# Patient Record
Sex: Female | Born: 1955
Health system: Southern US, Community
[De-identification: ages and names within clinical notes are randomized; demographics above are authoritative.]

## PROBLEM LIST (undated history)

## (undated) DIAGNOSIS — R112 Nausea with vomiting, unspecified: Secondary | ICD-10-CM

## (undated) DIAGNOSIS — Z803 Family history of malignant neoplasm of breast: Secondary | ICD-10-CM

## (undated) DIAGNOSIS — L409 Psoriasis, unspecified: Secondary | ICD-10-CM

## (undated) DIAGNOSIS — Z973 Presence of spectacles and contact lenses: Secondary | ICD-10-CM

## (undated) DIAGNOSIS — M1990 Unspecified osteoarthritis, unspecified site: Secondary | ICD-10-CM

## (undated) DIAGNOSIS — L03113 Cellulitis of right upper limb: Secondary | ICD-10-CM

## (undated) DIAGNOSIS — Z87410 Personal history of cervical dysplasia: Secondary | ICD-10-CM

## (undated) DIAGNOSIS — Z9889 Other specified postprocedural states: Secondary | ICD-10-CM

## (undated) DIAGNOSIS — Z808 Family history of malignant neoplasm of other organs or systems: Secondary | ICD-10-CM

## (undated) DIAGNOSIS — N83209 Unspecified ovarian cyst, unspecified side: Secondary | ICD-10-CM

## (undated) DIAGNOSIS — R011 Cardiac murmur, unspecified: Secondary | ICD-10-CM

## (undated) DIAGNOSIS — Z872 Personal history of diseases of the skin and subcutaneous tissue: Secondary | ICD-10-CM

## (undated) DIAGNOSIS — Z923 Personal history of irradiation: Secondary | ICD-10-CM

## (undated) DIAGNOSIS — N840 Polyp of corpus uteri: Secondary | ICD-10-CM

## (undated) DIAGNOSIS — Z9221 Personal history of antineoplastic chemotherapy: Secondary | ICD-10-CM

## (undated) DIAGNOSIS — Z86018 Personal history of other benign neoplasm: Secondary | ICD-10-CM

## (undated) DIAGNOSIS — C50811 Malignant neoplasm of overlapping sites of right female breast: Secondary | ICD-10-CM

## (undated) DIAGNOSIS — T4145XA Adverse effect of unspecified anesthetic, initial encounter: Secondary | ICD-10-CM

## (undated) DIAGNOSIS — Z170 Estrogen receptor positive status [ER+]: Secondary | ICD-10-CM

## (undated) DIAGNOSIS — I890 Lymphedema, not elsewhere classified: Secondary | ICD-10-CM

## (undated) DIAGNOSIS — D352 Benign neoplasm of pituitary gland: Secondary | ICD-10-CM

## (undated) DIAGNOSIS — I10 Essential (primary) hypertension: Secondary | ICD-10-CM

## (undated) HISTORY — PX: LAPAROSCOPIC CHOLECYSTECTOMY: SUR755

## (undated) HISTORY — PX: COLPOSCOPY: SHX161

## (undated) HISTORY — PX: CHOLECYSTECTOMY: SHX55

## (undated) HISTORY — PX: TONSILLECTOMY: SUR1361

## (undated) HISTORY — DX: Unspecified ovarian cyst, unspecified side: N83.209

## (undated) HISTORY — DX: Family history of malignant neoplasm of breast: Z80.3

## (undated) HISTORY — PX: CERVICAL CONIZATION W/BX: SHX1330

## (undated) HISTORY — PX: MODIFIED RADICAL MASTECTOMY W/ AXILLARY LYMPH NODE DISSECTION: SHX2042

## (undated) HISTORY — DX: Benign neoplasm of pituitary gland: D35.2

## (undated) HISTORY — PX: OTHER SURGICAL HISTORY: SHX169

## (undated) HISTORY — PX: CERVICAL CONE BIOPSY: SUR198

## (undated) HISTORY — DX: Family history of malignant neoplasm of other organs or systems: Z80.8

## (undated) HISTORY — DX: Essential (primary) hypertension: I10

## (undated) HISTORY — PX: BREAST SURGERY: SHX581

## (undated) HISTORY — PX: ANAL FISSURE REPAIR: SHX2312

---

## 1997-11-15 ENCOUNTER — Inpatient Hospital Stay (HOSPITAL_COMMUNITY): Admission: AD | Admit: 1997-11-15 | Discharge: 1997-11-15 | Payer: Self-pay | Admitting: Gynecology

## 1997-12-21 ENCOUNTER — Encounter (HOSPITAL_COMMUNITY): Admission: RE | Admit: 1997-12-21 | Discharge: 1998-01-01 | Payer: Self-pay | Admitting: Gynecology

## 1998-01-01 ENCOUNTER — Inpatient Hospital Stay (HOSPITAL_COMMUNITY): Admission: AD | Admit: 1998-01-01 | Discharge: 1998-01-03 | Payer: Self-pay | Admitting: Gynecology

## 1998-01-07 ENCOUNTER — Encounter (HOSPITAL_COMMUNITY): Admission: RE | Admit: 1998-01-07 | Discharge: 1998-04-07 | Payer: Self-pay | Admitting: Gynecology

## 1999-05-04 ENCOUNTER — Other Ambulatory Visit: Admission: RE | Admit: 1999-05-04 | Discharge: 1999-05-04 | Payer: Self-pay | Admitting: Obstetrics and Gynecology

## 2000-05-17 ENCOUNTER — Other Ambulatory Visit: Admission: RE | Admit: 2000-05-17 | Discharge: 2000-05-17 | Payer: Self-pay | Admitting: Obstetrics and Gynecology

## 2001-05-20 ENCOUNTER — Other Ambulatory Visit: Admission: RE | Admit: 2001-05-20 | Discharge: 2001-05-20 | Payer: Self-pay | Admitting: Obstetrics and Gynecology

## 2001-06-05 ENCOUNTER — Encounter: Admission: RE | Admit: 2001-06-05 | Discharge: 2001-09-03 | Payer: Self-pay | Admitting: Obstetrics and Gynecology

## 2002-05-28 ENCOUNTER — Other Ambulatory Visit: Admission: RE | Admit: 2002-05-28 | Discharge: 2002-05-28 | Payer: Self-pay | Admitting: Obstetrics and Gynecology

## 2002-10-10 ENCOUNTER — Ambulatory Visit (HOSPITAL_COMMUNITY): Admission: RE | Admit: 2002-10-10 | Discharge: 2002-10-10 | Payer: Self-pay | Admitting: Orthopedic Surgery

## 2003-06-02 ENCOUNTER — Other Ambulatory Visit: Admission: RE | Admit: 2003-06-02 | Discharge: 2003-06-02 | Payer: Self-pay | Admitting: Obstetrics and Gynecology

## 2004-02-03 ENCOUNTER — Encounter: Admission: RE | Admit: 2004-02-03 | Discharge: 2004-02-03 | Payer: Self-pay | Admitting: Emergency Medicine

## 2004-02-18 ENCOUNTER — Ambulatory Visit (HOSPITAL_COMMUNITY): Admission: RE | Admit: 2004-02-18 | Discharge: 2004-02-18 | Payer: Self-pay | Admitting: Emergency Medicine

## 2004-02-18 ENCOUNTER — Encounter (INDEPENDENT_AMBULATORY_CARE_PROVIDER_SITE_OTHER): Payer: Self-pay | Admitting: *Deleted

## 2004-03-17 ENCOUNTER — Encounter: Admission: RE | Admit: 2004-03-17 | Discharge: 2004-03-17 | Payer: Self-pay | Admitting: Emergency Medicine

## 2004-06-10 ENCOUNTER — Other Ambulatory Visit: Admission: RE | Admit: 2004-06-10 | Discharge: 2004-06-10 | Payer: Self-pay | Admitting: Obstetrics and Gynecology

## 2005-06-29 ENCOUNTER — Other Ambulatory Visit: Admission: RE | Admit: 2005-06-29 | Discharge: 2005-06-29 | Payer: Self-pay | Admitting: Obstetrics and Gynecology

## 2006-07-06 ENCOUNTER — Other Ambulatory Visit: Admission: RE | Admit: 2006-07-06 | Discharge: 2006-07-06 | Payer: Self-pay | Admitting: Obstetrics and Gynecology

## 2007-04-04 ENCOUNTER — Encounter: Admission: RE | Admit: 2007-04-04 | Discharge: 2007-07-02 | Payer: Self-pay | Admitting: Emergency Medicine

## 2007-08-14 ENCOUNTER — Other Ambulatory Visit: Admission: RE | Admit: 2007-08-14 | Discharge: 2007-08-14 | Payer: Self-pay | Admitting: Obstetrics and Gynecology

## 2007-09-27 ENCOUNTER — Ambulatory Visit (HOSPITAL_COMMUNITY): Admission: RE | Admit: 2007-09-27 | Discharge: 2007-09-27 | Payer: Self-pay | Admitting: Emergency Medicine

## 2007-11-07 ENCOUNTER — Ambulatory Visit: Payer: Self-pay | Admitting: Oncology

## 2007-11-13 LAB — CBC WITH DIFFERENTIAL/PLATELET
Eosinophils Absolute: 0.1 10*3/uL (ref 0.0–0.5)
MONO#: 0.7 10*3/uL (ref 0.1–0.9)
NEUT#: 7.7 10*3/uL — ABNORMAL HIGH (ref 1.5–6.5)
RBC: 4.69 10*6/uL (ref 3.70–5.32)
RDW: 12.7 % (ref 11.3–14.5)
WBC: 11.7 10*3/uL — ABNORMAL HIGH (ref 3.9–10.0)

## 2007-11-14 LAB — COMPREHENSIVE METABOLIC PANEL
Albumin: 4.5 g/dL (ref 3.5–5.2)
CO2: 27 mEq/L (ref 19–32)
Glucose, Bld: 98 mg/dL (ref 70–99)
Potassium: 3.7 mEq/L (ref 3.5–5.3)
Sodium: 140 mEq/L (ref 135–145)
Total Protein: 7.5 g/dL (ref 6.0–8.3)

## 2007-11-14 LAB — CANCER ANTIGEN 27.29: CA 27.29: 22 U/mL (ref 0–39)

## 2007-11-14 LAB — LACTATE DEHYDROGENASE: LDH: 125 U/L (ref 94–250)

## 2007-11-19 ENCOUNTER — Ambulatory Visit (HOSPITAL_COMMUNITY): Admission: RE | Admit: 2007-11-19 | Discharge: 2007-11-19 | Payer: Self-pay | Admitting: Oncology

## 2007-11-21 LAB — VITAMIN D 1,25 DIHYDROXY: Vit D, 1,25-Dihydroxy: 49 pg/mL (ref 15–75)

## 2007-11-22 ENCOUNTER — Ambulatory Visit (HOSPITAL_COMMUNITY): Admission: RE | Admit: 2007-11-22 | Discharge: 2007-11-22 | Payer: Self-pay | Admitting: Oncology

## 2007-11-27 ENCOUNTER — Encounter (INDEPENDENT_AMBULATORY_CARE_PROVIDER_SITE_OTHER): Payer: Self-pay | Admitting: Surgery

## 2007-11-27 ENCOUNTER — Ambulatory Visit (HOSPITAL_COMMUNITY): Admission: RE | Admit: 2007-11-27 | Discharge: 2007-11-29 | Payer: Self-pay | Admitting: Surgery

## 2007-12-19 ENCOUNTER — Ambulatory Visit: Payer: Self-pay | Admitting: Oncology

## 2007-12-24 ENCOUNTER — Ambulatory Visit: Admission: RE | Admit: 2007-12-24 | Discharge: 2007-12-24 | Payer: Self-pay | Admitting: Oncology

## 2007-12-24 ENCOUNTER — Encounter: Payer: Self-pay | Admitting: Oncology

## 2008-01-06 LAB — COMPREHENSIVE METABOLIC PANEL
BUN: 15 mg/dL (ref 6–23)
CO2: 26 mEq/L (ref 19–32)
Calcium: 9.4 mg/dL (ref 8.4–10.5)
Chloride: 102 mEq/L (ref 96–112)
Creatinine, Ser: 0.65 mg/dL (ref 0.40–1.20)
Glucose, Bld: 137 mg/dL — ABNORMAL HIGH (ref 70–99)

## 2008-01-06 LAB — CBC WITH DIFFERENTIAL/PLATELET
BASO%: 1 % (ref 0.0–2.0)
Basophils Absolute: 0.1 10*3/uL (ref 0.0–0.1)
HCT: 38.6 % (ref 34.8–46.6)
HGB: 13.2 g/dL (ref 11.6–15.9)
MONO#: 0.6 10*3/uL (ref 0.1–0.9)
NEUT%: 54.2 % (ref 39.6–76.8)
WBC: 9 10*3/uL (ref 3.9–10.0)
lymph#: 2.9 10*3/uL (ref 0.9–3.3)

## 2008-01-14 LAB — COMPREHENSIVE METABOLIC PANEL
AST: 10 U/L (ref 0–37)
Albumin: 4.2 g/dL (ref 3.5–5.2)
BUN: 11 mg/dL (ref 6–23)
CO2: 29 mEq/L (ref 19–32)
Calcium: 9.4 mg/dL (ref 8.4–10.5)
Chloride: 98 mEq/L (ref 96–112)
Creatinine, Ser: 0.81 mg/dL (ref 0.40–1.20)
Glucose, Bld: 95 mg/dL (ref 70–99)

## 2008-01-14 LAB — CBC WITH DIFFERENTIAL/PLATELET
Basophils Absolute: 0 10*3/uL (ref 0.0–0.1)
EOS%: 3.3 % (ref 0.0–7.0)
Eosinophils Absolute: 0.2 10*3/uL (ref 0.0–0.5)
HCT: 39 % (ref 34.8–46.6)
HGB: 13.2 g/dL (ref 11.6–15.9)
MCH: 29.5 pg (ref 26.0–34.0)
MCV: 87.1 fL (ref 81.0–101.0)
NEUT#: 4.2 10*3/uL (ref 1.5–6.5)
NEUT%: 58.9 % (ref 39.6–76.8)
lymph#: 2.4 10*3/uL (ref 0.9–3.3)

## 2008-01-20 LAB — CBC WITH DIFFERENTIAL/PLATELET
Basophils Absolute: 0.1 10*3/uL (ref 0.0–0.1)
Eosinophils Absolute: 0 10*3/uL (ref 0.0–0.5)
HCT: 37.3 % (ref 34.8–46.6)
HGB: 13 g/dL (ref 11.6–15.9)
LYMPH%: 20.3 % (ref 14.0–48.0)
MONO#: 0.6 10*3/uL (ref 0.1–0.9)
NEUT#: 7.9 10*3/uL — ABNORMAL HIGH (ref 1.5–6.5)
Platelets: 203 10*3/uL (ref 145–400)
RBC: 4.38 10*6/uL (ref 3.70–5.32)
WBC: 10.7 10*3/uL — ABNORMAL HIGH (ref 3.9–10.0)

## 2008-01-20 LAB — COMPREHENSIVE METABOLIC PANEL
Albumin: 4 g/dL (ref 3.5–5.2)
CO2: 26 mEq/L (ref 19–32)
Glucose, Bld: 110 mg/dL — ABNORMAL HIGH (ref 70–99)
Potassium: 4 mEq/L (ref 3.5–5.3)
Sodium: 138 mEq/L (ref 135–145)
Total Bilirubin: 0.1 mg/dL — ABNORMAL LOW (ref 0.3–1.2)
Total Protein: 6.3 g/dL (ref 6.0–8.3)

## 2008-01-27 LAB — CBC WITH DIFFERENTIAL/PLATELET
Basophils Absolute: 0.1 10*3/uL (ref 0.0–0.1)
EOS%: 1.4 % (ref 0.0–7.0)
Eosinophils Absolute: 0.1 10*3/uL (ref 0.0–0.5)
HGB: 12.7 g/dL (ref 11.6–15.9)
NEUT#: 2.8 10*3/uL (ref 1.5–6.5)
RDW: 13.8 % (ref 11.3–14.5)
lymph#: 1.5 10*3/uL (ref 0.9–3.3)

## 2008-01-27 LAB — COMPREHENSIVE METABOLIC PANEL
AST: 9 U/L (ref 0–37)
Albumin: 4.1 g/dL (ref 3.5–5.2)
BUN: 17 mg/dL (ref 6–23)
Calcium: 9.2 mg/dL (ref 8.4–10.5)
Chloride: 96 mEq/L (ref 96–112)
Glucose, Bld: 100 mg/dL — ABNORMAL HIGH (ref 70–99)
Potassium: 4.3 mEq/L (ref 3.5–5.3)

## 2008-01-29 ENCOUNTER — Ambulatory Visit: Payer: Self-pay | Admitting: Oncology

## 2008-02-03 LAB — CBC WITH DIFFERENTIAL/PLATELET
BASO%: 0.9 % (ref 0.0–2.0)
Basophils Absolute: 0.1 10*3/uL (ref 0.0–0.1)
EOS%: 0.2 % (ref 0.0–7.0)
HGB: 12.2 g/dL (ref 11.6–15.9)
MCH: 29.8 pg (ref 26.0–34.0)
RBC: 4.1 10*6/uL (ref 3.70–5.32)
RDW: 15 % — ABNORMAL HIGH (ref 11.3–14.5)
lymph#: 2 10*3/uL (ref 0.9–3.3)

## 2008-02-10 LAB — CBC WITH DIFFERENTIAL/PLATELET
Basophils Absolute: 0 10*3/uL (ref 0.0–0.1)
Eosinophils Absolute: 0 10*3/uL (ref 0.0–0.5)
HGB: 12.2 g/dL (ref 11.6–15.9)
MCV: 85 fL (ref 81.0–101.0)
MONO#: 0.3 10*3/uL (ref 0.1–0.9)
NEUT#: 3.4 10*3/uL (ref 1.5–6.5)
RDW: 15.9 % — ABNORMAL HIGH (ref 11.3–14.5)
WBC: 4.8 10*3/uL (ref 3.9–10.0)
lymph#: 1 10*3/uL (ref 0.9–3.3)

## 2008-02-10 LAB — COMPREHENSIVE METABOLIC PANEL
Albumin: 4 g/dL (ref 3.5–5.2)
BUN: 17 mg/dL (ref 6–23)
CO2: 29 mEq/L (ref 19–32)
Calcium: 8.9 mg/dL (ref 8.4–10.5)
Chloride: 97 mEq/L (ref 96–112)
Glucose, Bld: 103 mg/dL — ABNORMAL HIGH (ref 70–99)
Potassium: 4 mEq/L (ref 3.5–5.3)
Sodium: 134 mEq/L — ABNORMAL LOW (ref 135–145)
Total Protein: 6.3 g/dL (ref 6.0–8.3)

## 2008-02-17 LAB — CBC WITH DIFFERENTIAL/PLATELET
Basophils Absolute: 0.1 10*3/uL (ref 0.0–0.1)
HCT: 36.8 % (ref 34.8–46.6)
HGB: 12.5 g/dL (ref 11.6–15.9)
MCH: 29.3 pg (ref 26.0–34.0)
MONO#: 0.7 10*3/uL (ref 0.1–0.9)
NEUT%: 82.6 % — ABNORMAL HIGH (ref 39.6–76.8)
WBC: 11.7 10*3/uL — ABNORMAL HIGH (ref 3.9–10.0)
lymph#: 1.2 10*3/uL (ref 0.9–3.3)

## 2008-02-24 LAB — CBC WITH DIFFERENTIAL/PLATELET
BASO%: 0.9 % (ref 0.0–2.0)
Eosinophils Absolute: 0 10*3/uL (ref 0.0–0.5)
LYMPH%: 25 % (ref 14.0–48.0)
MCHC: 35 g/dL (ref 32.0–36.0)
MCV: 85.4 fL (ref 81.0–101.0)
MONO%: 7.6 % (ref 0.0–13.0)
Platelets: 295 10*3/uL (ref 145–400)
RBC: 3.81 10*6/uL (ref 3.70–5.32)

## 2008-02-24 LAB — COMPREHENSIVE METABOLIC PANEL
Albumin: 3.9 g/dL (ref 3.5–5.2)
BUN: 17 mg/dL (ref 6–23)
CO2: 26 mEq/L (ref 19–32)
Calcium: 8.6 mg/dL (ref 8.4–10.5)
Chloride: 98 mEq/L (ref 96–112)
Glucose, Bld: 98 mg/dL (ref 70–99)
Potassium: 4.1 mEq/L (ref 3.5–5.3)
Sodium: 136 mEq/L (ref 135–145)
Total Protein: 6 g/dL (ref 6.0–8.3)

## 2008-03-02 LAB — COMPREHENSIVE METABOLIC PANEL
ALT: 18 U/L (ref 0–35)
AST: 10 U/L (ref 0–37)
Albumin: 3.7 g/dL (ref 3.5–5.2)
Alkaline Phosphatase: 144 U/L — ABNORMAL HIGH (ref 39–117)
BUN: 22 mg/dL (ref 6–23)
Chloride: 102 mEq/L (ref 96–112)
Potassium: 3.6 mEq/L (ref 3.5–5.3)
Sodium: 138 mEq/L (ref 135–145)
Total Protein: 6 g/dL (ref 6.0–8.3)

## 2008-03-02 LAB — CBC WITH DIFFERENTIAL/PLATELET
BASO%: 0.2 % (ref 0.0–2.0)
Basophils Absolute: 0 10*3/uL (ref 0.0–0.1)
EOS%: 0.1 % (ref 0.0–7.0)
HGB: 11.2 g/dL — ABNORMAL LOW (ref 11.6–15.9)
MCH: 29.6 pg (ref 26.0–34.0)
MCHC: 34.3 g/dL (ref 32.0–36.0)
MCV: 86.1 fL (ref 81.0–101.0)
MONO%: 6.4 % (ref 0.0–13.0)
RDW: 19.6 % — ABNORMAL HIGH (ref 11.3–14.5)

## 2008-03-06 ENCOUNTER — Ambulatory Visit: Payer: Self-pay | Admitting: Oncology

## 2008-03-06 LAB — CBC WITH DIFFERENTIAL/PLATELET
EOS%: 0.2 % (ref 0.0–7.0)
Eosinophils Absolute: 0 10*3/uL (ref 0.0–0.5)
MCH: 29.5 pg (ref 26.0–34.0)
MCV: 86.3 fL (ref 81.0–101.0)
MONO%: 4.7 % (ref 0.0–13.0)
NEUT#: 4.1 10*3/uL (ref 1.5–6.5)
RBC: 4.3 10*6/uL (ref 3.70–5.32)
RDW: 19.6 % — ABNORMAL HIGH (ref 11.3–14.5)

## 2008-03-16 LAB — CBC WITH DIFFERENTIAL/PLATELET
Eosinophils Absolute: 0.2 10*3/uL (ref 0.0–0.5)
MONO#: 0.5 10*3/uL (ref 0.1–0.9)
NEUT#: 3.9 10*3/uL (ref 1.5–6.5)
RBC: 3.82 10*6/uL (ref 3.70–5.32)
RDW: 21.8 % — ABNORMAL HIGH (ref 11.3–14.5)
WBC: 5.9 10*3/uL (ref 3.9–10.0)

## 2008-03-23 LAB — CBC WITH DIFFERENTIAL/PLATELET
BASO%: 1.1 % (ref 0.0–2.0)
EOS%: 3.1 % (ref 0.0–7.0)
HGB: 11.1 g/dL — ABNORMAL LOW (ref 11.6–15.9)
MCH: 30.3 pg (ref 26.0–34.0)
MCHC: 34.7 g/dL (ref 32.0–36.0)
RDW: 19 % — ABNORMAL HIGH (ref 11.3–14.5)
lymph#: 1.5 10*3/uL (ref 0.9–3.3)

## 2008-03-31 LAB — CBC WITH DIFFERENTIAL/PLATELET
Basophils Absolute: 0.1 10*3/uL (ref 0.0–0.1)
Eosinophils Absolute: 0.2 10*3/uL (ref 0.0–0.5)
HCT: 35.2 % (ref 34.8–46.6)
HGB: 12.4 g/dL (ref 11.6–15.9)
LYMPH%: 23.8 % (ref 14.0–48.0)
MCV: 87.4 fL (ref 81.0–101.0)
MONO%: 7.7 % (ref 0.0–13.0)
NEUT#: 4.3 10*3/uL (ref 1.5–6.5)
Platelets: 322 10*3/uL (ref 145–400)

## 2008-04-06 LAB — CBC WITH DIFFERENTIAL/PLATELET
Basophils Absolute: 0.1 10*3/uL (ref 0.0–0.1)
EOS%: 2.4 % (ref 0.0–7.0)
Eosinophils Absolute: 0.1 10*3/uL (ref 0.0–0.5)
HCT: 36.8 % (ref 34.8–46.6)
MCHC: 35.3 g/dL (ref 32.0–36.0)
MCV: 87 fL (ref 81.0–101.0)
Platelets: 327 10*3/uL (ref 145–400)
RBC: 4.23 10*6/uL (ref 3.70–5.32)

## 2008-04-13 LAB — CBC WITH DIFFERENTIAL/PLATELET
Basophils Absolute: 0.1 10*3/uL (ref 0.0–0.1)
Eosinophils Absolute: 0.2 10*3/uL (ref 0.0–0.5)
HCT: 33.7 % — ABNORMAL LOW (ref 34.8–46.6)
HGB: 12.1 g/dL (ref 11.6–15.9)
MCV: 89.7 fL (ref 81.0–101.0)
NEUT#: 4.2 10*3/uL (ref 1.5–6.5)
RDW: 17.3 % — ABNORMAL HIGH (ref 11.3–14.5)
lymph#: 1.6 10*3/uL (ref 0.9–3.3)

## 2008-04-20 LAB — CBC WITH DIFFERENTIAL/PLATELET
Basophils Absolute: 0.1 10*3/uL (ref 0.0–0.1)
Eosinophils Absolute: 0.1 10*3/uL (ref 0.0–0.5)
HGB: 12.3 g/dL (ref 11.6–15.9)
LYMPH%: 28.7 % (ref 14.0–48.0)
MCV: 90.4 fL (ref 81.0–101.0)
MONO%: 5.8 % (ref 0.0–13.0)
NEUT#: 3.6 10*3/uL (ref 1.5–6.5)
NEUT%: 61.4 % (ref 39.6–76.8)
Platelets: 326 10*3/uL (ref 145–400)

## 2008-04-23 ENCOUNTER — Ambulatory Visit: Payer: Self-pay | Admitting: Oncology

## 2008-04-27 LAB — CBC WITH DIFFERENTIAL/PLATELET
Basophils Absolute: 0.1 10*3/uL (ref 0.0–0.1)
EOS%: 2 % (ref 0.0–7.0)
Eosinophils Absolute: 0.1 10*3/uL (ref 0.0–0.5)
HGB: 12.1 g/dL (ref 11.6–15.9)
MONO#: 0.4 10*3/uL (ref 0.1–0.9)
NEUT#: 3.5 10*3/uL (ref 1.5–6.5)
RDW: 15 % — ABNORMAL HIGH (ref 11.3–14.5)
WBC: 5.6 10*3/uL (ref 3.9–10.0)
lymph#: 1.5 10*3/uL (ref 0.9–3.3)

## 2008-05-04 LAB — CBC WITH DIFFERENTIAL/PLATELET
Basophils Absolute: 0.1 10*3/uL (ref 0.0–0.1)
Eosinophils Absolute: 0.1 10*3/uL (ref 0.0–0.5)
HGB: 12.3 g/dL (ref 11.6–15.9)
MCV: 92.7 fL (ref 81.0–101.0)
MONO%: 8.1 % (ref 0.0–13.0)
NEUT#: 4 10*3/uL (ref 1.5–6.5)
Platelets: 372 10*3/uL (ref 145–400)
RDW: 15 % — ABNORMAL HIGH (ref 11.3–14.5)

## 2008-05-08 ENCOUNTER — Ambulatory Visit: Admission: RE | Admit: 2008-05-08 | Discharge: 2008-07-23 | Payer: Self-pay | Admitting: Radiation Oncology

## 2008-05-11 LAB — CBC WITH DIFFERENTIAL/PLATELET
Basophils Absolute: 0.1 10*3/uL (ref 0.0–0.1)
Eosinophils Absolute: 0.1 10*3/uL (ref 0.0–0.5)
LYMPH%: 25 % (ref 14.0–48.0)
MCV: 92.7 fL (ref 81.0–101.0)
MONO%: 7.3 % (ref 0.0–13.0)
NEUT#: 3.6 10*3/uL (ref 1.5–6.5)
NEUT%: 64.3 % (ref 39.6–76.8)
Platelets: 359 10*3/uL (ref 145–400)
RBC: 3.79 10*6/uL (ref 3.70–5.32)

## 2008-05-15 LAB — CBC WITH DIFFERENTIAL/PLATELET
Basophils Absolute: 0 10*3/uL (ref 0.0–0.1)
Eosinophils Absolute: 0.1 10*3/uL (ref 0.0–0.5)
HGB: 12.8 g/dL (ref 11.6–15.9)
MCV: 92.6 fL (ref 81.0–101.0)
MONO#: 0.2 10*3/uL (ref 0.1–0.9)
NEUT#: 2.4 10*3/uL (ref 1.5–6.5)
RDW: 13.9 % (ref 11.3–14.5)
lymph#: 1.2 10*3/uL (ref 0.9–3.3)

## 2008-06-19 ENCOUNTER — Ambulatory Visit: Payer: Self-pay | Admitting: Oncology

## 2008-06-23 LAB — CBC WITH DIFFERENTIAL/PLATELET
Basophils Absolute: 0 10*3/uL (ref 0.0–0.1)
Eosinophils Absolute: 0.2 10*3/uL (ref 0.0–0.5)
HCT: 38.4 % (ref 34.8–46.6)
HGB: 13.5 g/dL (ref 11.6–15.9)
MCV: 93.8 fL (ref 81.0–101.0)
NEUT#: 3.7 10*3/uL (ref 1.5–6.5)
RDW: 14.7 % — ABNORMAL HIGH (ref 11.3–14.5)
lymph#: 0.9 10*3/uL (ref 0.9–3.3)

## 2008-06-23 LAB — COMPREHENSIVE METABOLIC PANEL
Albumin: 4.1 g/dL (ref 3.5–5.2)
BUN: 17 mg/dL (ref 6–23)
Calcium: 9.2 mg/dL (ref 8.4–10.5)
Chloride: 104 mEq/L (ref 96–112)
Glucose, Bld: 96 mg/dL (ref 70–99)
Potassium: 4.1 mEq/L (ref 3.5–5.3)

## 2008-06-25 ENCOUNTER — Ambulatory Visit (HOSPITAL_COMMUNITY): Admission: RE | Admit: 2008-06-25 | Discharge: 2008-06-25 | Payer: Self-pay | Admitting: Oncology

## 2008-07-08 ENCOUNTER — Encounter: Admission: RE | Admit: 2008-07-08 | Discharge: 2008-08-20 | Payer: Self-pay | Admitting: Oncology

## 2008-07-09 ENCOUNTER — Ambulatory Visit: Payer: Self-pay | Admitting: Obstetrics and Gynecology

## 2008-07-29 ENCOUNTER — Ambulatory Visit: Admission: RE | Admit: 2008-07-29 | Discharge: 2008-07-29 | Payer: Self-pay | Admitting: Radiation Oncology

## 2008-08-14 ENCOUNTER — Other Ambulatory Visit: Admission: RE | Admit: 2008-08-14 | Discharge: 2008-08-14 | Payer: Self-pay | Admitting: Obstetrics and Gynecology

## 2008-08-14 ENCOUNTER — Encounter: Payer: Self-pay | Admitting: Obstetrics and Gynecology

## 2008-08-14 ENCOUNTER — Ambulatory Visit: Payer: Self-pay | Admitting: Obstetrics and Gynecology

## 2008-09-01 ENCOUNTER — Ambulatory Visit: Payer: Self-pay | Admitting: Oncology

## 2008-09-07 ENCOUNTER — Ambulatory Visit (HOSPITAL_COMMUNITY): Admission: RE | Admit: 2008-09-07 | Discharge: 2008-09-07 | Payer: Self-pay | Admitting: Surgery

## 2008-09-07 ENCOUNTER — Encounter (INDEPENDENT_AMBULATORY_CARE_PROVIDER_SITE_OTHER): Payer: Self-pay | Admitting: Surgery

## 2008-09-21 ENCOUNTER — Ambulatory Visit: Payer: Self-pay | Admitting: Obstetrics and Gynecology

## 2008-10-05 ENCOUNTER — Ambulatory Visit: Payer: Self-pay | Admitting: Obstetrics and Gynecology

## 2008-11-30 ENCOUNTER — Ambulatory Visit (HOSPITAL_COMMUNITY): Admission: RE | Admit: 2008-11-30 | Discharge: 2008-11-30 | Payer: Self-pay | Admitting: Oncology

## 2008-12-02 ENCOUNTER — Ambulatory Visit: Payer: Self-pay | Admitting: Obstetrics and Gynecology

## 2008-12-03 ENCOUNTER — Ambulatory Visit: Payer: Self-pay | Admitting: Oncology

## 2008-12-15 LAB — CBC WITH DIFFERENTIAL/PLATELET
BASO%: 0.3 % (ref 0.0–2.0)
Basophils Absolute: 0 10*3/uL (ref 0.0–0.1)
EOS%: 2.9 % (ref 0.0–7.0)
HCT: 39.7 % (ref 34.8–46.6)
HGB: 13.7 g/dL (ref 11.6–15.9)
LYMPH%: 23.5 % (ref 14.0–49.7)
MCH: 31.9 pg (ref 25.1–34.0)
MCHC: 34.6 g/dL (ref 31.5–36.0)
MCV: 92.4 fL (ref 79.5–101.0)
MONO%: 8.9 % (ref 0.0–14.0)
NEUT%: 64.4 % (ref 38.4–76.8)
lymph#: 1.2 10*3/uL (ref 0.9–3.3)

## 2008-12-15 LAB — COMPREHENSIVE METABOLIC PANEL
AST: 18 U/L (ref 0–37)
Albumin: 3.4 g/dL — ABNORMAL LOW (ref 3.5–5.2)
Alkaline Phosphatase: 76 U/L (ref 39–117)
BUN: 11 mg/dL (ref 6–23)
Creatinine, Ser: 0.65 mg/dL (ref 0.40–1.20)
Glucose, Bld: 90 mg/dL (ref 70–99)
Potassium: 3.5 mEq/L (ref 3.5–5.3)
Total Bilirubin: 0.7 mg/dL (ref 0.3–1.2)

## 2009-04-15 ENCOUNTER — Ambulatory Visit: Payer: Self-pay | Admitting: Oncology

## 2009-04-15 ENCOUNTER — Ambulatory Visit: Payer: Self-pay | Admitting: Obstetrics and Gynecology

## 2009-04-20 LAB — RESEARCH LABS

## 2009-04-22 LAB — COMPREHENSIVE METABOLIC PANEL
ALT: 18 U/L (ref 0–35)
Albumin: 3.5 g/dL (ref 3.5–5.2)
CO2: 30 mEq/L (ref 19–32)
Calcium: 8.9 mg/dL (ref 8.4–10.5)
Chloride: 100 mEq/L (ref 96–112)
Glucose, Bld: 101 mg/dL — ABNORMAL HIGH (ref 70–99)
Sodium: 137 mEq/L (ref 135–145)
Total Protein: 6.3 g/dL (ref 6.0–8.3)

## 2009-04-22 LAB — CBC WITH DIFFERENTIAL/PLATELET
BASO%: 0.4 % (ref 0.0–2.0)
Eosinophils Absolute: 0.1 10*3/uL (ref 0.0–0.5)
HCT: 37.8 % (ref 34.8–46.6)
MCHC: 35.8 g/dL (ref 31.5–36.0)
MONO#: 0.5 10*3/uL (ref 0.1–0.9)
NEUT#: 4 10*3/uL (ref 1.5–6.5)
RBC: 4.11 10*6/uL (ref 3.70–5.45)
WBC: 6.5 10*3/uL (ref 3.9–10.3)
lymph#: 1.8 10*3/uL (ref 0.9–3.3)

## 2009-04-23 LAB — VITAMIN D 25 HYDROXY (VIT D DEFICIENCY, FRACTURES): Vit D, 25-Hydroxy: 36 ng/mL (ref 30–89)

## 2009-05-05 ENCOUNTER — Encounter: Admission: RE | Admit: 2009-05-05 | Discharge: 2009-05-31 | Payer: Self-pay | Admitting: Oncology

## 2009-08-16 ENCOUNTER — Encounter: Admission: RE | Admit: 2009-08-16 | Discharge: 2009-10-19 | Payer: Self-pay | Admitting: Oncology

## 2009-08-23 ENCOUNTER — Other Ambulatory Visit: Admission: RE | Admit: 2009-08-23 | Discharge: 2009-08-23 | Payer: Self-pay | Admitting: Obstetrics and Gynecology

## 2009-08-23 ENCOUNTER — Ambulatory Visit: Payer: Self-pay | Admitting: Obstetrics and Gynecology

## 2009-11-17 ENCOUNTER — Ambulatory Visit: Payer: Self-pay | Admitting: Oncology

## 2009-11-17 ENCOUNTER — Ambulatory Visit (HOSPITAL_COMMUNITY): Admission: RE | Admit: 2009-11-17 | Discharge: 2009-11-17 | Payer: Self-pay | Admitting: Oncology

## 2009-11-17 LAB — CBC WITH DIFFERENTIAL/PLATELET
EOS%: 2.6 % (ref 0.0–7.0)
HCT: 40.1 % (ref 34.8–46.6)
MCH: 32.4 pg (ref 25.1–34.0)
MCV: 93.6 fL (ref 79.5–101.0)
MONO#: 0.5 10*3/uL (ref 0.1–0.9)
MONO%: 8 % (ref 0.0–14.0)
NEUT#: 3.6 10*3/uL (ref 1.5–6.5)
RBC: 4.29 10*6/uL (ref 3.70–5.45)
WBC: 6.2 10*3/uL (ref 3.9–10.3)
lymph#: 1.9 10*3/uL (ref 0.9–3.3)

## 2009-11-17 LAB — COMPREHENSIVE METABOLIC PANEL
ALT: 22 U/L (ref 0–35)
Albumin: 3.6 g/dL (ref 3.5–5.2)
Alkaline Phosphatase: 53 U/L (ref 39–117)
BUN: 14 mg/dL (ref 6–23)
CO2: 29 mEq/L (ref 19–32)
Potassium: 3.7 mEq/L (ref 3.5–5.3)
Sodium: 137 mEq/L (ref 135–145)

## 2009-11-17 LAB — VITAMIN D 25 HYDROXY (VIT D DEFICIENCY, FRACTURES): Vit D, 25-Hydroxy: 38 ng/mL (ref 30–89)

## 2009-12-28 ENCOUNTER — Ambulatory Visit: Payer: Self-pay | Admitting: Oncology

## 2010-05-16 ENCOUNTER — Ambulatory Visit: Payer: Self-pay | Admitting: Obstetrics and Gynecology

## 2010-05-27 ENCOUNTER — Ambulatory Visit: Payer: Self-pay | Admitting: Oncology

## 2010-05-31 LAB — COMPREHENSIVE METABOLIC PANEL
AST: 29 U/L (ref 0–37)
BUN: 15 mg/dL (ref 6–23)
CO2: 30 mEq/L (ref 19–32)
Chloride: 92 mEq/L — ABNORMAL LOW (ref 96–112)
Glucose, Bld: 91 mg/dL (ref 70–99)
Potassium: 3.2 mEq/L — ABNORMAL LOW (ref 3.5–5.3)
Sodium: 133 mEq/L — ABNORMAL LOW (ref 135–145)
Total Bilirubin: 0.6 mg/dL (ref 0.3–1.2)
Total Protein: 6.4 g/dL (ref 6.0–8.3)

## 2010-05-31 LAB — CBC WITH DIFFERENTIAL/PLATELET
BASO%: 0.5 % (ref 0.0–2.0)
HCT: 38.9 % (ref 34.8–46.6)
MCV: 92.7 fL (ref 79.5–101.0)
MONO%: 8.5 % (ref 0.0–14.0)
NEUT#: 4.1 10*3/uL (ref 1.5–6.5)
Platelets: 237 10*3/uL (ref 145–400)
RBC: 4.2 10*6/uL (ref 3.70–5.45)
WBC: 6.8 10*3/uL (ref 3.9–10.3)

## 2010-05-31 LAB — CANCER ANTIGEN 27.29: CA 27.29: 23 U/mL (ref 0–39)

## 2010-08-14 ENCOUNTER — Encounter: Payer: Self-pay | Admitting: Oncology

## 2010-08-14 ENCOUNTER — Encounter: Payer: Self-pay | Admitting: Emergency Medicine

## 2010-09-14 ENCOUNTER — Other Ambulatory Visit (HOSPITAL_COMMUNITY)
Admission: RE | Admit: 2010-09-14 | Discharge: 2010-09-14 | Disposition: A | Discharge status: Home / Self Care | Payer: Commercial Managed Care - PPO | Source: Ambulatory Visit | Attending: Obstetrics and Gynecology | Admitting: Obstetrics and Gynecology

## 2010-09-14 ENCOUNTER — Other Ambulatory Visit: Payer: Self-pay | Admitting: Obstetrics and Gynecology

## 2010-09-14 ENCOUNTER — Encounter (INDEPENDENT_AMBULATORY_CARE_PROVIDER_SITE_OTHER): Payer: Commercial Managed Care - PPO | Admitting: Obstetrics and Gynecology

## 2010-09-14 DIAGNOSIS — Z01419 Encounter for gynecological examination (general) (routine) without abnormal findings: Secondary | ICD-10-CM

## 2010-09-14 DIAGNOSIS — N912 Amenorrhea, unspecified: Secondary | ICD-10-CM

## 2010-09-14 DIAGNOSIS — Z124 Encounter for screening for malignant neoplasm of cervix: Secondary | ICD-10-CM | POA: Insufficient documentation

## 2010-11-02 ENCOUNTER — Ambulatory Visit (HOSPITAL_COMMUNITY)
Admission: RE | Admit: 2010-11-02 | Discharge: 2010-11-02 | Disposition: A | Discharge status: Home / Self Care | Payer: 59 | Source: Ambulatory Visit | Attending: Oncology | Admitting: Oncology

## 2010-11-02 ENCOUNTER — Other Ambulatory Visit: Payer: Self-pay | Admitting: Oncology

## 2010-11-02 ENCOUNTER — Encounter: Payer: Commercial Managed Care - PPO | Admitting: Oncology

## 2010-11-02 DIAGNOSIS — Z9010 Acquired absence of unspecified breast and nipple: Secondary | ICD-10-CM | POA: Insufficient documentation

## 2010-11-02 DIAGNOSIS — Z853 Personal history of malignant neoplasm of breast: Secondary | ICD-10-CM | POA: Insufficient documentation

## 2010-11-02 DIAGNOSIS — C50919 Malignant neoplasm of unspecified site of unspecified female breast: Secondary | ICD-10-CM

## 2010-11-02 MED ORDER — GADOBENATE DIMEGLUMINE 529 MG/ML IV SOLN
20.0000 mL | Freq: Once | INTRAVENOUS | Status: AC | PRN
  Administered 2010-11-02: 20 mL via INTRAVENOUS

## 2010-11-08 LAB — URINALYSIS, ROUTINE W REFLEX MICROSCOPIC
Bilirubin Urine: NEGATIVE
Glucose, UA: NEGATIVE mg/dL
Ketones, ur: NEGATIVE mg/dL
Nitrite: NEGATIVE
Specific Gravity, Urine: 1.009 (ref 1.005–1.030)
pH: 5.5 (ref 5.0–8.0)

## 2010-12-06 ENCOUNTER — Other Ambulatory Visit: Payer: Self-pay | Admitting: Oncology

## 2010-12-06 ENCOUNTER — Encounter (HOSPITAL_BASED_OUTPATIENT_CLINIC_OR_DEPARTMENT_OTHER): Payer: Commercial Managed Care - PPO | Admitting: Oncology

## 2010-12-06 DIAGNOSIS — C50919 Malignant neoplasm of unspecified site of unspecified female breast: Secondary | ICD-10-CM

## 2010-12-06 DIAGNOSIS — Z170 Estrogen receptor positive status [ER+]: Secondary | ICD-10-CM

## 2010-12-06 LAB — COMPREHENSIVE METABOLIC PANEL
Alkaline Phosphatase: 78 U/L (ref 39–117)
Glucose, Bld: 89 mg/dL (ref 70–99)
Sodium: 138 mEq/L (ref 135–145)
Total Bilirubin: 0.1 mg/dL — ABNORMAL LOW (ref 0.3–1.2)
Total Protein: 6.2 g/dL (ref 6.0–8.3)

## 2010-12-06 LAB — CBC WITH DIFFERENTIAL/PLATELET
Eosinophils Absolute: 0.2 10*3/uL (ref 0.0–0.5)
LYMPH%: 30.7 % (ref 14.0–49.7)
MCH: 32.5 pg (ref 25.1–34.0)
MCHC: 34.8 g/dL (ref 31.5–36.0)
MCV: 93.2 fL (ref 79.5–101.0)
MONO%: 9.1 % (ref 0.0–14.0)
Platelets: 232 10*3/uL (ref 145–400)
RBC: 4.08 10*6/uL (ref 3.70–5.45)

## 2010-12-06 NOTE — Op Note (Signed)
NAMESEHAR, SEDANO               ACCOUNT NO.:  [id number]   MEDICAL RECORD NO.:  [id number]          PATIENT TYPE:  OIB   LOCATION:  5150                         FACILITY:  [facility]   PHYSICIAN:  Wilmon Arms. Corliss Skains, M.D. DATE OF BIRTH:  April 15, 1956   DATE OF PROCEDURE:  DATE OF DISCHARGE:                               OPERATIVE REPORT   PREOPERATIVE DIAGNOSIS:  Postoperative hematoma.   POSTOPERATIVE DIAGNOSIS:  Postoperative hematoma.   PROCEDURE:  Evacuation of hematoma from right mastectomy site.   SURGEON:  Wilmon Arms. Corliss Skains, M.D., FACS   ANESTHESIA:  General endotracheal.   INDICATIONS:  The patient is a 55 year old female who underwent a right  mastectomy and right axillary lymph node dissection by Dr. Daphine Deutscher on Nov 27, 2007.  During the evening, her Jackson-Pratt drain began draining,  significantly more output.  Hemoglobin at midnight showed a heme level  of 12.0 down from her preoperative level of 15.  We then came to see the  patient later and the drainage had continued at a fairly high rate.  This appeared to be a free blood and did not appear to be serous fluid.  The examination of wound showed what seemed to be a underlying hematoma.  Therefore, the decision was made to proceed back to the operating room  immediately.   DESCRIPTION OF PROCEDURE:  The patient was brought to the operating  room, placed in supine position on the operating table.  After an  adequate level of general anesthesia was obtained, her right chest was  prepped with Betadine and draped in sterile fashion.  A time-out was  taken to assure proper patient, proper procedure.  Her Jackson-Pratt  drain was removed.  The staples were removed.  The wound was opened by  removing the deep dermal interrupted 3-0 Vicryl sutures.  A large amount  of clot was manually scooped out of the mastectomy bed.  There seemed to  be diffuse oozing.  The pulse lavage was then used to irrigate out the  entire wound.  We  tried to remove as much clot as possible.  We then  spent the next hour meticulously inspecting every part of the surgical  site for any active bleeding.  There seemed to be a diffuse ooze.  We  cauterized many areas.  There were several oozing areas in the axilla,  which seemed to be the primary source of the ooze.  We thoroughly  cauterized this area.  No further bleeding was noted.  A new 19 Blake  drain was placed through the previous stab incision.  This was secured  with a 3-0 nylon suture.  The wound was reclosed with a deep dermal  layer of interrupted 3-0 Vicryl.  Staples were used to close the skin.  The bulb was placed to suction.  Dry dressing was applied.  The patient  was extubated and brought to recovery in stable condition.  All sponge,  instrument, and needle counts were correct.      Wilmon Arms. Tsuei, M.D.  Electronically Signed     MKT/MEDQ  D:  11/28/2007  T:  11/28/2007  Job:  [id number]

## 2010-12-06 NOTE — Op Note (Signed)
[name], [name]               ACCOUNT NO.:  [id number]   MEDICAL RECORD NO.:  [id number]          PATIENT TYPE:  OIB   LOCATION:  2550                         FACILITY:  [facility]   PHYSICIAN:  Thornton Park. Daphine Deutscher, MD  DATE OF BIRTH:  Apr 29, 1956   DATE OF PROCEDURE:  11/27/2007  DATE OF DISCHARGE:                               OPERATIVE REPORT   PREOPERATIVE DIAGNOSIS:  T3 N0 MX cancer in the breast - two areas in  different quadrants with lobular carcinoma and invasive ductal  carcinoma, previously core biopsied by Dr. Yolanda Bonine.  Negative scan of  the left breast and otherwise negative PET scan.  No palpable axillary  nodes and nothing lights up in the axilla.   POSTOPERATIVE DIAGNOSIS:  Status post the right mastectomy, 2 of 3  positive sentinel lymph nodes in the right axilla with axillary sampling  pending.  Status post left subclavian Port-A-Cath.   PROCEDURE:  Right axillary mapping, right modified mastectomy including  the sentinel lymph node biopsy and subsequent axillary dissection for  positive sentinel nodes, left subclavian Port-A-Cath.   SURGEON:  Luretha Murphy, M.D.   ASSISTANT:  None.   ANESTHESIA:  General endotracheal.   DESCRIPTION OF PROCEDURE:  [name] Gibson is a 55 year old white female  taken to [facility] operating room #16 on Wednesday Nov 27, 2007 and given  general anesthesia.  She received a gram of Ancef preop and first we  worked on the right breast.  They prepped with Betadine but I reprepped  with Techni-Care of the right breast and for just that portion of the  procedure only.  I had been previously marked the side and explained  mastectomy as well as axillary dissection and risk of lymphedema and  nerve pain and numbness.  I surveyed the breast and then described and  marked axillary flaps and I saw the little area which was either a mole  or her inferior biopsy site which I excised with a tiny ellipse and  closed with 4-0 Vicryl.  This was  subsequently used at the site for my  drain exit.   In the meantime, I went ahead and incised this ellipse and raised flaps,  dissecting with a Kelly bluntly beneath the skin to help discern the  breast from the fat and then I created superior and inferior flaps.  I  had previously mapped the axilla but did not really get a great hot  reading.  I elected to go ahead and do the sentinel lymph node biopsy as  a part of what was initially planned to be a total mastectomy.  As I  moved up and created flaps into the right axilla and the beginning of  the axillary tail of Spence, I marked an area that seemed hot and went  ahead and removed the breast off the chest wall.  As I swept up along  the pectoralis muscles we began to map and found a total of three lymph  nodes which I went ahead and sequentially excised and sent for just  sentinel lymph nodes.  These were sent to Dr. Laureen Ochs.  I had also injected  the subareolar region with methylene blue at the beginning of the case  but I did not notice a lot of blue dye in the axilla.  As a part of my  mapping, I did map the internal chain and had a little bit activity  midway down the sternum medially.   Three sentinel nodes were harvested and sent.  In the meantime I went  ahead and removed the axillary tail of Spence and entire specimen and  talked to Dr. Yolanda Bonine and elected since there were two separate foci of  cancer to send it over for specimen mammogram which would be helpful to  the pathologist to find the tumors.   I washed the area with water because I was afraid that these sentinel  nodes may be positive and after washing, I changed my gown and gloves.  When the report came back, I went up and identified the vein and the  thoracodorsal nerve and long thoracic nerve of Bell and preserved those.  Axillary contents were dissected free.  She was not paralyzed and I was  able to stimulate any kind of structure that looked like it could be  a  neurovascular bundle and make sure that nothing was involved.   Axillary contents were removed and sent and I then put a 19 Blake drain  in from the previously mentioned excision site in the lower flap.  The  wound was then closed with 3-0 and 4-0 Vicryl and also staples.   I then changed gown and gloves, prepped the left side and put the  patient's arms down by her side, took her off the ventilator and after  prepping with Techni-Care and draping sterilely, I hit the left  subclavian vein on first pass, threaded a wire which initially went up  in her neck and then I redirected that down under fluoro.  Power Port 8-  [place] device was used and this was not a Secretary/administrator.  I went  ahead and tunneled it and created a small pocket for the device and with  the wire in place, I used a C-arm to reposition the wire and then passed  the peel-away sheath.  The catheter was then threaded in about 20 cm and  then I watched it come back and it was in probably the right atrium  where I left it.  It was cut, connected, the locking device placed, and  I had previously placed three 2-0 Prolenes in the small pocket and these  were placed through the port device and then it was tucked away and tied  down.  I flushed it with concentrated heparin and then closed the pocket  with 4-0 and 5-0 Vicryl with Benzoin and Steri-Strips.  The patient  tolerated procedure well and was taken to the recovery room in  satisfactory condition.      Thornton Park Daphine Deutscher, MD  Electronically Signed     MBM/MEDQ  D:  11/27/2007  T:  11/27/2007  Job:  [id number]   cc:   Dr.Margaret Domingo Sep, M.D.  Fax: [phone]   Valentino Hue. Magrinat, M.D.  Fax: [phone]

## 2010-12-06 NOTE — Op Note (Signed)
NAMECHARLIEE, [name] Gibson               ACCOUNT NO.:  [id number]   MEDICAL RECORD NO.:  [id number]          PATIENT TYPE:  AMB   LOCATION:  DAY                          FACILITY:  [facility]   PHYSICIAN:  Thornton Park. Daphine Deutscher, MD  DATE OF BIRTH:  Apr 28, 1956   DATE OF PROCEDURE:  09/07/2008  DATE OF DISCHARGE:                               OPERATIVE REPORT   PREOPERATIVE DIAGNOSIS:  Biliary colic and gallstones, status post  chemotherapy for breast cancer with Port-A-Cath.   PROCEDURE:  Laparoscopic cholecystectomy with intraoperative  cholangiogram, removal of Port-A-Cath.   SURGEON:  Luretha Murphy, M.D.   ASSISTANT:  Jaclynn Guarneri, M.D.   ANESTHESIA:  General endotracheal.   FINDINGS:  Marked adhesions of the infundibulum of the gallbladder to  the duodenum that appeared chronic signifying much chronic reaction.   DESCRIPTION OF PROCEDURE:  Keiosha Cancro was taken to room 6 on Monday,  September 07, 2008 to general anesthesia.  The abdomen was prepped with  Techni-Care equivalent and draped sterilely.  I entered the abdomen,  cutting down in the umbilicus and then we used an oblique OptiView  approach to go kind of at a very oblique angle to the gallbladder.  The  Hasson was exchanged out and then the 11 was used in the upper abdomen  along with two 5's laterally.  The gallbladder was grasped, elevated and  the aforementioned adhesions were noted between the lower portion of the  gallbladder infundibulum to the duodenum.  These were appeared to be  very chronic and I cut these down with sharp dissection.  I then  dissected free Calot's triangle.  Used electrocautery to open the  peritoneal window so I could get a critical view which I obtained.  I  put a clip up on the gallbladder and incised the cystic duct and did a  cholangiogram using a Reddick catheter which showed good filling of the  common bile duct with free flow in the duodenum.  Cystic duct was then  triple clipped and divided.   Cystic artery was double clipped and  divided and the gallbladder was removed from the gallbladder bed with  minimal bleeding until I got to the very top at which point, I had to  use a little more cautery.  This oozing was controlled and everything  was dry and I went ahead and just put a piece of Surgicel in there.  No  bile leaks were noted.  No other bleeding was noted.  The gallbladder  was placed in a bag and brought out through the upper port which was  removed.  The umbilical port was then repaired under laparoscopic direct  vision with a couple sutures of 0 Vicryl, but again this was a very  oblique tunnel.  The port sites were injected and closed with 4-0  Vicryl, Benzoin Steri-Strips.   Next I changed my gloves and went up top and reprepped the port sites  location.  I cut down on that and removed it where it was held in place  with 3 Prolene knots.  These were removed and no back bleeding  was  noted.  I closed  with 4-0 Vicryl, Benzoin and Steri-Strips as well.  The patient  tolerated the procedure well.  She had some UTI symptoms prior to the  surgery.  I went ahead and put her on Cipro 5 mg b.i.d. postoperatively  as well as gave her some Vicodin to take if needed for pain.  The  patient at her request wanted to go home today and will be discharged.      Thornton Park Daphine Deutscher, MD  Electronically Signed     MBM/MEDQ  D:  09/07/2008  T:  09/07/2008  Job:  [id number]   cc:   Valentino Hue. Magrinat, M.D.  Fax: [phone]

## 2010-12-07 LAB — CANCER ANTIGEN 27.29: CA 27.29: 23 U/mL (ref 0–39)

## 2010-12-13 ENCOUNTER — Encounter (HOSPITAL_BASED_OUTPATIENT_CLINIC_OR_DEPARTMENT_OTHER): Payer: 59 | Admitting: Oncology

## 2010-12-13 DIAGNOSIS — C50919 Malignant neoplasm of unspecified site of unspecified female breast: Secondary | ICD-10-CM

## 2010-12-13 DIAGNOSIS — I890 Lymphedema, not elsewhere classified: Secondary | ICD-10-CM

## 2010-12-13 DIAGNOSIS — Z170 Estrogen receptor positive status [ER+]: Secondary | ICD-10-CM

## 2011-01-27 IMAGING — CR DG CHEST 2V
3 series · 3 of 3 positions shown · non-contrast
Comparison: 11/27/2007

CLINICAL DATA: 52-year-old and restaging breast cancer.

CHEST - 2 VIEW

[w chest pa]
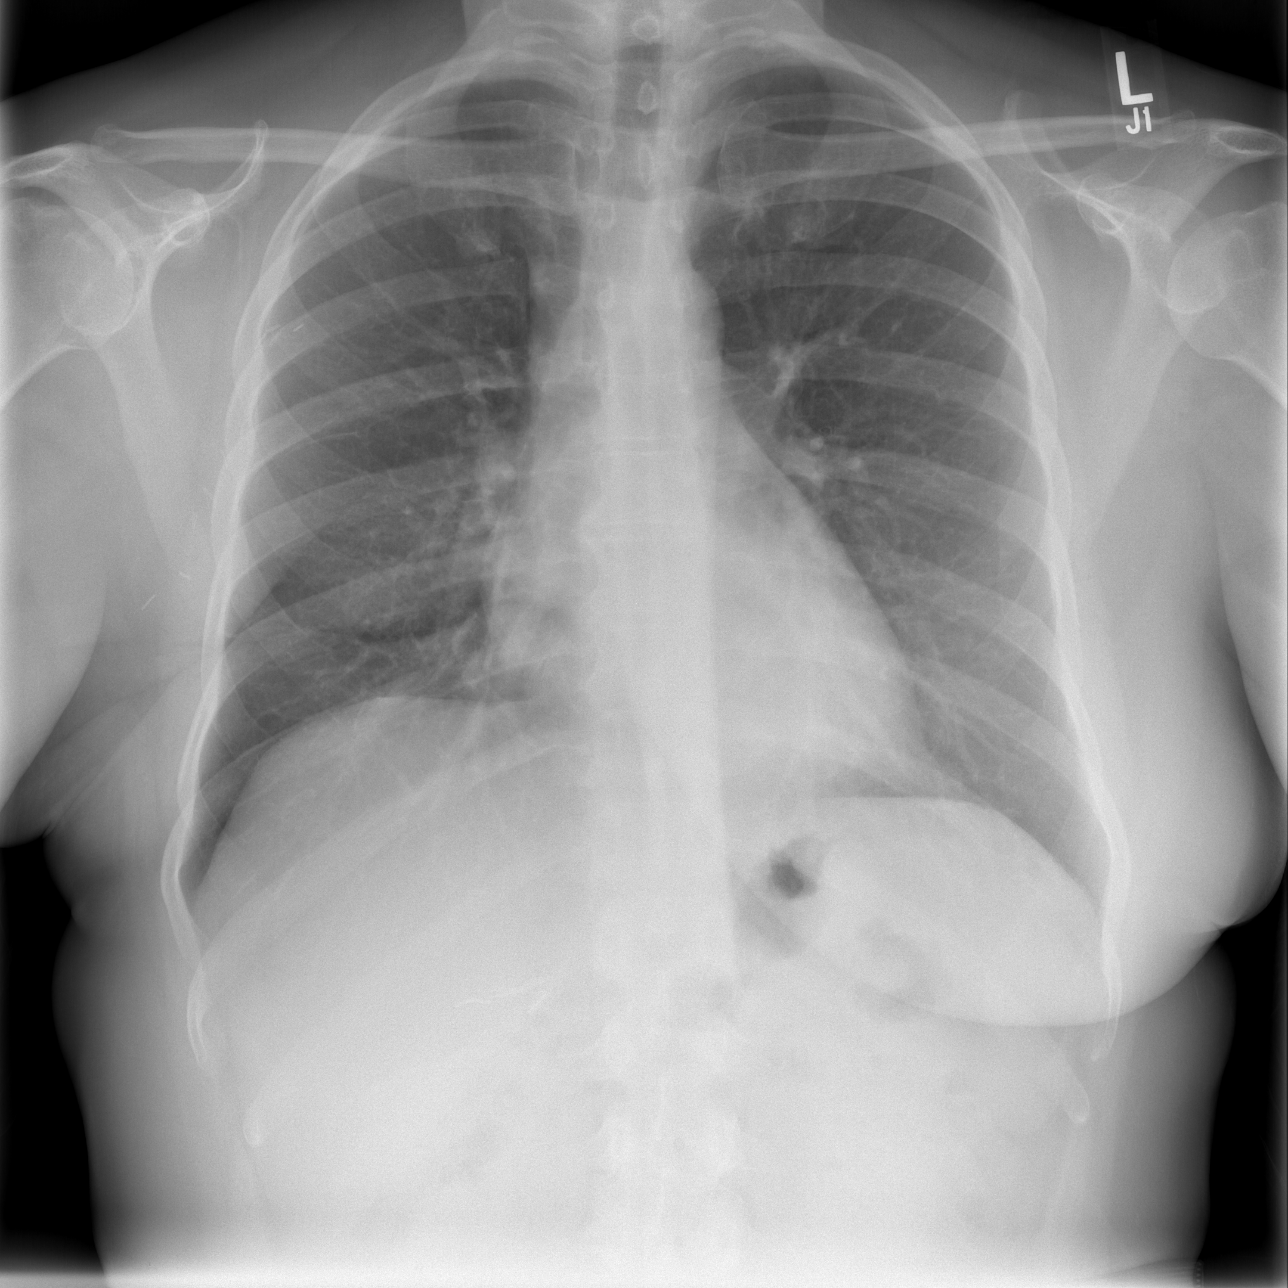

[w chest lat (1 of 2)]
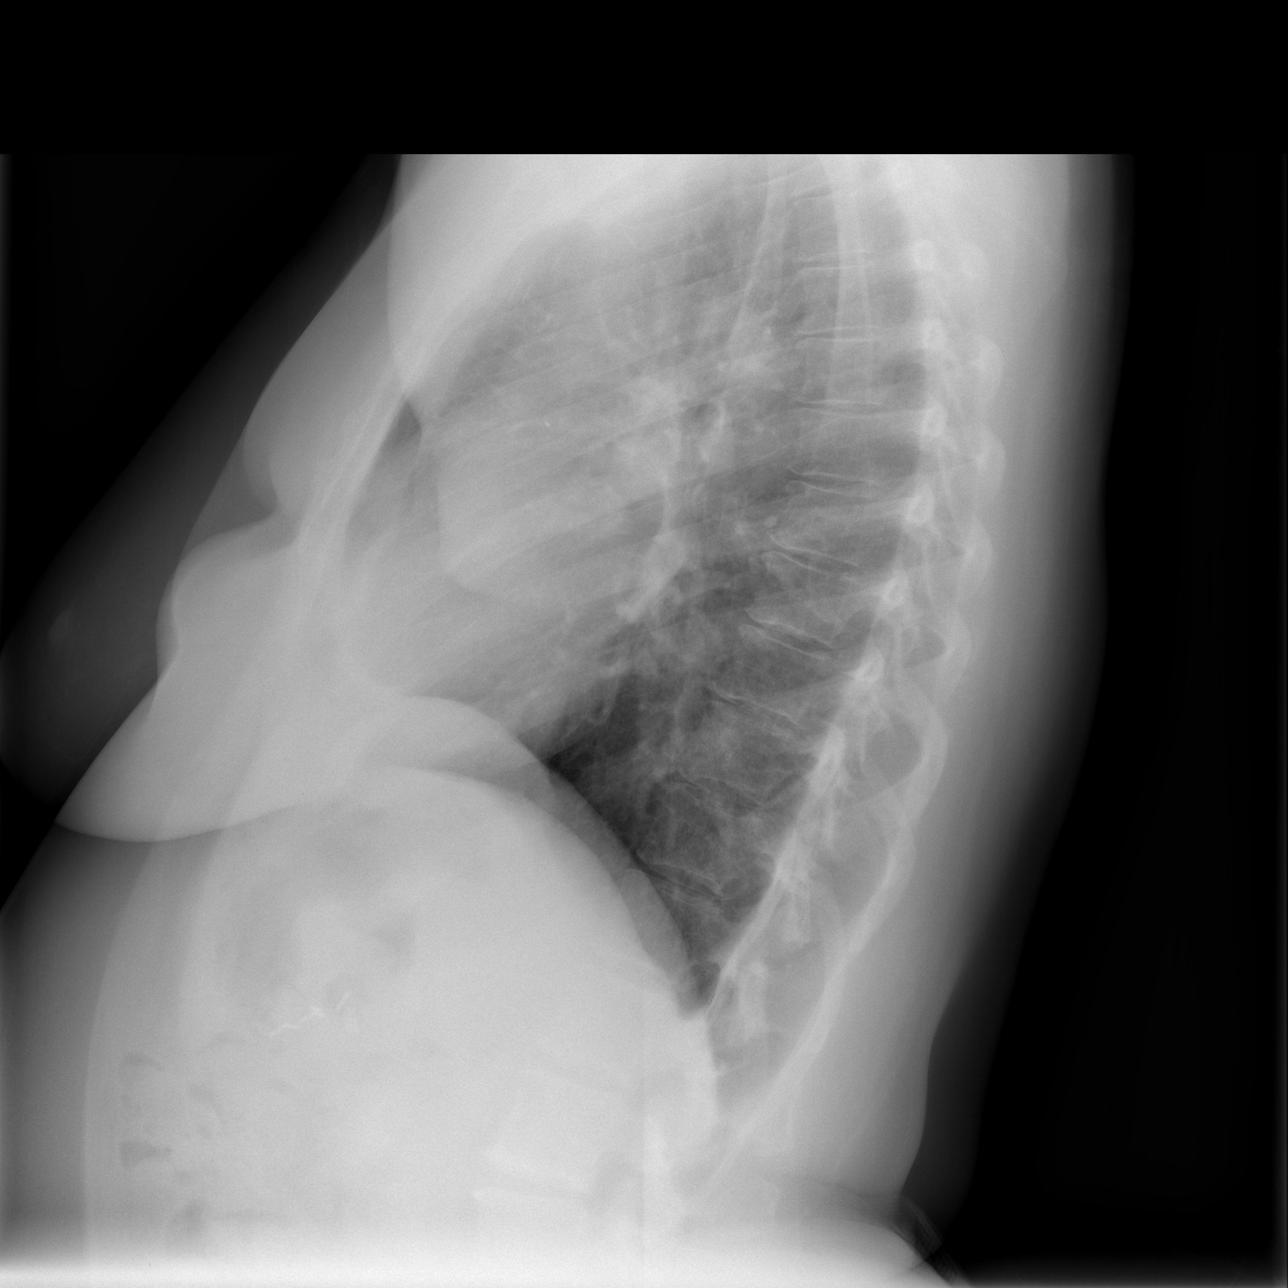

[w chest lat (2 of 2)]
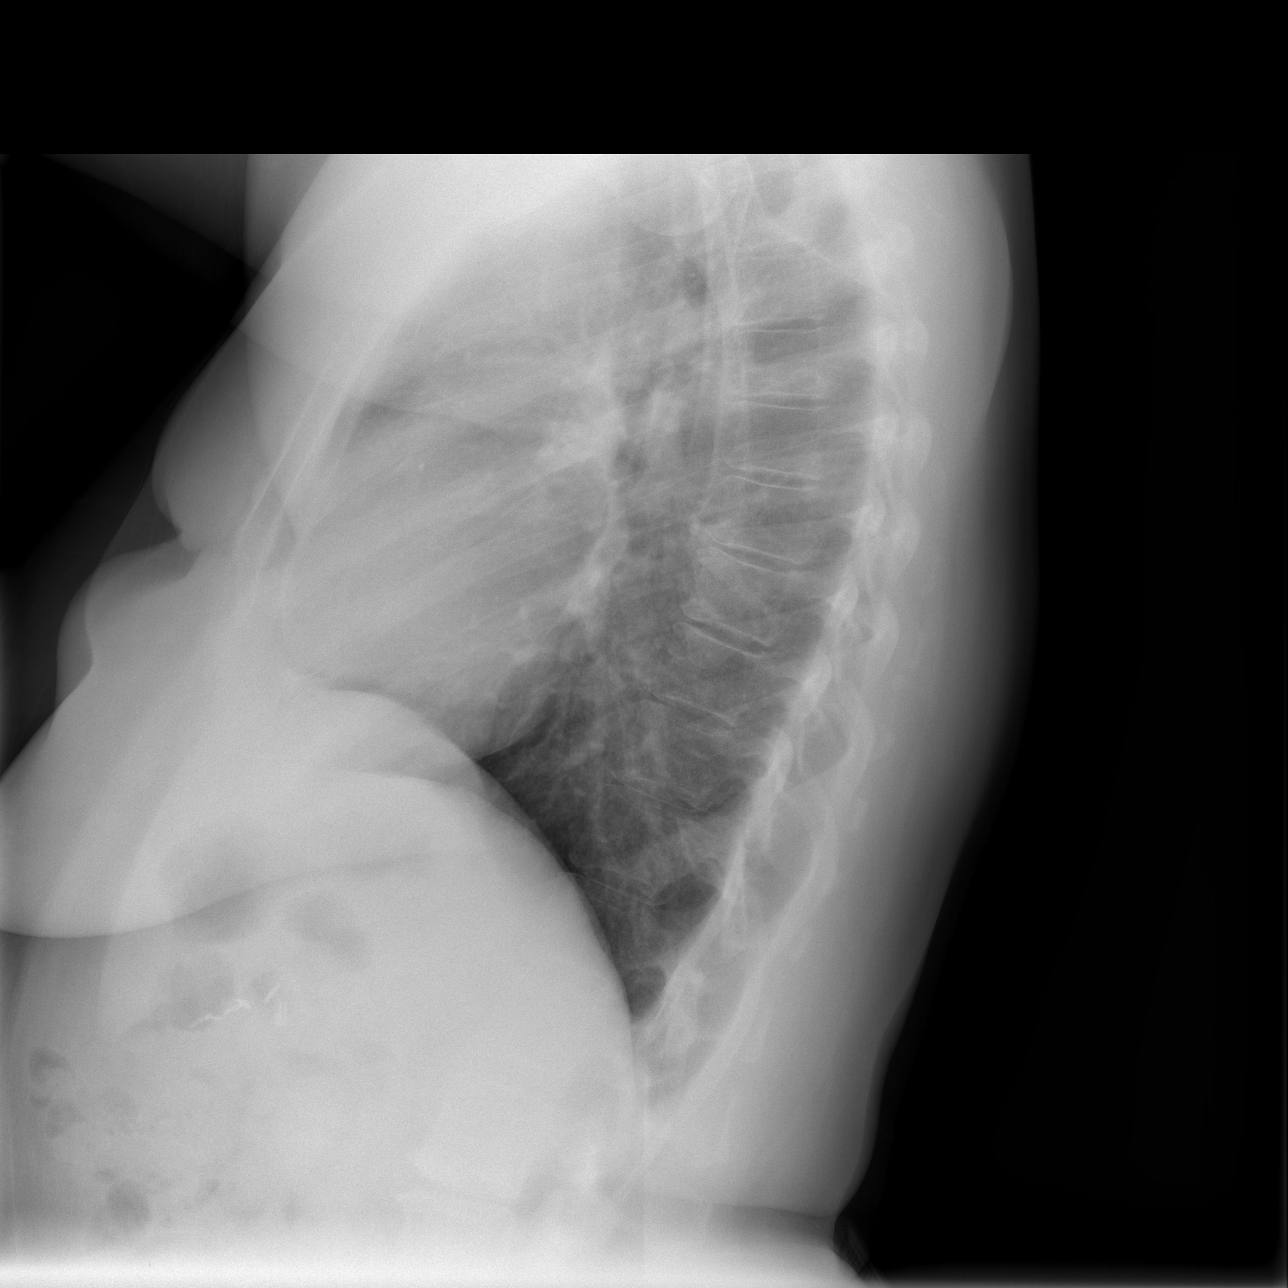

[3 of 3 positions shown; findings below may reference images not displayed]

FINDINGS: Two views of the chest were obtained.  The left
subclavian Port-A-Cath has been removed.  Surgical clips in the
right axilla and evidence for a right mastectomy.  There is no
focal airspace disease.  There is elevation of the right
hemidiaphragm and findings could represent post-treatment fibrotic
changes and volume loss.  The heart and mediastinal contour are
stable and within normal limits.  The trachea is midline.  Bony
structures are intact.
IMPRESSION: No acute chest findings.

Mild volume loss in the right hemithorax.  Findings may be related
to post treatment changes.

## 2011-04-26 ENCOUNTER — Ambulatory Visit: Payer: 59 | Attending: Oncology | Admitting: Physical Therapy

## 2011-04-26 DIAGNOSIS — IMO0001 Reserved for inherently not codable concepts without codable children: Secondary | ICD-10-CM | POA: Insufficient documentation

## 2011-04-26 DIAGNOSIS — I890 Lymphedema, not elsewhere classified: Secondary | ICD-10-CM | POA: Insufficient documentation

## 2011-04-27 ENCOUNTER — Ambulatory Visit: Payer: 59 | Admitting: Physical Therapy

## 2011-05-01 ENCOUNTER — Ambulatory Visit: Payer: 59 | Admitting: Physical Therapy

## 2011-05-03 ENCOUNTER — Ambulatory Visit: Payer: 59 | Admitting: Physical Therapy

## 2011-05-05 ENCOUNTER — Ambulatory Visit: Payer: 59 | Admitting: Physical Therapy

## 2011-05-08 ENCOUNTER — Ambulatory Visit: Payer: 59 | Admitting: Physical Therapy

## 2011-05-10 ENCOUNTER — Ambulatory Visit: Payer: 59 | Admitting: Physical Therapy

## 2011-05-12 ENCOUNTER — Ambulatory Visit: Payer: 59 | Admitting: Physical Therapy

## 2011-05-15 ENCOUNTER — Ambulatory Visit: Payer: 59 | Admitting: Physical Therapy

## 2011-05-17 ENCOUNTER — Ambulatory Visit: Payer: 59 | Admitting: Physical Therapy

## 2011-05-19 ENCOUNTER — Ambulatory Visit: Payer: 59 | Admitting: Physical Therapy

## 2011-05-22 ENCOUNTER — Ambulatory Visit: Payer: 59 | Admitting: Physical Therapy

## 2011-05-24 ENCOUNTER — Ambulatory Visit: Payer: 59 | Admitting: Physical Therapy

## 2011-05-26 ENCOUNTER — Ambulatory Visit: Payer: 59 | Attending: Oncology | Admitting: Physical Therapy

## 2011-05-26 DIAGNOSIS — I890 Lymphedema, not elsewhere classified: Secondary | ICD-10-CM | POA: Insufficient documentation

## 2011-05-26 DIAGNOSIS — IMO0001 Reserved for inherently not codable concepts without codable children: Secondary | ICD-10-CM | POA: Insufficient documentation

## 2011-05-29 ENCOUNTER — Ambulatory Visit: Payer: 59 | Admitting: Physical Therapy

## 2011-05-31 ENCOUNTER — Ambulatory Visit: Payer: 59 | Admitting: Physical Therapy

## 2011-06-02 ENCOUNTER — Ambulatory Visit: Payer: 59 | Admitting: Physical Therapy

## 2011-06-05 ENCOUNTER — Ambulatory Visit: Payer: 59 | Admitting: Physical Therapy

## 2011-06-07 ENCOUNTER — Ambulatory Visit: Payer: 59 | Admitting: Physical Therapy

## 2011-06-09 ENCOUNTER — Ambulatory Visit: Payer: 59 | Admitting: Physical Therapy

## 2011-06-12 ENCOUNTER — Ambulatory Visit: Payer: 59 | Admitting: Physical Therapy

## 2011-06-14 ENCOUNTER — Ambulatory Visit: Payer: 59 | Admitting: Physical Therapy

## 2011-06-19 ENCOUNTER — Encounter: Payer: 59 | Admitting: Physical Therapy

## 2011-06-22 ENCOUNTER — Ambulatory Visit: Payer: 59 | Admitting: Physical Therapy

## 2011-06-26 ENCOUNTER — Encounter: Payer: 59 | Admitting: Physical Therapy

## 2011-06-28 ENCOUNTER — Encounter: Payer: 59 | Admitting: Physical Therapy

## 2011-06-30 ENCOUNTER — Encounter: Payer: 59 | Admitting: Physical Therapy

## 2011-09-14 ENCOUNTER — Encounter: Payer: Self-pay | Admitting: Gynecology

## 2011-09-14 DIAGNOSIS — C801 Malignant (primary) neoplasm, unspecified: Secondary | ICD-10-CM | POA: Insufficient documentation

## 2011-09-14 DIAGNOSIS — N83209 Unspecified ovarian cyst, unspecified side: Secondary | ICD-10-CM | POA: Insufficient documentation

## 2011-09-14 DIAGNOSIS — D069 Carcinoma in situ of cervix, unspecified: Secondary | ICD-10-CM | POA: Insufficient documentation

## 2011-09-14 DIAGNOSIS — D352 Benign neoplasm of pituitary gland: Secondary | ICD-10-CM | POA: Insufficient documentation

## 2011-09-21 ENCOUNTER — Other Ambulatory Visit (HOSPITAL_COMMUNITY)
Admission: RE | Admit: 2011-09-21 | Discharge: 2011-09-21 | Disposition: A | Discharge status: Home / Self Care | Payer: 59 | Source: Ambulatory Visit | Attending: Obstetrics and Gynecology | Admitting: Obstetrics and Gynecology

## 2011-09-21 ENCOUNTER — Ambulatory Visit (INDEPENDENT_AMBULATORY_CARE_PROVIDER_SITE_OTHER): Payer: 59 | Admitting: Obstetrics and Gynecology

## 2011-09-21 ENCOUNTER — Encounter: Payer: Self-pay | Admitting: Obstetrics and Gynecology

## 2011-09-21 VITALS — BP 138/80 | Ht 67.0 in | Wt 246.0 lb

## 2011-09-21 DIAGNOSIS — I10 Essential (primary) hypertension: Secondary | ICD-10-CM | POA: Insufficient documentation

## 2011-09-21 DIAGNOSIS — N83209 Unspecified ovarian cyst, unspecified side: Secondary | ICD-10-CM

## 2011-09-21 DIAGNOSIS — Z01419 Encounter for gynecological examination (general) (routine) without abnormal findings: Secondary | ICD-10-CM

## 2011-09-21 DIAGNOSIS — Z780 Asymptomatic menopausal state: Secondary | ICD-10-CM

## 2011-09-21 DIAGNOSIS — N951 Menopausal and female climacteric states: Secondary | ICD-10-CM

## 2011-09-21 NOTE — Patient Instructions (Signed)
Scheduled bone density and pelvic ultrasound

## 2011-09-21 NOTE — Progress Notes (Signed)
Patient came to see me today for her annual GYN exam. She remains cancer free. She had a normal mammogram and MRI last year. She will get her mammogram in April of this year. At one point we diagnosed her with pituitary microadenoma. Her last MRI did not reveal it. She has also had normal prolactin since then. She is having no vaginal bleeding. She is having no pelvic pain. We are watching her with a left ovarian cyst which is simple. Her last ultrasound was October of 2011. She does her labs through PCP.  Physical examination:[name] Gibson present. HEENT within normal limits. Neck: Thyroid not large. No masses. Supraclavicular nodes: not enlarged. Breasts: Examined in both sitting midline position. No skin changes and no masses in left breast. Right mastectomy without masses. Abdomen: Soft no guarding rebound or masses or hernia. Pelvic: External: Within normal limits. BUS: Within normal limits. Vaginal:within normal limits. Good estrogen effect. No evidence of cystocele rectocele or enterocele. Cervix: clean. Uterus: Normal size and shape. Adnexa: No masses. Rectovaginal exam: Confirmatory and negative. Extremities: Within normal limits.  Assessment: #1. Breast cancer #2. Ovarian cyst  Plan: Mammogram in April. Oncologists is also going to do  BSGI this year. Bone density scheduled. Pelvic ultrasound scheduled.

## 2011-09-22 LAB — URINALYSIS W MICROSCOPIC + REFLEX CULTURE
Casts: NONE SEEN
Crystals: NONE SEEN
Leukocytes, UA: NEGATIVE
Nitrite: NEGATIVE
Specific Gravity, Urine: 1.018 (ref 1.005–1.030)
pH: 5.5 (ref 5.0–8.0)

## 2011-09-29 ENCOUNTER — Other Ambulatory Visit: Payer: 59

## 2011-09-29 ENCOUNTER — Ambulatory Visit: Payer: 59 | Admitting: Obstetrics and Gynecology

## 2011-10-17 ENCOUNTER — Other Ambulatory Visit: Payer: Self-pay | Admitting: *Deleted

## 2011-10-19 ENCOUNTER — Other Ambulatory Visit: Payer: Self-pay | Admitting: Oncology

## 2011-10-19 DIAGNOSIS — C50919 Malignant neoplasm of unspecified site of unspecified female breast: Secondary | ICD-10-CM

## 2011-10-20 ENCOUNTER — Telehealth: Payer: Self-pay | Admitting: Oncology

## 2011-10-20 NOTE — Telephone Encounter (Signed)
S/w sherry from the [facility] regarding her appts. Pt is already scheduled on 11/01/2011@9 :00am. S/e the pt and made her aware.

## 2011-11-01 ENCOUNTER — Encounter: Payer: Self-pay | Admitting: Obstetrics and Gynecology

## 2011-11-08 ENCOUNTER — Ambulatory Visit (INDEPENDENT_AMBULATORY_CARE_PROVIDER_SITE_OTHER): Payer: 59

## 2011-11-08 DIAGNOSIS — N951 Menopausal and female climacteric states: Secondary | ICD-10-CM

## 2011-11-08 DIAGNOSIS — Z780 Asymptomatic menopausal state: Secondary | ICD-10-CM

## 2011-11-08 DIAGNOSIS — Z13820 Encounter for screening for osteoporosis: Secondary | ICD-10-CM

## 2011-11-20 ENCOUNTER — Ambulatory Visit (INDEPENDENT_AMBULATORY_CARE_PROVIDER_SITE_OTHER): Payer: 59 | Admitting: Obstetrics and Gynecology

## 2011-11-20 ENCOUNTER — Ambulatory Visit (INDEPENDENT_AMBULATORY_CARE_PROVIDER_SITE_OTHER): Payer: 59

## 2011-11-20 DIAGNOSIS — N83209 Unspecified ovarian cyst, unspecified side: Secondary | ICD-10-CM

## 2011-11-20 DIAGNOSIS — N912 Amenorrhea, unspecified: Secondary | ICD-10-CM

## 2011-11-20 NOTE — Progress Notes (Signed)
Patient came to see me today for followup of the left ovarian cyst. On ultrasound her uterus is anteverted with cortical cysts that are consistent with adenomyosis.  her endometrial cavity is somewhat prominent and measures 6.6 mm. The right ovary was completely normal. The left ovary continues to see an echo-free cyst which is now 4.5 cm. This time we scanned her it was 3.6 cm. It remains avascular. The cul-de-sac is free of fluid. She remains amenorrheic since her chemotherapy. She  Continues to have a premenopausal FSH and continues to contracept with condoms.  Assessment: Persistent left ovarian cyst  Plan: Since it is slightly larger in size we will rescan her in 4 months. FSH checked.

## 2011-11-21 LAB — FOLLICLE STIMULATING HORMONE: FSH: 4.1 m[IU]/mL

## 2011-12-07 ENCOUNTER — Other Ambulatory Visit: Payer: 59 | Admitting: Lab

## 2011-12-07 ENCOUNTER — Other Ambulatory Visit (HOSPITAL_BASED_OUTPATIENT_CLINIC_OR_DEPARTMENT_OTHER): Payer: 59 | Admitting: Lab

## 2011-12-07 DIAGNOSIS — C50919 Malignant neoplasm of unspecified site of unspecified female breast: Secondary | ICD-10-CM

## 2011-12-07 DIAGNOSIS — Z170 Estrogen receptor positive status [ER+]: Secondary | ICD-10-CM

## 2011-12-07 LAB — COMPREHENSIVE METABOLIC PANEL
AST: 14 U/L (ref 0–37)
Albumin: 3.6 g/dL (ref 3.5–5.2)
Alkaline Phosphatase: 79 U/L (ref 39–117)
BUN: 18 mg/dL (ref 6–23)
Potassium: 4.2 mEq/L (ref 3.5–5.3)
Sodium: 140 mEq/L (ref 135–145)
Total Protein: 6.6 g/dL (ref 6.0–8.3)

## 2011-12-07 LAB — CBC WITH DIFFERENTIAL/PLATELET
BASO%: 0.8 % (ref 0.0–2.0)
EOS%: 2.5 % (ref 0.0–7.0)
MCH: 31.3 pg (ref 25.1–34.0)
MCHC: 33.8 g/dL (ref 31.5–36.0)
RBC: 4.35 10*6/uL (ref 3.70–5.45)
RDW: 13.4 % (ref 11.2–14.5)
lymph#: 2.8 10*3/uL (ref 0.9–3.3)

## 2011-12-14 ENCOUNTER — Ambulatory Visit: Payer: 59 | Admitting: Oncology

## 2011-12-26 ENCOUNTER — Telehealth: Payer: Self-pay | Admitting: Oncology

## 2011-12-26 ENCOUNTER — Ambulatory Visit (HOSPITAL_BASED_OUTPATIENT_CLINIC_OR_DEPARTMENT_OTHER): Payer: 59 | Admitting: Oncology

## 2011-12-26 VITALS — BP 121/80 | HR 91 | Temp 98.2°F | Ht 67.0 in | Wt 246.1 lb

## 2011-12-26 DIAGNOSIS — Z170 Estrogen receptor positive status [ER+]: Secondary | ICD-10-CM

## 2011-12-26 DIAGNOSIS — C50919 Malignant neoplasm of unspecified site of unspecified female breast: Secondary | ICD-10-CM

## 2011-12-26 DIAGNOSIS — C50911 Malignant neoplasm of unspecified site of right female breast: Secondary | ICD-10-CM | POA: Insufficient documentation

## 2011-12-26 MED ORDER — TAMOXIFEN CITRATE 20 MG PO TABS: 20.0000 mg | ORAL_TABLET | Freq: Every day | ORAL | Status: DC

## 2011-12-26 NOTE — Telephone Encounter (Signed)
gve the pt her may,june 2014 appt calendar °

## 2011-12-26 NOTE — Progress Notes (Signed)
ID: [name] Gibson   DOB: 1956-05-29  MR#: [id number]  WUJ#:[id number]  HISTORY OF PRESENT ILLNESS: She had her annual screening mammogram on 10-24-07.  This showed a possible area of asymmetry in the right breast and she returned on 10-29-07 for diagnostic mammography and right breast ultrasound.  In the lower inner right breast, there was an area of architectural distortion which by ultrasound measured 2.9 cm.  It was ill-defined and hypoechoic.  In addition, in a different quadrant, there was an ill-defined hypoechoic mass up to 8 mm.  Breast specific gamma imaging was obtained on the same day and indeed showed two areas of abnormal uptake as already described.  The one inferiorly measured 2.2 cm. and the one superiorly 7 mm. by breast specific gamma imaging.  The patient underwent ultrasound-guided biopsy of both lesions on 11-04-07.  The final pathology showed:  1. An invasive lobular carcinoma (E-cadherin negative). 2. An invasive ductal carcinoma with some evidence of ductal carcinoma in situ.   Her subsequent history is as detailed below  INTERVAL HISTORY: [name] Gibson returns today with her daughter for followup of [name] Gibson's breast cancer. The interval history is generally unremarkable. They just came back from a beach trip. She is playing a little tenderness but not exercising regularly. They just went through a nutrition consult and have joined the [name] Gibson.  REVIEW OF SYSTEMS: Right upper extremity lymphedema continues to be her worst problem. She uses a sleeve at night, different sleeve during the day, and uses a pump, but even with all that she has a significant swelling of the right arm which is to some extent to disabling. She is tolerating the tamoxifen with no side effects that she is aware of she tells me Dr.taken recently checked and she is still premenopausal. Otherwise a detailed review of systems today was noncontributory  PAST MEDICAL HISTORY: Past Medical History  Diagnosis  Date  . 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 Ovarian cyst   . Cancer     Lobular breast cancer -Right  . Pituitary microadenoma   . Hypertension   . Swelling of arm   1. History of a possible mitral valve prolapse murmur (the patient currently does not take prophylactic antibiotics before dental procedures). 2. History of Fen-Phen use. 3. History of minimal urinary incontinence. Marland Kitchen    PAST SURGICAL HISTORY: Past Surgical History  Procedure Date  . Tonsillectomy   . Anal fistula repair   . Breast surgery     Right Mastectomy  . Cervical cone biopsy   . Cholecystectomy   . Colposcopy     FAMILY HISTORY Family History  Problem Relation Age of Onset  . Hypertension Mother   . Melanoma Mother   . Diabetes Father   . Hypertension Father   . Heart disease Father   . Parkinsonism Father   . Breast cancer Paternal Aunt   . Breast cancer Cousin   . COPD Brother   The patient's father died at the age of 56 in the setting of Parkinson's disease and multiple other medical problems.  The patient's mother is alive at 28.  She has a history of foot melanoma.  The patient has one brother and one sister.  There is no breast cancer or ovarian cancer in the immediate family.  In the patient's mother's father's family, however, two daughters of one sister have been diagnosed with cancer, one at the age of 55 who unfortunately succumbed and one at the  age of 59.  So this would be two first cousins out of a large number of cousins (recall that the patient's father was one of 12 siblings).  GYNECOLOGIC HISTORY: She is GX P2, first pregnancy to term age 62. Last menstrual period started last week.  She has occasional hot flashes.  She tells me Dr. Eda Gibson has tested her to see if she is menopausal but she had some trouble understanding those results and, in any case, she just had a period.    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  Her husband, [name] Gibson, is a Statistician but he is partially disabled, and he is currently doing some mediation.  They have a 31 year old daughter and a 4 year old son.   ADVANCED DIRECTIVES: not in place  HEALTH MAINTENANCE: History  Substance Use Topics  . Smoking status: Former Games developer  . Smokeless tobacco: Not on file  . Alcohol Use: No     Colonoscopy:  PAP: UTD/[name] Gibson  Bone density:  Lipid panel:  Allergies  Allergen Reactions  . Sulfa Antibiotics     Current Outpatient Prescriptions  Medication Sig Dispense Refill  . Calcium Carbonate-Vitamin D (CALCIUM + D PO) Take by mouth.      . Escitalopram Oxalate (LEXAPRO PO) Take 10 mg by mouth.      . fish oil-omega-3 fatty acids 1000 MG capsule Take 1 g by mouth daily.      Marland Kitchen HYDROCHLOROTHIAZIDE PO Take by mouth.      Marland Kitchen LISINOPRIL PO Take by mouth.      . Potassium Chloride (KLOR-CON PO) Take by mouth.      . TAMOXIFEN CITRATE PO Take by mouth.      . zolpidem (AMBIEN) 5 MG tablet Take 5 mg by mouth at bedtime as needed.        OBJECTIVE: Middle-aged white woman in no acute distress There were no vitals filed for this visit.   There is no height or weight on file to calculate BMI.    ECOG FS: 1  Sclerae unicteric Oropharynx clear No peripheral adenopathy Lungs no rales or rhonchi Heart regular rate and rhythm, no murmur appreciated Abd obese benign MSK no focal spinal tenderness, significant edema RUE, chronic, no erythema Neuro: nonfocal Breasts: Right breast is status post mastectomy; there are some telangiectasias laterally secondary to radiation; left breast is unremarkable  LAB RESULTS: Lab Results  Component Value Date   WBC 9.3 12/07/2011   NEUTROABS 5.5 12/07/2011   HGB 13.6 12/07/2011   HCT 40.3 12/07/2011   MCV 92.6 12/07/2011   PLT 259 12/07/2011      Chemistry      Component Value Date/Time   NA 140 12/07/2011 1601   K 4.2 12/07/2011 1601   CL 100 12/07/2011 1601   CO2  29 12/07/2011 1601   BUN 18 12/07/2011 1601   BUN 16 11/02/2010 1613   CREATININE 0.78 12/07/2011 1601   CREATININE 0.8 11/02/2010 1613      Component Value Date/Time   CALCIUM 9.0 12/07/2011 1601   ALKPHOS 79 12/07/2011 1601   AST 14 12/07/2011 1601   ALT 16 12/07/2011 1601   BILITOT 0.1* 12/07/2011 1601       Lab Results  Component Value Date   LABCA2 21 12/07/2011    No components found with this basename: [id number]    No results found for this basename: INR:1;PROTIME:1 in the last 168 hours  Urinalysis  Component Value Date/Time   COLORURINE YELLOW 09/21/2011 1611   APPEARANCEUR CLEAR 09/21/2011 1611   LABSPEC 1.018 09/21/2011 1611   PHURINE 5.5 09/21/2011 1611   GLUCOSEU NEG 09/21/2011 1611   HGBUR NEG 09/21/2011 1611   BILIRUBINUR NEG 09/21/2011 1611   KETONESUR NEG 09/21/2011 1611   PROTEINUR NEG 09/21/2011 1611   UROBILINOGEN 0.2 09/21/2011 1611   NITRITE NEG 09/21/2011 1611   LEUKOCYTESUR NEG 09/21/2011 1611    STUDIES: Left diagnostic mammography at [facility] 11/01/2011 was unremarkable  ASSESSMENT: 56 year old [place] woman status post right modified radical mastectomy in May 2009 for a multifocal/multicentric breast carcinoma, the major component being lobular with a minor ductal component, pathologically T2 N1, Stage IIB, grade 2, estrogen and progesterone receptor positive, HER2 negative, with a low MIB-1, treated adjuvantly with dose-dense doxorubicin/cyclophosphamide x4 followed by weekly paclitaxel x12, completed October 2009, followed by radiation completed on December 2009 at which point she started tamoxifen.   PLAN: She tells me lab work through Dr. Verl Dicker office shows is still to be premenopausal. The good news is that now we can continue tamoxifen to a total of 10 years, and get similar benefit as we could switching at 5 years to an aromatase inhibitor. Since she is tolerating tamoxifen well, that might well be a what we ended up doing.  She really has 2 sleeves  and a pump for the right upper extremity. Unfortunately this is a chronic issue. I am hopeful it will not a repeat her exercise tolerance now that she is planning to start a regular program. She will let me know if we can be of help in that regard. Otherwise she will see me again in one year. She knows to call for any problems that may develop before that.  Bond Grieshop C    12/26/2011

## 2012-01-02 ENCOUNTER — Other Ambulatory Visit: Payer: Self-pay | Admitting: Oncology

## 2012-07-11 ENCOUNTER — Other Ambulatory Visit: Payer: Self-pay | Admitting: *Deleted

## 2012-07-11 DIAGNOSIS — C50919 Malignant neoplasm of unspecified site of unspecified female breast: Secondary | ICD-10-CM

## 2012-07-15 ENCOUNTER — Other Ambulatory Visit: Payer: Self-pay | Admitting: *Deleted

## 2012-07-15 ENCOUNTER — Telehealth: Payer: Self-pay | Admitting: *Deleted

## 2012-07-15 ENCOUNTER — Telehealth: Payer: Self-pay | Admitting: Oncology

## 2012-07-15 ENCOUNTER — Ambulatory Visit (HOSPITAL_COMMUNITY)
Admission: RE | Admit: 2012-07-15 | Discharge: 2012-07-15 | Disposition: A | Discharge status: Home / Self Care | Payer: 59 | Source: Ambulatory Visit | Attending: Oncology | Admitting: Oncology

## 2012-07-15 DIAGNOSIS — C50919 Malignant neoplasm of unspecified site of unspecified female breast: Secondary | ICD-10-CM

## 2012-07-15 DIAGNOSIS — I10 Essential (primary) hypertension: Secondary | ICD-10-CM | POA: Insufficient documentation

## 2012-07-15 DIAGNOSIS — R0602 Shortness of breath: Secondary | ICD-10-CM | POA: Insufficient documentation

## 2012-07-15 DIAGNOSIS — Z9010 Acquired absence of unspecified breast and nipple: Secondary | ICD-10-CM | POA: Insufficient documentation

## 2012-07-15 DIAGNOSIS — Z853 Personal history of malignant neoplasm of breast: Secondary | ICD-10-CM | POA: Insufficient documentation

## 2012-07-15 NOTE — Telephone Encounter (Signed)
Patient confirmed over the phone the get an walk in chest x-ray try to there on 07-15-2012

## 2012-07-15 NOTE — Telephone Encounter (Signed)
Received vm from desk nurse on Friday 12/20 which including a forwarded message from pt asking if nurse has anything set for her today. Desk nurse also mentioned that she sent a pof for pt. Returned desk nurse's call today as I was out on Friday. lmonvm for desk nurse that I was out on Friday and I do not find a pof for pt and I am unsure if she received what she needed. Desk nurse asked to call me back re what it is that pt needs.

## 2012-07-16 ENCOUNTER — Ambulatory Visit (HOSPITAL_BASED_OUTPATIENT_CLINIC_OR_DEPARTMENT_OTHER): Payer: 59 | Admitting: Lab

## 2012-07-16 ENCOUNTER — Telehealth: Payer: Self-pay | Admitting: Oncology

## 2012-07-16 DIAGNOSIS — C50919 Malignant neoplasm of unspecified site of unspecified female breast: Secondary | ICD-10-CM

## 2012-07-16 LAB — COMPREHENSIVE METABOLIC PANEL (CC13)
ALT: 23 U/L (ref 0–55)
Alkaline Phosphatase: 78 U/L (ref 40–150)
Sodium: 142 mEq/L (ref 136–145)
Total Bilirubin: 0.36 mg/dL (ref 0.20–1.20)
Total Protein: 6.5 g/dL (ref 6.4–8.3)

## 2012-07-16 LAB — CANCER ANTIGEN 27.29: CA 27.29: 25 U/mL (ref 0–39)

## 2012-07-16 NOTE — Telephone Encounter (Signed)
Val returned my call and lm that she added orders and sent pof. Checked pof which was already complete and pt came for cxr but did not have lb. S/w pt this morning re coming in for lb today. Pt also asked about cxr results and was asked to check w/Val when she comes in for lab. Val aware.

## 2012-09-07 ENCOUNTER — Other Ambulatory Visit: Payer: Self-pay

## 2012-09-25 ENCOUNTER — Encounter: Payer: Self-pay | Admitting: Women's Health

## 2012-09-25 ENCOUNTER — Ambulatory Visit (INDEPENDENT_AMBULATORY_CARE_PROVIDER_SITE_OTHER): Payer: 59 | Admitting: Women's Health

## 2012-09-25 VITALS — BP 122/80 | Ht 66.25 in | Wt 243.0 lb

## 2012-09-25 DIAGNOSIS — F3289 Other specified depressive episodes: Secondary | ICD-10-CM

## 2012-09-25 DIAGNOSIS — N83209 Unspecified ovarian cyst, unspecified side: Secondary | ICD-10-CM

## 2012-09-25 DIAGNOSIS — N912 Amenorrhea, unspecified: Secondary | ICD-10-CM

## 2012-09-25 DIAGNOSIS — C801 Malignant (primary) neoplasm, unspecified: Secondary | ICD-10-CM

## 2012-09-25 DIAGNOSIS — Z01419 Encounter for gynecological examination (general) (routine) without abnormal findings: Secondary | ICD-10-CM

## 2012-09-25 MED ORDER — ESCITALOPRAM OXALATE 10 MG PO TABS: 10.0000 mg | ORAL_TABLET | Freq: Every day | ORAL | Status: DC

## 2012-09-25 NOTE — Patient Instructions (Addendum)

## 2012-09-25 NOTE — Progress Notes (Signed)
[name] Gibson 1956/02/17 [id number]    History:    The patient presents for annual exam.  Amenorrheic, FSH 4 -2013,  5 -2012.history of lobular right breast cancer 10/2007-mastectomy, radiation and chemotherapy, on tamoxifen. History of left ovarian cyst avascular 45 mm mean persistent since 09. Normal DEXA 10/2011 T score +1.7 AP spine, bilateral hip average 1.2. Morbidly obese currently on Weight Watchers. History of an CIN-3 with conization in 89 and normal Paps after. Has not had a colonoscopy.  Past medical history, past surgical history, family history and social history were all reviewed and documented in the EPIC chart. Works in Consulting civil engineer at [employer] Daughter and a 20, [name] Gibson 51 both doing well. Husband has MS and can have anger issues. History of a pituitary microadenoma, resolved on last MRI, normal prolactin.   ROS:  A  ROS was performed and pertinent positives and negatives are included in the history.  Exam:  Filed Vitals:   09/25/12 1604  BP: 122/80    General appearance:  Normal Head/Neck:  Normal, without cervical or supraclavicular adenopathy. Thyroid:  Symmetrical, normal in size, without palpable masses or nodularity. Respiratory  Effort:  Normal  Auscultation:  Clear without wheezing or rhonchi Cardiovascular  Auscultation:  Regular rate, without rubs, murmurs or gallops  Edema/varicosities:  Not grossly evident Abdominal  Soft,nontender, without masses, guarding or rebound.  Liver/spleen:  No organomegaly noted  Hernia:  None appreciated  Skin  Inspection:  Grossly normal/psoriasis  Palpation:  Grossly normal Neurologic/psychiatric  Orientation:  Normal with appropriate conversation.  Mood/affect:  Normal  Genitourinary    Breasts: Examined lying and sitting.     Right: Mastectomy with right arm lymphedema     Left: Without masses, retractions, discharge or axillary adenopathy.   Inguinal/mons:  Normal without inguinal adenopathy  External genitalia:   Normal  BUS/Urethra/Skene's glands:  Normal  Bladder:  Normal  Vagina:  Normal  Cervix:  Normal  Uterus:   normal in size, shape and contour.  Midline and mobile  Adnexa/parametria:     Rt: Without masses or tenderness.   Lt: Without masses or tenderness.  Anus and perineum: Normal  Digital rectal exam: Normal sphincter tone without palpated masses or tenderness  Assessment/Plan:  57 y.o. MWF G2P2 for annual exam with no complaints.  Amenorrheic on tamoxifen Right breast cancer 10/2007-mastectomy, radiation, chemotherapy. Morbid obesity Hypertension Persistent left ovarian cyst Hypertension-primary care labs and meds  Plan: Instructed to schedule screening colonoscopy. FSH, Pap. Normal Pap 2013. SBE's, continue annual mammogram, calcium rich diet, vitamin D 2000 daily encouraged. Reviewed importance of continuing Weight Watchers for weight loss and increasing regular exercise. Continue condoms until [facility] is elevated. Encourage counseling for marital issues/husbands anger, no physical abuse. Pelvic ultrasound, will schedule.         Harrington Challenger [facility], 5:01 PM 09/25/2012

## 2012-09-26 ENCOUNTER — Other Ambulatory Visit (HOSPITAL_COMMUNITY)
Admission: RE | Admit: 2012-09-26 | Discharge: 2012-09-26 | Disposition: A | Discharge status: Home / Self Care | Payer: 59 | Source: Ambulatory Visit | Attending: Obstetrics and Gynecology | Admitting: Obstetrics and Gynecology

## 2012-09-26 DIAGNOSIS — Z01419 Encounter for gynecological examination (general) (routine) without abnormal findings: Secondary | ICD-10-CM | POA: Insufficient documentation

## 2012-09-26 NOTE — Addendum Note (Signed)
Addended by: Richardson Chiquito on: 09/26/2012 09:15 AM   Modules accepted: Orders

## 2012-10-16 ENCOUNTER — Ambulatory Visit (INDEPENDENT_AMBULATORY_CARE_PROVIDER_SITE_OTHER): Payer: Self-pay | Admitting: Women's Health

## 2012-10-16 ENCOUNTER — Ambulatory Visit (INDEPENDENT_AMBULATORY_CARE_PROVIDER_SITE_OTHER): Payer: 59

## 2012-10-16 ENCOUNTER — Encounter: Payer: Self-pay | Admitting: Women's Health

## 2012-10-16 DIAGNOSIS — Z853 Personal history of malignant neoplasm of breast: Secondary | ICD-10-CM

## 2012-10-16 DIAGNOSIS — R9389 Abnormal findings on diagnostic imaging of other specified body structures: Secondary | ICD-10-CM

## 2012-10-16 DIAGNOSIS — N83209 Unspecified ovarian cyst, unspecified side: Secondary | ICD-10-CM

## 2012-10-16 DIAGNOSIS — N912 Amenorrhea, unspecified: Secondary | ICD-10-CM

## 2012-10-16 DIAGNOSIS — C801 Malignant (primary) neoplasm, unspecified: Secondary | ICD-10-CM

## 2012-10-16 DIAGNOSIS — N800 Endometriosis of uterus: Secondary | ICD-10-CM

## 2012-10-16 DIAGNOSIS — N83202 Unspecified ovarian cyst, left side: Secondary | ICD-10-CM

## 2012-10-16 NOTE — Assessment & Plan Note (Signed)
BRCA neg

## 2012-10-16 NOTE — Progress Notes (Signed)
Patient ID: [name] Gibson, female   DOB: [birth year], 57 y.o.   MRN: [id number] Presents for ultrasound to check endometrial thickness and followup of persistent left ovarian cyst. History of right breast ductal/lobular carcinoma in situ, 2009. Mastectomy with 2/13 positive lymph nodes.  Positive estrogen/progesterone receptive, HER negative. Has been on tamoxifen for 4 years/cancer free. FSH 3.3 08/2012 - amenorrheic for greater than 4 years. Dr. Darnelle Catalan oncologist. Continues to have trouble with right arm lymphedema.  Ultrasound: Anteverted uterus with numerous cystic areas throughout the myometrium. Prominent endometrial cavity, endometrium 7.9 mm. Right ovary normal. Left ovary remnant tissue seen with continued presence of thin-walled, echo-free, avascular cyst 43 x 34 x 47 mm, 41 mm mean. . Negative cul-de-sac. Cyst was 45 mm mean in 2013.  Prominent endometrium Right breast cancer history on tamoxifen Persistent left ovarian cyst /mean 41 mm/getting smaller  Plan: Reviewed with Dr. Lily Peer, Dr. Lily Peer in, spoke with patient concerning need for endometrial biopsy, will schedule. Discussed briefly possible cystectomy due to persistence of left ovarian cyst.

## 2012-10-18 ENCOUNTER — Encounter: Payer: Self-pay | Admitting: Women's Health

## 2012-11-01 ENCOUNTER — Ambulatory Visit (INDEPENDENT_AMBULATORY_CARE_PROVIDER_SITE_OTHER): Payer: 59 | Admitting: Gynecology

## 2012-11-01 ENCOUNTER — Encounter: Payer: Self-pay | Admitting: Gynecology

## 2012-11-01 VITALS — BP 126/82

## 2012-11-01 DIAGNOSIS — R9389 Abnormal findings on diagnostic imaging of other specified body structures: Secondary | ICD-10-CM

## 2012-11-01 DIAGNOSIS — Z853 Personal history of malignant neoplasm of breast: Secondary | ICD-10-CM

## 2012-11-01 DIAGNOSIS — N83202 Unspecified ovarian cyst, left side: Secondary | ICD-10-CM

## 2012-11-01 DIAGNOSIS — N83209 Unspecified ovarian cyst, unspecified side: Secondary | ICD-10-CM

## 2012-11-01 NOTE — Progress Notes (Signed)
Patient presented to the office today for an endometrial biopsy as a result of recent office ultrasound done for endometrial thickness since patient has been on tamoxifen for 4 years (History of right breast ductal/lobular carcinoma in situ, 2009. Mastectomy with 2/13 positive lymph nodes. Positive estrogen/progesterone receptive, HER negative)   Recent office ultrasound demonstrated prominent endometrial cavity with endometrial stripe is 7.9 mm. At the time of recent ultrasound she was noted to have a normal right ovary but with a left ovarian remnant tissue seen with continued presence of a thin-walled echo-free cyst which was avascular and measured 4.3 x 3.4 x 4.7 cm there was no fluid in the cul-de-sac. Average size of cyst in 2013 was 4.5 cm essentially unchanged. Patient has also complained of tenderness in the left lower quadrant.  Patient was counseled today for endometrial biopsy. The cervix was cleansed with Betadine solution. Single-tooth tenaculum was placed on the anterior cervical lip. The uterus sounded to 7 cm. Surprisingly minimal tissue was obtained despite prominent endometrium on recent ultrasound. Tissue specimen submitted for histological evaluation.  Assessment/plan: Patient with past history of right breast ductal 4/lobular carcinoma in situ in 2009 status post mastectomy and currently on year for her tamoxifen. Patient has informed me that there is consideration of continue her tamoxifen beyond 5 years. Patient would like to proceed with a total hysterectomy with removal of her tubes and ovaries since she does not want to continue to worry about this persistent ovarian cyst and serial ultrasounds to measure her endometrial stripe since it causes her discomfort beside anxiety. Patient stated that she had BRCA one BRCA2 testing and that she does not carried mutation. I will speak to her medical oncologist to see if  She has any reservations in the offering her a total laparoscopic  hysterectomy with bilateral salpingo-oophorectomy in May as per wishes.

## 2012-11-01 NOTE — Patient Instructions (Addendum)
Laparoscopic Assisted Vaginal Hysterectomy  A laparoscopic assisted vaginal hysterectomy (LAVH) is a surgical procedure to remove the uterus and cervix, and sometimes the ovaries and fallopian tubes. During an LAVH, some of the surgical removal is done through the vagina and the rest is done through a few small surgical cuts (incisions) in the abdomen.  This procedure is usually considered in women when a vaginal hysterectomy (VH) is not an option. Your surgeon will discuss the risks and benefits of the different surgical techniques at your appointment. Symptoms such as chronic pelvic pain, pressure, and heavy or painful periods can be treated with an LAVH. Generally, recovery time is faster and there are fewer complications after laparoscopic procedures than after open incisional procedures. LET YOUR CAREGIVER KNOW ABOUT:   Allergies to food or medicine.  Medicines taken, including vitamins, herbs, eyedrops, over-the-counter medicines, and creams.  Use of steroids (by mouth or creams).  Previous problems with anesthetics or numbing medicines.  History of bleeding problems or blood clots.  Previous surgery.  Other health problems, including diabetes and kidney problems.  Possibility of pregnancy. RISKS AND COMPLICATIONS  Allergies to medicines.  Difficulty breathing.  Bleeding.  Infection.  Damage to other structures near the uterus and cervix. BEFORE THE PROCEDURE  Ask your caregiver about changing or stopping your regular medicines.  Take certain medicines, such as a colon emptying preparation, as directed.  Do not eat or drink anything for at least 8 hours before your surgery.  Take a shower at home the night before your procedure.  Arrive without jewelry, makeup, nail polish, or any lotions or deodorants.  Stop smoking if you smoke. Stopping will improve your health after surgery.  Arrange for a ride home after surgery and for help at home during recovery. PROCEDURE    An intravenous (IV) access tube will be put into one of your veins in order to give you fluids and medicines.  You will receive medicines to relax you and medicines that make you sleep (general anesthetic).  You may have a flexible tube (catheter) put into your bladder to drain urine.  You may have a tube put through your nose or mouth that goes into your stomach (nasogastric tube). The nasogastric tube removes digestive fluids and prevents you from feeling nauseous and vomiting.  Tight fitting (compression) stockings will be placed on your legs to promote circulation.  Three to four small incisions will be made in the abdomen. An incision is also made in the vagina. Probes and tools are inserted into the small incisions. The uterus and cervix are removed (and possibly your ovaries and fallopian tubes) through the vagina, as well as through the 3 to 4 small incisions that were made in the abdomen.  The vagina is then sewn back to normal.  The procedure takes about 1 to 3 hours. AFTER THE PROCEDURE  Plan to be in the hospital for 1 to 2 days.  You may have abdominal cramping and a sore throat. Your pain will be controlled with medicine.  You may have a liquid diet temporarily. You will most likely return to, and tolerate, your usual diet the day after surgery.  You will be passing urine through a catheter. It will be removed the day after surgery.  Your temperature, breathing rate, heart rate, blood pressure, and oxygen level will be monitored regularly.  You will still wear compression stockings on your legs until you are able to move around.  You will use a special device or do  breathing exercises to keep your lungs clear.  You will be encouraged to walk as soon as possible.  Expect a full recovery in 4 to 6 weeks after surgery. Document Released: 06/29/2011 Document Revised: 10/02/2011 Document Reviewed: 06/29/2011 [facility] Patient Information 2013 [place], [place].

## 2012-11-04 ENCOUNTER — Ambulatory Visit: Payer: 59 | Admitting: Gynecology

## 2012-11-05 ENCOUNTER — Encounter: Payer: Self-pay | Admitting: Women's Health

## 2012-11-11 ENCOUNTER — Telehealth: Payer: Self-pay

## 2012-11-11 NOTE — Telephone Encounter (Signed)
I called patient Friday to let her know Dr. Glenetta Hew spoke with Dr. Darnelle Catalan, her oncologistm, and he agrees with her proceeding with TLH,BSO.  Patient said she would like to schedule mid-May to where she could be recovered enough to attend daughter's graduation.  I have scheduled her for 12/03/12 and left message for her to call me regarding this.

## 2012-11-11 NOTE — Telephone Encounter (Signed)
Patient called. We discussed date and that her daughter has senior luncheon on 12/05/12 and she wants to attend.  She said she would just have to go there and sit for one hour.  I did not encourage her and told her she may not feel up to it as it will only be 48 hours after surgery. I told her she may still have pain at that point and be on pain medication.  I reminded her she would not be able to drive.  She seemed convinced that she would be able to do it.  She left the surgery date scheduled for 12/03/12 and said she would consider it as it really works well with her work schedule.

## 2012-11-12 ENCOUNTER — Telehealth: Payer: Self-pay

## 2012-11-12 NOTE — Telephone Encounter (Signed)
Patient called to say she wants to reschedule her surgery that she scheduled yesterday for May 13.  She has decided to wait until after her daughter's graduation as she fears there will be a lot of events that she may not be able to attend if she does it in May.  I rescheduled her until June 25 7:30am at her request.

## 2012-11-15 ENCOUNTER — Telehealth: Payer: Self-pay | Admitting: *Deleted

## 2012-11-15 NOTE — Telephone Encounter (Signed)
Pt called stating she will be out of town 12/26/12 and wanted to r/s. gv appt for 12/31/12 @3pm . Pt also want to keep her lab schedule as is with no changes...td

## 2012-11-28 ENCOUNTER — Ambulatory Visit: Payer: 59 | Admitting: Gynecology

## 2012-12-11 ENCOUNTER — Inpatient Hospital Stay (HOSPITAL_COMMUNITY)
Admission: EM | Admit: 2012-12-11 | Discharge: 2012-12-13 | DRG: 603 | Disposition: A | Discharge status: Home / Self Care | Payer: 59 | Attending: Internal Medicine | Admitting: Internal Medicine

## 2012-12-11 ENCOUNTER — Encounter (HOSPITAL_COMMUNITY): Payer: Self-pay | Admitting: Emergency Medicine

## 2012-12-11 DIAGNOSIS — I890 Lymphedema, not elsewhere classified: Secondary | ICD-10-CM | POA: Diagnosis present

## 2012-12-11 DIAGNOSIS — M7989 Other specified soft tissue disorders: Secondary | ICD-10-CM

## 2012-12-11 DIAGNOSIS — C50919 Malignant neoplasm of unspecified site of unspecified female breast: Secondary | ICD-10-CM

## 2012-12-11 DIAGNOSIS — D352 Benign neoplasm of pituitary gland: Secondary | ICD-10-CM

## 2012-12-11 DIAGNOSIS — Z87891 Personal history of nicotine dependence: Secondary | ICD-10-CM

## 2012-12-11 DIAGNOSIS — I10 Essential (primary) hypertension: Secondary | ICD-10-CM | POA: Diagnosis present

## 2012-12-11 DIAGNOSIS — N83209 Unspecified ovarian cyst, unspecified side: Secondary | ICD-10-CM

## 2012-12-11 DIAGNOSIS — IMO0002 Reserved for concepts with insufficient information to code with codable children: Principal | ICD-10-CM | POA: Diagnosis present

## 2012-12-11 DIAGNOSIS — L0390 Cellulitis, unspecified: Secondary | ICD-10-CM

## 2012-12-11 DIAGNOSIS — C801 Malignant (primary) neoplasm, unspecified: Secondary | ICD-10-CM

## 2012-12-11 DIAGNOSIS — C50911 Malignant neoplasm of unspecified site of right female breast: Secondary | ICD-10-CM

## 2012-12-11 DIAGNOSIS — L0291 Cutaneous abscess, unspecified: Secondary | ICD-10-CM

## 2012-12-11 HISTORY — DX: Adverse effect of unspecified anesthetic, initial encounter: T41.45XA

## 2012-12-11 LAB — COMPREHENSIVE METABOLIC PANEL
Albumin: 3.3 g/dL — ABNORMAL LOW (ref 3.5–5.2)
Alkaline Phosphatase: 68 U/L (ref 39–117)
BUN: 11 mg/dL (ref 6–23)
Calcium: 8.6 mg/dL (ref 8.4–10.5)
GFR calc Af Amer: >90 mL/min (ref >90)
Glucose, Bld: 135 mg/dL — ABNORMAL HIGH (ref 70–99)
Potassium: 3.7 mEq/L (ref 3.5–5.1)
Sodium: 135 mEq/L (ref 135–145)
Total Protein: 6.3 g/dL (ref 6.0–8.3)

## 2012-12-11 LAB — CBC WITH DIFFERENTIAL/PLATELET
Basophils Relative: 0 % (ref 0–1)
Eosinophils Absolute: 0 10*3/uL (ref 0.0–0.7)
Eosinophils Relative: 0 % (ref 0–5)
MCH: 30.8 pg (ref 26.0–34.0)
MCHC: 34.6 g/dL (ref 30.0–36.0)
MCV: 89 fL (ref 78.0–100.0)
Monocytes Relative: 4 % (ref 3–12)
Neutrophils Relative %: 89 % — ABNORMAL HIGH (ref 43–77)
Platelets: 239 10*3/uL (ref 150–400)

## 2012-12-11 LAB — URINALYSIS, ROUTINE W REFLEX MICROSCOPIC
Glucose, UA: NEGATIVE mg/dL
Ketones, ur: NEGATIVE mg/dL
Leukocytes, UA: NEGATIVE
Nitrite: NEGATIVE
Specific Gravity, Urine: 1.006 (ref 1.005–1.030)
pH: 6 (ref 5.0–8.0)

## 2012-12-11 MED ORDER — ONDANSETRON HCL 4 MG/2ML IJ SOLN: 4.0000 mg | Freq: Three times a day (TID) | INTRAMUSCULAR | Status: AC | PRN

## 2012-12-11 MED ORDER — POTASSIUM CHLORIDE CRYS ER 20 MEQ PO TBCR
20.0000 meq | EXTENDED_RELEASE_TABLET | Freq: Once | ORAL | Status: AC
  Administered 2012-12-11: 20 meq via ORAL
  Filled 2012-12-11: qty 1

## 2012-12-11 MED ORDER — HYDROCHLOROTHIAZIDE 25 MG PO TABS
25.0000 mg | ORAL_TABLET | Freq: Every day | ORAL | Status: DC
  Administered 2012-12-11 – 2012-12-13 (×3): 25 mg via ORAL
  Filled 2012-12-11 (×3): qty 1

## 2012-12-11 MED ORDER — TAMOXIFEN CITRATE 20 MG PO TABS
20.0000 mg | ORAL_TABLET | Freq: Every day | ORAL | Status: DC
  Administered 2012-12-11 – 2012-12-12 (×2): 20 mg via ORAL
  Filled 2012-12-11 (×3): qty 1

## 2012-12-11 MED ORDER — SODIUM CHLORIDE 0.9 % IV SOLN
INTRAVENOUS | Status: DC
  Administered 2012-12-11: 1000 mL via INTRAVENOUS

## 2012-12-11 MED ORDER — SODIUM CHLORIDE 0.9 % IJ SOLN
3.0000 mL | Freq: Two times a day (BID) | INTRAMUSCULAR | Status: DC
  Administered 2012-12-12: 3 mL via INTRAVENOUS

## 2012-12-11 MED ORDER — LISINOPRIL 40 MG PO TABS
40.0000 mg | ORAL_TABLET | Freq: Every day | ORAL | Status: DC
  Administered 2012-12-11: 40 mg via ORAL
  Filled 2012-12-11 (×3): qty 1

## 2012-12-11 MED ORDER — POTASSIUM CHLORIDE 20 MEQ PO PACK
20.0000 meq | PACK | Freq: Once | ORAL | Status: DC
  Filled 2012-12-11: qty 1

## 2012-12-11 MED ORDER — VANCOMYCIN HCL IN DEXTROSE 1-5 GM/200ML-% IV SOLN
1000.0000 mg | Freq: Two times a day (BID) | INTRAVENOUS | Status: DC
  Administered 2012-12-11 – 2012-12-12 (×3): 1000 mg via INTRAVENOUS
  Filled 2012-12-11 (×5): qty 200

## 2012-12-11 MED ORDER — ESCITALOPRAM OXALATE 10 MG PO TABS
10.0000 mg | ORAL_TABLET | Freq: Every day | ORAL | Status: DC
  Administered 2012-12-11 – 2012-12-12 (×2): 10 mg via ORAL
  Filled 2012-12-11 (×3): qty 1

## 2012-12-11 MED ORDER — SODIUM CHLORIDE 0.9 % IV SOLN
Freq: Once | INTRAVENOUS | Status: AC
  Administered 2012-12-11: 10:00:00 via INTRAVENOUS

## 2012-12-11 MED ORDER — ASPIRIN 81 MG PO CHEW
81.0000 mg | CHEWABLE_TABLET | Freq: Every day | ORAL | Status: DC
  Administered 2012-12-11 – 2012-12-12 (×2): 81 mg via ORAL
  Filled 2012-12-11 (×3): qty 1

## 2012-12-11 MED ORDER — ENOXAPARIN SODIUM 60 MG/0.6ML ~~LOC~~ SOLN
50.0000 mg | SUBCUTANEOUS | Status: DC
  Administered 2012-12-11 – 2012-12-12 (×2): 50 mg via SUBCUTANEOUS
  Filled 2012-12-11 (×3): qty 0.6

## 2012-12-11 MED ORDER — HYDROCODONE-ACETAMINOPHEN 5-325 MG PO TABS
1.0000 | ORAL_TABLET | ORAL | Status: DC | PRN
  Administered 2012-12-11: 2 via ORAL
  Administered 2012-12-11: 1 via ORAL
  Administered 2012-12-11 – 2012-12-13 (×4): 2 via ORAL
  Filled 2012-12-11: qty 1
  Filled 2012-12-11 (×5): qty 2

## 2012-12-11 MED ORDER — ACETAMINOPHEN 500 MG PO TABS: 500.0000 mg | ORAL_TABLET | Freq: Four times a day (QID) | ORAL | Status: DC | PRN

## 2012-12-11 MED ORDER — VANCOMYCIN HCL IN DEXTROSE 1-5 GM/200ML-% IV SOLN
1000.0000 mg | INTRAVENOUS | Status: DC
  Administered 2012-12-11: 1000 mg via INTRAVENOUS
  Filled 2012-12-11: qty 200

## 2012-12-11 MED ORDER — ZOLPIDEM TARTRATE 5 MG PO TABS
5.0000 mg | ORAL_TABLET | Freq: Every evening | ORAL | Status: DC | PRN
  Administered 2012-12-11 – 2012-12-12 (×2): 5 mg via ORAL
  Filled 2012-12-11 (×2): qty 1

## 2012-12-11 MED ORDER — VANCOMYCIN HCL IN DEXTROSE 1-5 GM/200ML-% IV SOLN
1000.0000 mg | INTRAVENOUS | Status: AC
  Administered 2012-12-11: 1000 mg via INTRAVENOUS
  Filled 2012-12-11 (×2): qty 200

## 2012-12-11 MED ORDER — CALCIUM CARBONATE-VITAMIN D 500-200 MG-UNIT PO TABS
1.0000 | ORAL_TABLET | Freq: Every day | ORAL | Status: DC
  Administered 2012-12-11 – 2012-12-13 (×3): 1 via ORAL
  Filled 2012-12-11 (×3): qty 1

## 2012-12-11 NOTE — Progress Notes (Signed)
   CARE MANAGEMENT ED NOTE 12/11/2012  Patient:  [name] Gibson, [name] Gibson   Account Number:  [id number]  Date Initiated:  12/11/2012  Documentation initiated by:  Edd Arbour  Subjective/Objective Assessment:     Subjective/Objective Assessment Detail:     Action/Plan:   Action/Plan Detail:   UR completed cellulitis arm onc pt   Anticipated DC Date:  12/11/2012     Status Recommendation to Physician:   Result of Recommendation:    Other ED Services  Consult Working Plan    DC Planning Services  Other    Choice offered to / List presented to:            Status of service:  Completed, signed off  ED Comments:   ED Comments Detail:  UR completed

## 2012-12-11 NOTE — ED Notes (Signed)
Pt had 3 gel tabs of Advil at 6:07 this morning and 2 tabs of Tylenol at 6:30 this morning.

## 2012-12-11 NOTE — ED Notes (Signed)
Pt states that she started having chills about 0200 this morning.  States that when she woke up this morning, she took her temp and it was 103.  Pt has hx of breast cancer and lymphedema.

## 2012-12-11 NOTE — H&P (Signed)
PCP:   Cala Bradford, MD   Chief Complaint:  Right arm swelling and redness  HPI: 57 yr old female with h/o breast cancer s/p chemotherapy, radiation and right mastectomy who has chronic lymphedema came to the hospital with one day h/o redness, pain of the right arm, fever which started last night. Patient says her arm is usually swollen due to chronic lymphedema, but  The pain and redness are new.  She denies nausea, vomiting, diarrhea, no chest pain, or shortness of breath.  Allergies:   Allergies  Allergen Reactions  . Sulfa Antibiotics       Past Medical History  Diagnosis Date  . 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 Ovarian cyst   . Cancer     Lobular breast cancer -Right  . Pituitary microadenoma   . Hypertension   . Swelling of arm   . Complication of anesthesia     some nausea after second mastectomy surgery    Past Surgical History  Procedure Laterality Date  . Tonsillectomy    . Anal fistula repair    . Breast surgery      Right Mastectomy  . Cervical cone biopsy    . Cholecystectomy    . Colposcopy      Prior to Admission medications   Medication Sig Start Date End Date Taking? Authorizing Provider  acetaminophen (TYLENOL) 500 MG tablet Take 500 mg by mouth every 6 (six) hours as needed for pain.   Yes Historical Provider, MD  aspirin 81 MG tablet Take 81 mg by mouth daily.   Yes Historical Provider, MD  Calcium Carbonate-Vitamin D (CALCIUM + D PO) Take 1 tablet by mouth 2 (two) times daily.    Yes Historical Provider, MD  escitalopram (LEXAPRO) 10 MG tablet Take 1 tablet (10 mg total) by mouth daily. 09/25/12  Yes Harrington Challenger, NP  hydrochlorothiazide (HYDRODIURIL) 25 MG tablet Take 25 mg by mouth daily.   Yes Historical Provider, MD  LISINOPRIL PO Take 40 mg by mouth every morning.    Yes Historical Provider, MD  Potassium Chloride (KLOR-CON PO) Take 20 mEq by mouth.    Yes Historical Provider, MD  tamoxifen (NOLVADEX) 20 MG tablet  TAKE 1 TABLET BY MOUTH DAILY 01/02/12  Yes Lowella Dell, MD  zolpidem (AMBIEN) 5 MG tablet Take 5 mg by mouth at bedtime as needed.   Yes Historical Provider, MD    Social History:  reports that she has quit smoking. She has never used smokeless tobacco. She reports that she does not drink alcohol. Her drug history is not on file.  Family History  Problem Relation Age of Onset  . Hypertension Mother   . Melanoma Mother   . Diabetes Father   . Hypertension Father   . Heart disease Father   . Parkinsonism Father   . Breast cancer Cousin 49    passed away form disease  . COPD Brother   . Breast cancer Cousin     Review of Systems:  HEENT: Denies headache, blurred vision, runny nose, sore throat,  Neck: Denies thyroid problems,lymphadenopathy Chest : Denies shortness of breath, no history of COPD Heart : Denies Chest pain,  coronary arterey disease GI: Denies  nausea, vomiting, diarrhea, constipation GU: Denies dysuria, urgency, frequency of urination, hematuria Neuro: Denies stroke, seizures, syncope Psych: Denies depression, anxiety, hallucinations   Physical Exam: Blood pressure 130/77, pulse 123, temperature 99.1 F (37.3 C), temperature source Oral, resp. rate 17, SpO2  97.00%. Constitutional:   Patient is a well-developed and well-nourished female  in no acute distress and cooperative with exam. Head: Normocephalic and atraumatic Mouth: Mucus membranes moist Eyes: PERRL, EOMI, conjunctivae normal Neck: Supple, No Thyromegaly Cardiovascular: RRR, S1 normal, S2 normal Pulmonary/Chest: CTAB, no wheezes, rales, or rhonchi Abdominal: Soft. Non-tender, non-distended, bowel sounds are normal, no masses, organomegaly, or guarding present.  Neurological: A&O x3, Strenght is normal and symmetric bilaterally, cranial nerve II-XII are grossly intact, no focal motor deficit, sensory intact to light touch bilaterally.  Extremities : Right arm has marked edema, erythema extending in  whole arm, warm to touch, mild tenderness on palpation   Labs on Admission:  Results for orders placed during the hospital encounter of 12/11/12 (from the past 48 hour(s))  URINALYSIS, ROUTINE W REFLEX MICROSCOPIC     Status: None   Collection Time    12/11/12  8:09 AM      Result Value Range   Color, Urine YELLOW  YELLOW   APPearance CLEAR  CLEAR   Specific Gravity, Urine 1.006  1.005 - 1.030   pH 6.0  5.0 - 8.0   Glucose, UA NEGATIVE  NEGATIVE mg/dL   Hgb urine dipstick NEGATIVE  NEGATIVE   Bilirubin Urine NEGATIVE  NEGATIVE   Ketones, ur NEGATIVE  NEGATIVE mg/dL   Protein, ur NEGATIVE  NEGATIVE mg/dL   Urobilinogen, UA 0.2  0.0 - 1.0 mg/dL   Nitrite NEGATIVE  NEGATIVE   Leukocytes, UA NEGATIVE  NEGATIVE   Comment: MICROSCOPIC NOT DONE ON URINES WITH NEGATIVE PROTEIN, BLOOD, LEUKOCYTES, NITRITE, OR GLUCOSE <1000 mg/dL.  CBC WITH DIFFERENTIAL     Status: Abnormal   Collection Time    12/11/12  8:40 AM      Result Value Range   WBC 18.0 (*) 4.0 - 10.5 K/uL   RBC 4.28  3.87 - 5.11 MIL/uL   Hemoglobin 13.2  12.0 - 15.0 g/dL   HCT 96.0  45.4 - 09.8 %   MCV 89.0  78.0 - 100.0 fL   MCH 30.8  26.0 - 34.0 pg   MCHC 34.6  30.0 - 36.0 g/dL   RDW 11.9  14.7 - 82.9 %   Platelets 239  150 - 400 K/uL   Neutrophils Relative % 89 (*) 43 - 77 %   Neutro Abs 16.0 (*) 1.7 - 7.7 K/uL   Lymphocytes Relative 7 (*) 12 - 46 %   Lymphs Abs 1.3  0.7 - 4.0 K/uL   Monocytes Relative 4  3 - 12 %   Monocytes Absolute 0.7  0.1 - 1.0 K/uL   Eosinophils Relative 0  0 - 5 %   Eosinophils Absolute 0.0  0.0 - 0.7 K/uL   Basophils Relative 0  0 - 1 %   Basophils Absolute 0.0  0.0 - 0.1 K/uL  COMPREHENSIVE METABOLIC PANEL     Status: Abnormal   Collection Time    12/11/12  8:40 AM      Result Value Range   Sodium 135  135 - 145 mEq/L   Potassium 3.7  3.5 - 5.1 mEq/L   Chloride 99  96 - 112 mEq/L   CO2 25  19 - 32 mEq/L   Glucose, Bld 135 (*) 70 - 99 mg/dL   BUN 11  6 - 23 mg/dL   Creatinine, Ser  5.62  0.50 - 1.10 mg/dL   Calcium 8.6  8.4 - 13.0 mg/dL   Total Protein 6.3  6.0 - 8.3 g/dL  Albumin 3.3 (*) 3.5 - 5.2 g/dL   AST 17  0 - 37 U/L   ALT 14  0 - 35 U/L   Alkaline Phosphatase 68  39 - 117 U/L   Total Bilirubin 0.3  0.3 - 1.2 mg/dL   GFR calc non Af Amer >90  >90 mL/min   GFR calc Af Amer >90  >90 mL/min   Comment:            The eGFR has been calculated     using the CKD EPI equation.     This calculation has not been     validated in all clinical     situations.     eGFR's persistently     <90 mL/min signify     possible Chronic Kidney Disease.    Radiological Exams on Admission: No results found.  Assessment/Plan Active Problems:   Hypertension   Swelling of arm   Breast cancer   Cellulitis  Cellulitis Will admit the patient and start her on vancomycin IV. Patient's WBC is elevated to 18,000 We'll follow the CBC in the morning  Hypertension Patient currently takes lisinopril and HCTZ at home Will continue the home medications  Breast cancer status post right mastectomy Patient currently takes tamoxifen at home We'll continue tamoxifen the hospital  DVT prophylaxis Lovenox  CODE STATUS Presumed full code  Time Spent on Admission: 75 min  LAMA,GAGAN S [facility] Pager: [phone] 12/11/2012, 10:22 AM

## 2012-12-11 NOTE — ED Provider Notes (Signed)
History     CSN: [id number]  Arrival date & time 12/11/12  8469   First MD Initiated Contact with Patient 12/11/12 [phone]      Chief Complaint  Patient presents with  . Chills  . Fever    (Consider location/radiation/quality/duration/timing/severity/associated sxs/prior treatment) HPI Comments: 57 y.o. female with PMHx of right breast ductal 4/lobular carcinoma in situ (2009) status post mastectomy and right sided lymphedema. Pt states she woke up at 2am feeling chilled and "not well." After sleeping through the night, pt woke up about 5am feeling worse. Temp was 103. Pt has taken 3 Advil and 2 Tylenol which brought the temp down and has pt feeling a little better. Pt admits increased pain, redness, warmth, swelling to right arm. Pain is described as sharp and "tight." Called oncologist this morning who told her to come to the ED for evaluation. Admits mild shortness of breath, but attributes to anxiety about getting ready to come to ED. Pt states has resolved. Denies nausea, vomiting, chest pain, dyspnea, diarrhea, headache, numbness.   Pt currently taking tamoxifen, last radiation/chemo was 2009/2010  Pt is scheduled for total laparoscopic hysterectomy with bilateral salpingo-oophorectomy in June s/p endometrial biopsy. Endometrial thickening, persistent left ovarian cyst.   PCP is Dr. [name] [facility]) Oncologist is Dr. Ruthann Cancer [facility]) (pt called his office this morning who said to come in)    Past Medical History  Diagnosis Date  . 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 Ovarian cyst   . Cancer     Lobular breast cancer -Right  . Pituitary microadenoma   . Hypertension   . Swelling of arm     Past Surgical History  Procedure Laterality Date  . Tonsillectomy    . Anal fistula repair    . Breast surgery      Right Mastectomy  . Cervical cone biopsy    . Cholecystectomy    . Colposcopy      Family History  Problem Relation  Age of Onset  . Hypertension Mother   . Melanoma Mother   . Diabetes Father   . Hypertension Father   . Heart disease Father   . Parkinsonism Father   . Breast cancer Cousin 49    passed away form disease  . COPD Brother   . Breast cancer Cousin     History  Substance Use Topics  . Smoking status: Former Games developer  . Smokeless tobacco: Never Used  . Alcohol Use: No    OB History   Grav Para Term Preterm Abortions TAB SAB Ect Mult Living   2 2 2       2       Review of Systems  Constitutional: Negative for fever and diaphoresis.  HENT: Negative for neck pain and neck stiffness.   Eyes: Negative for visual disturbance.  Respiratory: Negative for apnea, chest tightness and shortness of breath.   Cardiovascular: Negative for chest pain, palpitations and leg swelling.  Gastrointestinal: Negative for nausea, vomiting, abdominal pain, diarrhea and constipation.  Genitourinary: Negative for dysuria and hematuria.  Musculoskeletal: Positive for myalgias and arthralgias. Negative for gait problem.  Skin: Positive for color change.       Increased redness, swelling, pain to right arm   Neurological: Negative for dizziness, weakness, light-headedness, numbness and headaches.    Allergies  Sulfa antibiotics  Home Medications   Current Outpatient Rx  Name  Route  Sig  Dispense  Refill  . Calcium  Carbonate-Vitamin D (CALCIUM + D PO)   Oral   Take by mouth.         . escitalopram (LEXAPRO) 10 MG tablet   Oral   Take 1 tablet (10 mg total) by mouth daily.   90 tablet   4   . fish oil-omega-3 fatty acids 1000 MG capsule   Oral   Take 1 g by mouth daily.         Marland Kitchen HYDROCHLOROTHIAZIDE PO   Oral   Take by mouth.         Marland Kitchen LISINOPRIL PO   Oral   Take by mouth.         . Potassium Chloride (KLOR-CON PO)   Oral   Take by mouth.         . tamoxifen (NOLVADEX) 20 MG tablet      TAKE 1 TABLET BY MOUTH DAILY   90 tablet   4   . zolpidem (AMBIEN) 5 MG tablet    Oral   Take 5 mg by mouth at bedtime as needed.           There were no vitals taken for this visit.  Physical Exam  Nursing note and vitals reviewed. Constitutional: She is oriented to person, place, and time. She appears well-developed and well-nourished. No distress.  HENT:  Head: Normocephalic and atraumatic.  Eyes: Conjunctivae and EOM are normal.  Neck: Normal range of motion. Neck supple.  No meningeal signs  Cardiovascular: Normal rate, regular rhythm and normal heart sounds.  Exam reveals no gallop and no friction rub.   No murmur heard. Pulmonary/Chest: Effort normal and breath sounds normal. No respiratory distress. She has no wheezes. She has no rales. She exhibits no tenderness.  Abdominal: Soft. Bowel sounds are normal. She exhibits no distension. There is no tenderness. There is no rebound and no guarding.  Genitourinary:  Right breast mastectomy  Musculoskeletal: Normal range of motion. She exhibits edema and tenderness.  Right upper extremity increased erythema, swelling, tenderness, warm to touch, anterior > posterior  Neurological: She is alert and oriented to person, place, and time. No cranial nerve deficit.  Speech is clear and goal oriented, follows commands Sensation normal to light touch  Moves extremities without ataxia, coordination intact Normal gait and balance Normal strength in upper and lower extremities bilaterally including dorsiflexion and plantar flexion, strong and equal grip strength   Skin: Skin is warm and dry. She is not diaphoretic. There is erythema.  Right upper extremity  Psychiatric: She has a normal mood and affect.    ED Course  Procedures (including critical care time)  Labs Reviewed  CBC WITH DIFFERENTIAL - Abnormal; Notable for the following:    WBC 18.0 (*)    Neutrophils Relative % 89 (*)    Neutro Abs 16.0 (*)    Lymphocytes Relative 7 (*)    All other components within normal limits  COMPREHENSIVE METABOLIC PANEL -  Abnormal; Notable for the following:    Glucose, Bld 135 (*)    Albumin 3.3 (*)    All other components within normal limits  URINE CULTURE  URINALYSIS, ROUTINE W REFLEX MICROSCOPIC   No results found.   1. Cellulitis       MDM  Increased erythema, swelling, tenderness, anterior > posterior to right upper extremity. Pt states baseline lymphedema, but sx have increased with fever. Warmth to touch concerning. Discussed pt case with Dr. Bernette Mayers. Will get labs and re-evaluate. Pt states pain is under  control. No nausea.   Labs returned increased WBC along with physical presentation. Will start Vancomycin and admit pt.   The patient appears reasonably stabilized for admission considering the current resources, flow, and capabilities available in the ED at this time, and I doubt any other [facility] requiring further screening and/or treatment in the ED prior to admission.  Discussed admission with Dr. Bernette Mayers who agrees with plan. Discussed admission with Dr. Sharl Ma who will admit to telemetry.        Glade Nurse, PA-C 12/11/12 1013

## 2012-12-11 NOTE — Progress Notes (Signed)
ANTIBIOTIC CONSULT NOTE - INITIAL  Pharmacy Consult for Vancomycin Indication: cellulitis  Allergies  Allergen Reactions  . Sulfa Antibiotics     Patient Measurements: Height: 5\' 7"  (170.2 cm) Weight: 239 lb 13.8 oz (108.8 kg) IBW/kg (Calculated) : 61.6  Vital Signs: Temp: 98.3 F (36.8 C) (05/21 1138) Temp src: Oral (05/21 1138) BP: 130/67 mmHg (05/21 1138) Pulse Rate: 97 (05/21 1138) Intake/Output from previous day:   Intake/Output from this shift:    Labs:  Recent Labs  12/11/12 0840  WBC 18.0*  HGB 13.2  PLT 239  CREATININE 0.63   Estimated Creatinine Clearance: 102.2 ml/min (by C-G formula based on Cr of 0.63). No results found for this basename: VANCOTROUGH, VANCOPEAK, VANCORANDOM, GENTTROUGH, GENTPEAK, GENTRANDOM, TOBRATROUGH, TOBRAPEAK, TOBRARND, AMIKACINPEAK, AMIKACINTROU, AMIKACIN,  in the last 72 hours   Microbiology: No results found for this or any previous visit (from the past 720 hour(s)).  Medical History: Past Medical History  Diagnosis Date  . 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 Ovarian cyst   . Cancer     Lobular breast cancer -Right  . Pituitary microadenoma   . Hypertension   . Swelling of arm   . Complication of anesthesia     some nausea after second mastectomy surgery     Assessment:  71 yof with h/o R breast cancer s/p chemo/radiation (2009/2010) presented 5/21 with c/o chills, fever (Temp 103). Pt found with increased erythema, tenderness to the R upper extremity. MD order Vancomycin for cellulitis. No h/o DM noted.  Afebrile, WBC 18K, Scr 0.63 for CG > 100, normalized CrCl of 91 ml/min.   Urine culture pending  Patient received a dose of Vancomycin 1gm IV at 1000 AM   Goal of Therapy:  Vancomycin trough level 10-15 mcg/ml  Plan:   Give another Vancomycin 1000 mg for loading dose of 2000 mg given obesity.   Vancomycin 1gm IV q12h  F/u with Vancomycin trough at steady state  F/u to add gram  negative coverage if no improvement seen  Geoffry Paradise, PharmD, BCPS Pager: [phone] 12:03 PM Pharmacy #: [id number]

## 2012-12-11 NOTE — ED Notes (Signed)
Admitting physician at bedside

## 2012-12-11 NOTE — ED Provider Notes (Signed)
Medical screening examination/treatment/procedure(s) were conducted as a shared visit with non-physician practitioner(s) and myself.  I personally evaluated the patient during the encounter  Pt with lymphedema from previous mastectomy has chronic swelling, but now also has redness, warmth and fever. WBC 18, will admit for IV Abx. Hemodynamically stable.   Charles B. Bernette Mayers, MD 12/11/12 1016

## 2012-12-12 DIAGNOSIS — C50919 Malignant neoplasm of unspecified site of unspecified female breast: Secondary | ICD-10-CM

## 2012-12-12 DIAGNOSIS — L0291 Cutaneous abscess, unspecified: Secondary | ICD-10-CM

## 2012-12-12 DIAGNOSIS — L0390 Cellulitis, unspecified: Secondary | ICD-10-CM

## 2012-12-12 DIAGNOSIS — I10 Essential (primary) hypertension: Secondary | ICD-10-CM

## 2012-12-12 LAB — COMPREHENSIVE METABOLIC PANEL
Albumin: 2.9 g/dL — ABNORMAL LOW (ref 3.5–5.2)
Alkaline Phosphatase: 121 U/L — ABNORMAL HIGH (ref 39–117)
BUN: 9 mg/dL (ref 6–23)
CO2: 30 mEq/L (ref 19–32)
Chloride: 98 mEq/L (ref 96–112)
Creatinine, Ser: 0.64 mg/dL (ref 0.50–1.10)
GFR calc Af Amer: >90 mL/min (ref >90)
GFR calc non Af Amer: >90 mL/min (ref >90)
Glucose, Bld: 133 mg/dL — ABNORMAL HIGH (ref 70–99)
Potassium: 3.3 mEq/L — ABNORMAL LOW (ref 3.5–5.1)
Total Bilirubin: 0.5 mg/dL (ref 0.3–1.2)

## 2012-12-12 LAB — URINE CULTURE: Colony Count: 2000

## 2012-12-12 LAB — CBC
HCT: 36.5 % (ref 36.0–46.0)
Hemoglobin: 12.1 g/dL (ref 12.0–15.0)
MCV: 90.3 fL (ref 78.0–100.0)
RBC: 4.04 MIL/uL (ref 3.87–5.11)
WBC: 10.9 10*3/uL — ABNORMAL HIGH (ref 4.0–10.5)

## 2012-12-12 MED ORDER — POTASSIUM CHLORIDE CRYS ER 20 MEQ PO TBCR
40.0000 meq | EXTENDED_RELEASE_TABLET | Freq: Once | ORAL | Status: AC
  Administered 2012-12-12: 40 meq via ORAL
  Filled 2012-12-12: qty 2

## 2012-12-12 NOTE — Progress Notes (Signed)
[facility] PROGRESS NOTE  [name] Gibson WUJ:[id number] DOB: October 05, 1955 DOA: 12/11/2012 PCP: Cala Bradford, MD  Assessment/Plan: Active Problems:   Hypertension   Swelling of arm   Breast cancer   Cellulitis    1. Cellulitis: Patient has a known history of RUE lymphedema, complicating right mastectomy. She presented with redness, pain of RUE starting early AM of 12/11/12, and had fever up to 104, at home. Wcc was elevated at 18.0, on admission. On iv vancomycin, now day#2 and improvement ii erythema is already evident. No pyrexia was documented overnight, and wcc has trended down.  2. Hypertension: Controlled on pre-admission antihypertensives. Patient currently takes lisinopril and HCTZ at home. Will continue the home medications.  3. Breast cancer status post right mastectomy: Diagnosed about 5 years ago, per patient. Continued on Tamoxifen at home.    Code Status: Full Code.  Family Communication:  Disposition Plan: To be determined.    Brief narrative: 57 yr old female with Pituitary microadenoma, HTN, previous CIN-3, right breast cancer s/p chemotherapy, radiation and right mastectomy, chronic RUE lymphedema came to the hospital with one day h/o redness, pain of the right arm, fever which started last night. Patient says her arm is usually swollen due to chronic lymphedema, but The pain and redness are new. She denies nausea, vomiting, diarrhea, no chest pain, or shortness of breath. Admitted for further management.    Consultants:  N/A.   Procedures:  N/A.   Antibiotics:  Vancomycin 5/21//14>>>  HPI/Subjective: Feels better.   Objective: Vital signs in last 24 hours: Temp:  [98.3 F (36.8 C)-99.1 F (37.3 C)] 98.3 F (36.8 C) (05/22 0555) Pulse Rate:  [78-123] 78 (05/22 0555) Resp:  [17-24] 20 (05/22 0555) BP: (109-134)/(57-77) 109/57 mmHg (05/22 0555) SpO2:  [97 %-98 %] 97 % (05/22 0555) Weight:  [108.8 kg (239 lb 13.8 oz)] 108.8 kg (239 lb 13.8 oz)  (05/21 1138) Weight change:  Last BM Date: 12/10/12  Intake/Output from previous day: 05/21 0701 - 05/22 0700 In: 240 [P.O.:240] Out: -      Physical Exam: General: Comfortable, alert, communicative, fully oriented, not short of breath at rest.  HEENT:  No clinical pallor, no jaundice, no conjunctival injection or discharge. Hydration is fair.  NECK:  Supple, JVP not seen, no carotid bruits, no palpable lymphadenopathy, no palpable goiter. CHEST:  Clinically clear to auscultation, no wheezes, no crackles. HEART:  Sounds 1 and 2 heard, normal, regular, no murmurs. ABDOMEN:  Full, soft, non-tender, no palpable organomegaly, no palpable masses, normal bowel sounds. GENITALIA:  Not examined. UPPER EXTREMITIES: Marked lymphedema RUE, as well as erythema, involving forearm and upper arm.  LOWER EXTREMITIES:  No pitting edema, palpable peripheral pulses. MUSCULOSKELETAL SYSTEM:  Unremarkable. CENTRAL NERVOUS SYSTEM:  No focal neurologic deficit on gross examination.  Lab Results:  Recent Labs  12/11/12 0840 12/12/12 0558  WBC 18.0* 10.9*  HGB 13.2 12.1  HCT 38.1 36.5  PLT 239 212    Recent Labs  12/11/12 0840 12/12/12 0558  NA 135 136  K 3.7 3.3*  CL 99 98  CO2 25 30  GLUCOSE 135* 133*  BUN 11 9  CREATININE 0.63 0.64  CALCIUM 8.6 8.6   No results found for this or any previous visit (from the past 240 hour(s)).   Studies/Results: No results found.  Medications: Scheduled Meds: . aspirin  81 mg Oral Daily  . calcium-vitamin D  1 tablet Oral Daily  . enoxaparin (LOVENOX) injection  50 mg Subcutaneous Q24H  .  escitalopram  10 mg Oral Daily  . hydrochlorothiazide  25 mg Oral Daily  . lisinopril  40 mg Oral Daily  . sodium chloride  3 mL Intravenous Q12H  . tamoxifen  20 mg Oral Daily  . vancomycin  1,000 mg Intravenous Q12H   Continuous Infusions: . sodium chloride 1,000 mL (12/11/12 1414)   PRN Meds:.acetaminophen, HYDROcodone-acetaminophen, zolpidem     LOS: 1 day   [name],CHRISTOPHER  [facility] Pager [phone]. If 8PM-8AM, please contact night-coverage at [email or web address], password [facility] 12/12/2012, 7:23 AM  LOS: 1 day

## 2012-12-13 LAB — BASIC METABOLIC PANEL
Chloride: 100 mEq/L (ref 96–112)
Creatinine, Ser: 0.65 mg/dL (ref 0.50–1.10)
GFR calc Af Amer: >90 mL/min (ref >90)
Sodium: 138 mEq/L (ref 135–145)

## 2012-12-13 LAB — CBC
MCV: 91.3 fL (ref 78.0–100.0)
Platelets: 233 10*3/uL (ref 150–400)
RDW: 12.8 % (ref 11.5–15.5)
WBC: 7.9 10*3/uL (ref 4.0–10.5)

## 2012-12-13 MED ORDER — DOXYCYCLINE HYCLATE 50 MG PO CAPS: 100.0000 mg | ORAL_CAPSULE | Freq: Two times a day (BID) | ORAL | Status: DC

## 2012-12-13 MED ORDER — OXYCODONE HCL 5 MG PO TABS: 5.0000 mg | ORAL_TABLET | ORAL | Status: DC | PRN

## 2012-12-13 NOTE — Progress Notes (Signed)
Patient discharged home with husband, discharge instructions given and explained to patient and she verbalized understanding, denies any pain/distress, skin intact, with edema and redness on right arm. Patient accompanied home by husband and transported to the car via wheelchair by staff.

## 2012-12-13 NOTE — Discharge Summary (Addendum)
Physician Discharge Summary  [name] Gibson:[id number] DOB: 08-01-1955 DOA: 12/11/2012  PCP: Cala Bradford, MD  Admit date: 12/11/2012 Discharge date: 12/13/2012  Time spent: 40 minutes  Recommendations for Outpatient Follow-up:  1. Follow up with primary MD.  2. Follow up with primary oncologist, per prior appointment.   Discharge Diagnoses:  Active Problems:   Hypertension   Swelling of arm   Breast cancer   Cellulitis   Discharge Condition: Satisfactory.   Diet recommendation: Heart-Healthy.  Filed Weights   12/11/12 1138  Weight: 108.8 kg (239 lb 13.8 oz)    History of present illness:  57 yr old female with Pituitary microadenoma, HTN, previous CIN-3, right breast cancer s/p chemotherapy, radiation and right mastectomy, chronic RUE lymphedema came to the hospital with one day h/o redness, pain of the right arm, fever which started last night. Patient says her arm is usually swollen due to chronic lymphedema, but The pain and redness are new. She denies nausea, vomiting, diarrhea, no chest pain, or shortness of breath. Admitted for further management.    Hospital Course:  1. Cellulitis: Patient has a known history of RUE lymphedema, complicating previous right mastectomy. She presented with redness, pain of RUE starting early AM of 12/11/12, and had fever up to 104, at home. Wcc was elevated at 18.0, on admission. Managed with iv vancomycin, with dramatic improvement. No pyrexia was documented during this hospitalization, wcc had normalized at 7.9 as of 12/13/12, and local inflammatory phenomena was practically resolved. Have transitioned to oral Doxycycline for a further 7 days, to conclude a 10-day course of antibiotic therapy on 12/20/12.  2. Hypertension: Controlled on pre-admission antihypertensives.  3. Breast cancer status post right mastectomy: Diagnosed about 5 years ago, per patient. Continued on Tamoxifen. Patient will follow up routinely with primary oncologist, on  discharge.    Procedures:  N/A.   Consultations:  N/A.   Discharge Exam: Filed Vitals:   12/12/12 0555 12/12/12 1421 12/12/12 2020 12/13/12 0526  BP: 109/57 111/54 83/66 121/66  Pulse: 78 76 85 71  Temp: 98.3 F (36.8 C) 98.1 F (36.7 C) 99.1 F (37.3 C) 98 F (36.7 C)  TempSrc: Oral Oral Oral Oral  Resp: 20 20 18 18   Height:      Weight:      SpO2: 97% 100% 99% 98%    General: Comfortable, alert, communicative, fully oriented, not short of breath at rest.  HEENT: No clinical pallor, no jaundice, no conjunctival injection or discharge. Hydration is fair.  NECK: Supple, JVP not seen, no carotid bruits, no palpable lymphadenopathy, no palpable goiter.  CHEST: Clinically clear to auscultation, no wheezes, no crackles.  HEART: Sounds 1 and 2 heard, normal, regular, no murmurs.  ABDOMEN: Full, soft, non-tender, no palpable organomegaly, no palpable masses, normal bowel sounds.  GENITALIA: Not examined.  UPPER EXTREMITIES: Marked lymphedema RUE. However, erythema has dramatically resolved, and no induration or tenderness is apparent.  LOWER EXTREMITIES: No pitting edema, palpable peripheral pulses.  MUSCULOSKELETAL SYSTEM: Unremarkable.  CENTRAL NERVOUS SYSTEM: No focal neurologic deficit on gross examination.  Discharge Instructions      Discharge Orders   Future Appointments Provider Department Dept Phone   12/19/2012 3:30 PM Krista Blue [facility] MEDICAL ONCOLOGY [phone]   12/31/2012 3:00 PM Lowella Dell, MD [facility] MEDICAL ONCOLOGY [phone]   01/08/2013 4:00 PM Ok Edwards, MD [facility] [phone]   Future Orders Complete By Expires     Diet -  low sodium heart healthy  As directed     Increase activity slowly  As directed         Medication List    TAKE these medications       acetaminophen 500 MG tablet  Commonly known as:  TYLENOL  Take 500 mg by mouth every 6 (six) hours as  needed for pain.     AMBIEN 5 MG tablet  Generic drug:  zolpidem  Take 5 mg by mouth at bedtime as needed.     aspirin 81 MG tablet  Take 81 mg by mouth daily.     CALCIUM + D PO  Take 1 tablet by mouth 2 (two) times daily.     doxycycline 50 MG capsule  Commonly known as:  VIBRAMYCIN  Take 2 capsules (100 mg total) by mouth 2 (two) times daily.     escitalopram 10 MG tablet  Commonly known as:  LEXAPRO  Take 1 tablet (10 mg total) by mouth daily.     hydrochlorothiazide 25 MG tablet  Commonly known as:  HYDRODIURIL  Take 25 mg by mouth daily.     KLOR-CON PO  Take 20 mEq by mouth.     LISINOPRIL PO  Take 40 mg by mouth every morning.     oxyCODONE 5 MG immediate release tablet  Commonly known as:  Oxy IR/ROXICODONE  Take 1 tablet (5 mg total) by mouth every 4 (four) hours as needed for pain.     tamoxifen 20 MG tablet  Commonly known as:  NOLVADEX  TAKE 1 TABLET BY MOUTH DAILY       Allergies  Allergen Reactions  . Sulfa Antibiotics    Follow-up Information   Call Cala Bradford, MD.   Contact information:   [address] [place] [address] [phone]       Please follow up. (Follow up routinely with your primary oncologist.)        The results of significant diagnostics from this hospitalization (including imaging, microbiology, ancillary and laboratory) are listed below for reference.    Significant Diagnostic Studies: No results found.  Microbiology: Recent Results (from the past 240 hour(s))  URINE CULTURE     Status: None   Collection Time    12/11/12  8:09 AM      Result Value Range Status   Specimen Description URINE, CLEAN CATCH   Final   Special Requests NONE   Final   Culture  Setup Time 12/11/2012 12:45   Final   Colony Count 2,000 COLONIES/ML   Final   Culture INSIGNIFICANT GROWTH   Final   Report Status 12/12/2012 FINAL   Final     Labs: Basic Metabolic Panel:  Recent Labs Lab 12/11/12 0840 12/12/12 0558  12/13/12 0450  NA 135 136 138  K 3.7 3.3* 3.7  CL 99 98 100  CO2 25 30 31   GLUCOSE 135* 133* 115*  BUN 11 9 11   CREATININE 0.63 0.64 0.65  CALCIUM 8.6 8.6 8.6   Liver Function Tests:  Recent Labs Lab 12/11/12 0840 12/12/12 0558  AST 17 56*  ALT 14 49*  ALKPHOS 68 121*  BILITOT 0.3 0.5  PROT 6.3 6.1  ALBUMIN 3.3* 2.9*   No results found for this basename: LIPASE, AMYLASE,  in the last 168 hours No results found for this basename: AMMONIA,  in the last 168 hours CBC:  Recent Labs Lab 12/11/12 0840 12/12/12 0558 12/13/12 0450  WBC 18.0* 10.9* 7.9  NEUTROABS 16.0*  --   --  HGB 13.2 12.1 12.6  HCT 38.1 36.5 36.7  MCV 89.0 90.3 91.3  PLT 239 212 233   Cardiac Enzymes: No results found for this basename: CKTOTAL, CKMB, CKMBINDEX, TROPONINI,  in the last 168 hours BNP: BNP (last 3 results) No results found for this basename: PROBNP,  in the last 8760 hours CBG: No results found for this basename: GLUCAP,  in the last 168 hours     Signed:  Erryn Dickison,CHRISTOPHER  [facility] 12/13/2012, 11:00 AM

## 2012-12-17 ENCOUNTER — Telehealth: Payer: Self-pay | Admitting: Oncology

## 2012-12-17 NOTE — Telephone Encounter (Signed)
Changed time of 5/29 app to 2:45pm due to [employer]. lmonvm informing pt (home #) - cell vm not set up.

## 2012-12-19 ENCOUNTER — Other Ambulatory Visit: Payer: 59 | Admitting: Lab

## 2012-12-20 ENCOUNTER — Other Ambulatory Visit (HOSPITAL_BASED_OUTPATIENT_CLINIC_OR_DEPARTMENT_OTHER): Payer: 59

## 2012-12-20 DIAGNOSIS — C50919 Malignant neoplasm of unspecified site of unspecified female breast: Secondary | ICD-10-CM

## 2012-12-20 LAB — COMPREHENSIVE METABOLIC PANEL (CC13)
ALT: 25 U/L (ref 0–55)
AST: 14 U/L (ref 5–34)
Albumin: 3.2 g/dL — ABNORMAL LOW (ref 3.5–5.0)
Calcium: 9 mg/dL (ref 8.4–10.4)
Chloride: 103 mEq/L (ref 98–107)
Potassium: 3.8 mEq/L (ref 3.5–5.1)
Total Protein: 6.5 g/dL (ref 6.4–8.3)

## 2012-12-20 LAB — CBC WITH DIFFERENTIAL/PLATELET
BASO%: 1 % (ref 0.0–2.0)
HCT: 39.6 % (ref 34.8–46.6)
MCHC: 33.6 g/dL (ref 31.5–36.0)
MONO#: 0.7 10*3/uL (ref 0.1–0.9)
NEUT#: 4.9 10*3/uL (ref 1.5–6.5)
RBC: 4.35 10*6/uL (ref 3.70–5.45)
WBC: 8.3 10*3/uL (ref 3.9–10.3)
lymph#: 2.4 10*3/uL (ref 0.9–3.3)

## 2012-12-21 LAB — VITAMIN D 25 HYDROXY (VIT D DEFICIENCY, FRACTURES): Vit D, 25-Hydroxy: 56 ng/mL (ref 30–89)

## 2012-12-23 ENCOUNTER — Telehealth: Payer: Self-pay

## 2012-12-23 NOTE — Telephone Encounter (Signed)
Patient emailed stating that she was going to have to reschedule her surgery.  She said she was admitted last week to [facility] for cellulitis in her arm and had to take several days off and also, since has found out one of her co-workers is leaving.  Patient said she did not feel good about being out until latter August or early September.   Surgery rescheduled for August 25 7:30am and patient informed.

## 2012-12-26 ENCOUNTER — Ambulatory Visit: Payer: 59 | Admitting: Oncology

## 2012-12-31 ENCOUNTER — Telehealth: Payer: Self-pay | Admitting: Oncology

## 2012-12-31 ENCOUNTER — Ambulatory Visit (HOSPITAL_BASED_OUTPATIENT_CLINIC_OR_DEPARTMENT_OTHER): Payer: 59 | Admitting: Oncology

## 2012-12-31 VITALS — BP 112/70 | HR 91 | Temp 98.6°F | Resp 20 | Ht 67.0 in | Wt 242.0 lb

## 2012-12-31 DIAGNOSIS — C50911 Malignant neoplasm of unspecified site of right female breast: Secondary | ICD-10-CM

## 2012-12-31 DIAGNOSIS — C50919 Malignant neoplasm of unspecified site of unspecified female breast: Secondary | ICD-10-CM

## 2012-12-31 MED ORDER — TAMOXIFEN CITRATE 20 MG PO TABS: 20.0000 mg | ORAL_TABLET | Freq: Every day | ORAL | Status: DC

## 2012-12-31 NOTE — Progress Notes (Signed)
ID: [name] Gibson   DOB: Dec 22, 1955  MR#: [id number]  CSN#:[id number]  PCP: [name] Bradford, MD GYN: SU:  OTHER MD:   HISTORY OF PRESENT ILLNESS: She had her annual screening mammogram on 10-24-07.  This showed a possible area of asymmetry in the right breast and she returned on 10-29-07 for diagnostic mammography and right breast ultrasound.  In the lower inner right breast, there was an area of architectural distortion which by ultrasound measured 2.9 cm.  It was ill-defined and hypoechoic.  In addition, in a different quadrant, there was an ill-defined hypoechoic mass up to 8 mm.  Breast specific gamma imaging was obtained on the same day and indeed showed two areas of abnormal uptake as already described.  The one inferiorly measured 2.2 cm. and the one superiorly 7 mm. by breast specific gamma imaging.  The patient underwent ultrasound-guided biopsy of both lesions on 11-04-07.  The final pathology showed:  1. An invasive lobular carcinoma (E-cadherin negative). 2. An invasive ductal carcinoma with some evidence of ductal carcinoma in situ.   Her subsequent history is as detailed below  INTERVAL HISTORY: [name] Gibson returns today for followup of [name] Gibson's breast cancer. The interval history is  unremarkable. She and her family just returned from a [address] which she greatly enjoyed.   REVIEW OF SYSTEMS: She continues to have massive right upper extremity lymphedema and this was complicated by cellulitis recently. That is not taking care of, but she still can't put on the sleeve by herself and she is going to need a review of the "fllexitech" mustache mechanism, which "came apart" and she has not been able to put together. She agrees she would benefit from a review at our lymphedema clinic. Aside from this problem, she is short of breath, mildly, when walking up stairs, but not otherwise. She has some issues with psoriasis and still has hot flashes. She has no other symptoms she is aware of from  her tamoxifen. A detailed review of systems was otherwise negative.  PAST MEDICAL HISTORY: Past Medical History  Diagnosis Date  . 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 Ovarian cyst   . Cancer     Lobular breast cancer -Right  . Pituitary microadenoma   . Hypertension   . Swelling of arm   . Complication of anesthesia     some nausea after second mastectomy surgery  1. History of a possible mitral valve prolapse murmur (the patient currently does not take prophylactic antibiotics before dental procedures). 2. History of Fen-Phen use. 3. History of minimal urinary incontinence. Marland Kitchen    PAST SURGICAL HISTORY: Past Surgical History  Procedure Laterality Date  . Tonsillectomy    . Anal fistula repair    . Breast surgery      Right Mastectomy  . Cervical cone biopsy    . Cholecystectomy    . Colposcopy      FAMILY HISTORY Family History  Problem Relation Age of Onset  . Hypertension Mother   . Melanoma Mother   . Diabetes Father   . Hypertension Father   . Heart disease Father   . Parkinsonism Father   . Breast cancer Cousin 49    passed away form disease  . COPD Brother   . Breast cancer Cousin   The patient's father died at the age of 70 in the setting of Parkinson's disease and multiple other medical problems.  The patient's mother is alive at 20.  She has a history  of foot melanoma.  The patient has one brother and one sister.  There is no breast cancer or ovarian cancer in the immediate family.  In the patient's mother's father's family, however, two daughters of one sister have been diagnosed with cancer, one at the age of 34 who unfortunately succumbed and one at the age of 69.  So this would be two first cousins out of a large number of cousins (recall that the patient's father was one of 12 siblings).  GYNECOLOGIC HISTORY: She is GX P2, first pregnancy to term age 32. Last menstrual period started last week.  She has occasional hot flashes.  She tells  me [name] Gibson has tested her to see if she is menopausal but she had some trouble understanding those results and, in any case, she just had a period.    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  Her husband, [name] Gibson, is a Statistician but he is partially disabled, and he is currently doing some mediation.  They have a 24 year old daughter and a 75 year old son.   ADVANCED DIRECTIVES: not in place  HEALTH MAINTENANCE: History  Substance Use Topics  . Smoking status: Former Games developer  . Smokeless tobacco: Never Used  . Alcohol Use: No     Colonoscopy:  PAP: UTD/[place]  Bone density:  Lipid panel:  Allergies  Allergen Reactions  . Sulfa Antibiotics     Current Outpatient Prescriptions  Medication Sig Dispense Refill  . acetaminophen (TYLENOL) 500 MG tablet Take 500 mg by mouth every 6 (six) hours as needed for pain.      Marland Kitchen aspirin 81 MG tablet Take 81 mg by mouth daily.      . Calcium Carbonate-Vitamin D (CALCIUM + D PO) Take 1 tablet by mouth 2 (two) times daily.       Marland Kitchen doxycycline (VIBRAMYCIN) 50 MG capsule Take 2 capsules (100 mg total) by mouth 2 (two) times daily.  14 capsule  0  . escitalopram (LEXAPRO) 10 MG tablet Take 1 tablet (10 mg total) by mouth daily.  90 tablet  4  . hydrochlorothiazide (HYDRODIURIL) 25 MG tablet Take 25 mg by mouth daily.      Marland Kitchen LISINOPRIL PO Take 40 mg by mouth every morning.       Marland Kitchen oxyCODONE (OXY IR/ROXICODONE) 5 MG immediate release tablet Take 1 tablet (5 mg total) by mouth every 4 (four) hours as needed for pain.  30 tablet  0  . Potassium Chloride (KLOR-CON PO) Take 20 mEq by mouth.       . tamoxifen (NOLVADEX) 20 MG tablet TAKE 1 TABLET BY MOUTH DAILY  90 tablet  4  . zolpidem (AMBIEN) 5 MG tablet Take 5 mg by mouth at bedtime as needed.       No current facility-administered medications for this visit.    OBJECTIVE: Middle-aged white woman in no acute  distress Filed Vitals:   12/31/12 1501  BP: 112/70  Pulse: 91  Temp: 98.6 F (37 C)  Resp: 20     Body mass index is 37.89 kg/(m^2).    ECOG FS: 1  Sclerae unicteric Oropharynx clear No peripheral adenopathy Lungs no rales or rhonchi Heart regular rate and rhythm, no murmur appreciated Abd obese, benign MSK no focal spinal tenderness, significant lymph edema RUE, chronic, no erythema Neuro: nonfocal, well oriented, positive affect Breasts: Right breast is status post mastectomy; there are some telangiectasias laterally secondary to  radiation; the right axilla is benign. left breast is unremarkable  LAB RESULTS: Lab Results  Component Value Date   WBC 8.3 12/20/2012   NEUTROABS 4.9 12/20/2012   HGB 13.3 12/20/2012   HCT 39.6 12/20/2012   MCV 91.0 12/20/2012   PLT 302 12/20/2012      Chemistry      Component Value Date/Time   NA 140 12/20/2012 1054   NA 138 12/13/2012 0450   K 3.8 12/20/2012 1054   K 3.7 12/13/2012 0450   CL 103 12/20/2012 1054   CL 100 12/13/2012 0450   CO2 27 12/20/2012 1054   CO2 31 12/13/2012 0450   BUN 14.7 12/20/2012 1054   BUN 11 12/13/2012 0450   BUN 16 11/02/2010 1613   CREATININE 0.7 12/20/2012 1054   CREATININE 0.65 12/13/2012 0450   CREATININE 0.8 11/02/2010 1613      Component Value Date/Time   CALCIUM 9.0 12/20/2012 1054   CALCIUM 8.6 12/13/2012 0450   ALKPHOS 102 12/20/2012 1054   ALKPHOS 121* 12/12/2012 0558   AST 14 12/20/2012 1054   AST 56* 12/12/2012 0558   ALT 25 12/20/2012 1054   ALT 49* 12/12/2012 0558   BILITOT 0.31 12/20/2012 1054   BILITOT 0.5 12/12/2012 0558       Lab Results  Component Value Date   LABCA2 25 07/16/2012    No components found with this basename: [id number]    No results found for this basename: INR,  in the last 168 hours  Urinalysis    Component Value Date/Time   COLORURINE YELLOW 12/11/2012 0809   APPEARANCEUR CLEAR 12/11/2012 0809   LABSPEC 1.006 12/11/2012 0809   PHURINE 6.0 12/11/2012 0809   GLUCOSEU NEGATIVE  12/11/2012 0809   HGBUR NEGATIVE 12/11/2012 0809   BILIRUBINUR NEGATIVE 12/11/2012 0809   KETONESUR NEGATIVE 12/11/2012 0809   PROTEINUR NEGATIVE 12/11/2012 0809   UROBILINOGEN 0.2 12/11/2012 0809   NITRITE NEGATIVE 12/11/2012 0809   LEUKOCYTESUR NEGATIVE 12/11/2012 0809    STUDIES: Mammography at [facility] (left side only) 11/01/2012 was unremarkable   ASSESSMENT: 57 y.o.  [place] woman   (1) status post right modified radical mastectomy in May 2009 for a multifocal/multicentric breast carcinoma, the major component being lobular with a minor ductal component, pathologically T2 N1, Stage IIB, grade 2, estrogen and progesterone receptor positive, HER2 negative, with a low MIB-1,  (2) treated adjuvantly with dose-dense doxorubicin/ cyclophosphamide x4 followed by weekly paclitaxel x12, completed October 2009,   (3) radiation completed on December 2009 at which point she started tamoxifen.   PLAN: Hajra is doing great from the point of view of her breast cancer, with no evidence of disease activity now 5 years out from her original surgery. The plan is to continue tamoxifen for a total of 10 years, and we will see her once a year for the next 5 years.  I wish she did not have to deal with the lymphedema. She is doing her best, using the flexitech at night, and the compression sleeve during the day. We are requesting that she be reviewed again our lymphedema clinic to see if there are new techniques that she might try. Otherwise she is going to return to see [place] again in one year. She knows to call for any problems that may develop before that visit.  Lakeithia Rasor C    12/31/2012

## 2013-01-08 ENCOUNTER — Other Ambulatory Visit: Payer: Self-pay | Admitting: Oncology

## 2013-01-08 ENCOUNTER — Ambulatory Visit: Payer: 59 | Admitting: Gynecology

## 2013-03-03 ENCOUNTER — Ambulatory Visit (INDEPENDENT_AMBULATORY_CARE_PROVIDER_SITE_OTHER): Payer: 59 | Admitting: Gynecology

## 2013-03-03 ENCOUNTER — Encounter: Payer: Self-pay | Admitting: Gynecology

## 2013-03-03 VITALS — BP 124/74

## 2013-03-03 DIAGNOSIS — N951 Menopausal and female climacteric states: Secondary | ICD-10-CM

## 2013-03-03 DIAGNOSIS — R9389 Abnormal findings on diagnostic imaging of other specified body structures: Secondary | ICD-10-CM

## 2013-03-03 DIAGNOSIS — Z780 Asymptomatic menopausal state: Secondary | ICD-10-CM

## 2013-03-03 DIAGNOSIS — Z853 Personal history of malignant neoplasm of breast: Secondary | ICD-10-CM

## 2013-03-03 DIAGNOSIS — N83202 Unspecified ovarian cyst, left side: Secondary | ICD-10-CM

## 2013-03-03 DIAGNOSIS — N83209 Unspecified ovarian cyst, unspecified side: Secondary | ICD-10-CM

## 2013-03-03 NOTE — Progress Notes (Signed)
Patient presented to the office today to further discuss her planned up and coming total laparoscopic hysterectomy bilateral salpingo-oophorectomy for later this month. History as follows:  1.status post right modified radical mastectomy in May 2009 for a multifocal/multicentric breast carcinoma, the major component being lobular with a minor ductal component, pathologically T2 N1, Stage IIB, grade 2, estrogen and progesterone receptor positive, HER2 negative, with a low MIB-1,  2.treated adjuvantly with dose-dense doxorubicin/ cyclophosphamide x4 followed by weekly paclitaxel x12, completed October 2009,   3.radiation completed on December 2009 at which point she started tamoxifen  Ultrasound early this year demonstrated prominent endometrial cavity with endometrial stripe is 7.9 mm. At the time of recent ultrasound she was noted to have a normal right ovary but with a left ovarian remnant tissue seen with continued presence of a thin-walled echo-free cyst which was avascular and measured 4.3 x 3.4 x 4.7 cm there was no fluid in the cul-de-sac. Average size of cyst in 2013 was 4.5 cm essentially unchanged. Patient has also complained of tenderness in the left lower quadrant.  Endometrial biopsy done 11/01/2012: Endometrium, biopsy - ATROPHIC, FRAGMENTED ENDOMETRIAL GLANDS ADMIXED WITH BLOOD. NO ATYPIA OR MALIGNANCY.  Patient stated that she was tested for the BRCA1 and BRCA2 gene mutation and was negative.  Patient with past history of cervical cone biopsy many years ago for CIN-3 and subsequent Paps have been normal.  Assessment/plan: Patient with persistent left ovarian cyst appears to be benign in appearance. A CA 125 will BE drawn today it's limitations were discussed with the patient. She will return back next month for an ultrasound said she was to wait for her surgery until next year. I have explained to her that I cannot give her any guarantee as to any potential risk of endometrial  hyperplasia or endometrial cancer based on a [place] endometrial biopsy that was benign despite a thickened endometrium. She also had questions about her [facility] there were tested in March and was normal. She is not having any vasomotor symptoms at the age of 23. We'll repeat her [facility] today. She will let us know on Wednesday if she wants to proceed with her total laparoscopic hysterectomy with bilateral salpingo-oophorectomy. We discussed the risks benefits and pros and cons of the operation as well as potential risk of worsening vasomotor symptoms and the protective effects of the ovaries according to the new findings as to decrease the incidence of cardiovascular disease if Left until the age of 49.

## 2013-03-03 NOTE — Patient Instructions (Addendum)
CA-125 Tumor Marker CA 125 is a tumor marker that is used to help monitor the course of ovarian or endometrial cancer. PREPARATION FOR TEST No preparation is necessary. NORMAL FINDINGS Adults: 0-35 units/mL (0-35 kilounits)/L Ranges for normal findings may vary among different laboratories and hospitals. You should always check with your doctor after having lab work or other tests done to discuss the meaning of your test results and whether your values are considered within normal limits. MEANING OF TEST  Your caregiver will go over the test results with you and discuss the importance and meaning of your results, as well as treatment options and the need for additional tests if necessary. OBTAINING THE TEST RESULTS It is your responsibility to obtain your test results. Ask the lab or department performing the test when and how you will get your results. Document Released: 08/01/2004 Document Revised: 10/02/2011 Document Reviewed: 06/17/2008 [facility] Patient Information 2014 [place], [place].

## 2013-03-05 ENCOUNTER — Telehealth: Payer: Self-pay

## 2013-03-05 NOTE — Telephone Encounter (Signed)
When I called patient with CA125 results yesterday she expressed doubts about having surgery at this time. She was going to consider overnight. I received this email today.  "Thank you for your time yesterday to allow me to vent my concerns about surgery.  I have decided that I want to cancel the surgery at this time.  I have an appointment in September for an ultrasound to check my uterine lining and the ovarian cyst again.  If all is well, Dr Beatrix Shipper will repeat in April and go from there.   I am sorry for taking so long to figure out what to do, but I have not been at peace with the decision to have the surgery right now."  I will cancel her surgery and let the appointment desk know.

## 2013-03-12 ENCOUNTER — Institutional Professional Consult (permissible substitution): Payer: 59 | Admitting: Gynecology

## 2013-03-17 ENCOUNTER — Encounter (HOSPITAL_COMMUNITY): Admission: RE | Payer: Self-pay | Source: Ambulatory Visit

## 2013-03-17 ENCOUNTER — Ambulatory Visit (HOSPITAL_COMMUNITY): Admission: RE | Admit: 2013-03-17 | Payer: 59 | Source: Ambulatory Visit | Admitting: Gynecology

## 2013-03-17 SURGERY — HYSTERECTOMY, TOTAL, LAPAROSCOPIC
Anesthesia: General | Laterality: Bilateral

## 2013-04-10 ENCOUNTER — Ambulatory Visit: Payer: 59 | Admitting: Gynecology

## 2013-04-10 ENCOUNTER — Other Ambulatory Visit: Payer: 59

## 2013-05-29 ENCOUNTER — Other Ambulatory Visit: Payer: Self-pay

## 2013-08-21 ENCOUNTER — Inpatient Hospital Stay (HOSPITAL_COMMUNITY)
Admission: EM | Admit: 2013-08-21 | Discharge: 2013-08-25 | DRG: 603 | Disposition: A | Discharge status: Home / Self Care | Payer: 59 | Attending: Internal Medicine | Admitting: Internal Medicine

## 2013-08-21 ENCOUNTER — Encounter (HOSPITAL_COMMUNITY): Payer: Self-pay | Admitting: Emergency Medicine

## 2013-08-21 DIAGNOSIS — Z833 Family history of diabetes mellitus: Secondary | ICD-10-CM

## 2013-08-21 DIAGNOSIS — C50911 Malignant neoplasm of unspecified site of right female breast: Secondary | ICD-10-CM | POA: Diagnosis present

## 2013-08-21 DIAGNOSIS — K5900 Constipation, unspecified: Secondary | ICD-10-CM | POA: Diagnosis present

## 2013-08-21 DIAGNOSIS — Z8249 Family history of ischemic heart disease and other diseases of the circulatory system: Secondary | ICD-10-CM

## 2013-08-21 DIAGNOSIS — Z79810 Long term (current) use of selective estrogen receptor modulators (SERMs): Secondary | ICD-10-CM

## 2013-08-21 DIAGNOSIS — I10 Essential (primary) hypertension: Secondary | ICD-10-CM | POA: Diagnosis present

## 2013-08-21 DIAGNOSIS — Z923 Personal history of irradiation: Secondary | ICD-10-CM

## 2013-08-21 DIAGNOSIS — Z853 Personal history of malignant neoplasm of breast: Secondary | ICD-10-CM

## 2013-08-21 DIAGNOSIS — L03113 Cellulitis of right upper limb: Secondary | ICD-10-CM | POA: Diagnosis present

## 2013-08-21 DIAGNOSIS — Z808 Family history of malignant neoplasm of other organs or systems: Secondary | ICD-10-CM

## 2013-08-21 DIAGNOSIS — L408 Other psoriasis: Secondary | ICD-10-CM | POA: Diagnosis present

## 2013-08-21 DIAGNOSIS — I890 Lymphedema, not elsewhere classified: Secondary | ICD-10-CM | POA: Diagnosis present

## 2013-08-21 DIAGNOSIS — L409 Psoriasis, unspecified: Secondary | ICD-10-CM | POA: Diagnosis present

## 2013-08-21 DIAGNOSIS — R509 Fever, unspecified: Secondary | ICD-10-CM | POA: Diagnosis present

## 2013-08-21 DIAGNOSIS — C50919 Malignant neoplasm of unspecified site of unspecified female breast: Secondary | ICD-10-CM

## 2013-08-21 DIAGNOSIS — Z87891 Personal history of nicotine dependence: Secondary | ICD-10-CM

## 2013-08-21 DIAGNOSIS — Z9010 Acquired absence of unspecified breast and nipple: Secondary | ICD-10-CM

## 2013-08-21 DIAGNOSIS — M79609 Pain in unspecified limb: Secondary | ICD-10-CM | POA: Diagnosis present

## 2013-08-21 DIAGNOSIS — IMO0002 Reserved for concepts with insufficient information to code with codable children: Principal | ICD-10-CM | POA: Diagnosis present

## 2013-08-21 HISTORY — DX: Psoriasis, unspecified: L40.9

## 2013-08-21 HISTORY — DX: Cellulitis of right upper limb: L03.113

## 2013-08-21 LAB — CBC WITH DIFFERENTIAL/PLATELET
Basophils Absolute: 0 10*3/uL (ref 0.0–0.1)
Basophils Relative: 0 % (ref 0–1)
EOS PCT: 1 % (ref 0–5)
Eosinophils Absolute: 0.2 10*3/uL (ref 0.0–0.7)
HEMATOCRIT: 40.8 % (ref 36.0–46.0)
HEMOGLOBIN: 14.3 g/dL (ref 12.0–15.0)
LYMPHS ABS: 2.9 10*3/uL (ref 0.7–4.0)
LYMPHS PCT: 18 % (ref 12–46)
MCH: 32 pg (ref 26.0–34.0)
MCHC: 35 g/dL (ref 30.0–36.0)
MCV: 91.3 fL (ref 78.0–100.0)
MONO ABS: 1 10*3/uL (ref 0.1–1.0)
Monocytes Relative: 6 % (ref 3–12)
Neutro Abs: 12.5 10*3/uL — ABNORMAL HIGH (ref 1.7–7.7)
Neutrophils Relative %: 75 % (ref 43–77)
Platelets: 253 10*3/uL (ref 150–400)
RBC: 4.47 MIL/uL (ref 3.87–5.11)
RDW: 12.9 % (ref 11.5–15.5)
WBC: 16.6 10*3/uL — AB (ref 4.0–10.5)

## 2013-08-21 LAB — BASIC METABOLIC PANEL
BUN: 15 mg/dL (ref 6–23)
CALCIUM: 9.3 mg/dL (ref 8.4–10.5)
CO2: 28 meq/L (ref 19–32)
CREATININE: 0.7 mg/dL (ref 0.50–1.10)
Chloride: 98 mEq/L (ref 96–112)
GFR calc Af Amer: >90 mL/min (ref >90)
GFR calc non Af Amer: >90 mL/min (ref >90)
GLUCOSE: 131 mg/dL — AB (ref 70–99)
Potassium: 4 mEq/L (ref 3.7–5.3)
SODIUM: 139 meq/L (ref 137–147)

## 2013-08-21 MED ORDER — SODIUM CHLORIDE 0.9 % IV BOLUS (SEPSIS)
1000.0000 mL | Freq: Once | INTRAVENOUS | Status: AC
  Administered 2013-08-21: 1000 mL via INTRAVENOUS

## 2013-08-21 MED ORDER — CLINDAMYCIN PHOSPHATE 600 MG/50ML IV SOLN
600.0000 mg | Freq: Once | INTRAVENOUS | Status: AC
  Administered 2013-08-21: 600 mg via INTRAVENOUS
  Filled 2013-08-21 (×2): qty 50

## 2013-08-21 MED ORDER — ACETAMINOPHEN 325 MG PO TABS
650.0000 mg | ORAL_TABLET | Freq: Once | ORAL | Status: AC
  Administered 2013-08-21: 650 mg via ORAL
  Filled 2013-08-21: qty 2

## 2013-08-21 NOTE — ED Provider Notes (Signed)
CSN: [id number]     Arrival date & time 08/21/13  2026 History   First MD Initiated Contact with Patient 08/21/13 2050     Chief Complaint  Patient presents with  . Arm Swelling   (Consider location/radiation/quality/duration/timing/severity/associated sxs/prior Treatment) Patient is a 58 y.o. female presenting with rash. The history is provided by the patient. No language interpreter was used.  Rash Location:  Shoulder/arm Shoulder/arm rash location:  R upper arm Quality: painful, redness and swelling   Pain details:    Quality:  Hot and burning   Severity:  Moderate   Duration:  1 day   Timing:  Constant   Progression:  Worsening Severity:  Moderate Duration:  1 day Timing:  Constant Progression:  Worsening Chronicity:  Recurrent Relieved by:  Nothing Worsened by:  Nothing tried Ineffective treatments:  None tried Associated symptoms: no abdominal pain, no diarrhea, no fatigue, no fever, no headaches, no joint pain, no nausea, no shortness of breath, no sore throat, no URI and not vomiting     Past Medical History  Diagnosis Date  . 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 Ovarian cyst   . Cancer     Lobular breast cancer -Right  . Pituitary microadenoma   . Hypertension   . Swelling of arm   . Complication of anesthesia     some nausea after second mastectomy surgery   Past Surgical History  Procedure Laterality Date  . Tonsillectomy    . Anal fistula repair    . Breast surgery      Right Mastectomy  . Cervical cone biopsy    . Cholecystectomy    . Colposcopy     Family History  Problem Relation Age of Onset  . Hypertension Mother   . Melanoma Mother   . Diabetes Father   . Hypertension Father   . Heart disease Father   . Parkinsonism Father   . Breast cancer Cousin 37    passed away form disease  . COPD Brother   . Breast cancer Cousin    History  Substance Use Topics  . Smoking status: Former Research scientist (life sciences)  . Smokeless tobacco: Never  Used  . Alcohol Use: No   OB History   Grav Para Term Preterm Abortions TAB SAB Ect Mult Living   2 2 2       2      Review of Systems  Constitutional: Negative for fever, chills, diaphoresis, activity change, appetite change and fatigue.  HENT: Negative for congestion, facial swelling, rhinorrhea and sore throat.   Eyes: Negative for photophobia and discharge.  Respiratory: Negative for cough, chest tightness and shortness of breath.   Cardiovascular: Negative for chest pain, palpitations and leg swelling.  Gastrointestinal: Negative for nausea, vomiting, abdominal pain and diarrhea.  Endocrine: Negative for polydipsia and polyuria.  Genitourinary: Negative for dysuria, frequency, difficulty urinating and pelvic pain.  Musculoskeletal: Negative for arthralgias, back pain, neck pain and neck stiffness.  Skin: Positive for rash. Negative for color change and wound.  Allergic/Immunologic: Negative for immunocompromised state.  Neurological: Negative for facial asymmetry, weakness, numbness and headaches.  Hematological: Does not bruise/bleed easily.  Psychiatric/Behavioral: Negative for confusion and agitation.    Allergies  Sulfa antibiotics  Home Medications   Current Outpatient Rx  Name  Route  Sig  Dispense  Refill  . acetaminophen (TYLENOL) 500 MG tablet   Oral   Take 500 mg by mouth every 6 (six) hours as needed for pain.         Marland Kitchen  Calcium Carbonate-Vitamin D (CALCIUM + D PO)   Oral   Take 1 tablet by mouth 2 (two) times daily.          Marland Kitchen escitalopram (LEXAPRO) 10 MG tablet   Oral   Take 1 tablet (10 mg total) by mouth daily.   90 tablet   4   . hydrochlorothiazide (HYDRODIURIL) 25 MG tablet   Oral   Take 25 mg by mouth daily.         Marland Kitchen lisinopril (PRINIVIL,ZESTRIL) 40 MG tablet   Oral   Take 40 mg by mouth daily.         . potassium chloride SA (K-DUR,KLOR-CON) 20 MEQ tablet   Oral   Take 20 mEq by mouth daily.         . tamoxifen (NOLVADEX) 20  MG tablet   Oral   Take 20 mg by mouth daily.         Marland Kitchen zolpidem (AMBIEN) 5 MG tablet   Oral   Take 5 mg by mouth at bedtime as needed.          BP 140/62  Pulse 110  Temp(Src) 98.8 F (37.1 C) (Oral)  Resp 18  Ht 5' 6.5" (1.689 m)  Wt 253 lb 9 oz (115.015 kg)  BMI 40.32 kg/m2  SpO2 100% Physical Exam  Constitutional: She is oriented to person, place, and time. She appears well-developed and well-nourished. No distress.  HENT:  Head: Normocephalic and atraumatic.  Mouth/Throat: No oropharyngeal exudate.  Eyes: Pupils are equal, round, and reactive to light.  Neck: Normal range of motion. Neck supple.  Cardiovascular: Normal rate, regular rhythm and normal heart sounds.  Exam reveals no gallop and no friction rub.   No murmur heard. Pulmonary/Chest: Effort normal and breath sounds normal. No respiratory distress. She has no wheezes. She has no rales.  Abdominal: Soft. Bowel sounds are normal. She exhibits no distension and no mass. There is no tenderness. There is no rebound and no guarding.  Musculoskeletal: Normal range of motion. She exhibits no edema and no tenderness.       Arms: Lymphedema RUE.  Erythema as shown with warmth.    Neurological: She is alert and oriented to person, place, and time.  Skin: Skin is warm and dry.  Psychiatric: She has a normal mood and affect.    ED Course  Procedures (including critical care time) Labs Review Labs Reviewed  CBC WITH DIFFERENTIAL - Abnormal; Notable for the following:    WBC 16.6 (*)    Neutro Abs 12.5 (*)    All other components within normal limits  BASIC METABOLIC PANEL - Abnormal; Notable for the following:    Glucose, Bld 131 (*)    All other components within normal limits   Imaging Review No results found.  EKG Interpretation   None       MDM   1. Right arm cellulitis    Pt is a 58 y.o. female with Pmhx as above who presents with 1 day of increased redness, swelling, warmth & burning/stinging  pain of RUE since 5pm.  She has hx of cellulitis in same location and has chronic lymphedema or arm since mastectomy.  No fever, chills, n/v, d/a during initial interview.  She is well-appearing, in NAD, afebrile on PE.  She has well circumscribed erythema of RUE which was marked during first exam around 2100 and had increased by several centimeters at time of second exam at 2240 and had then developed chills.  Skin remarked.  Suspect cellulitis. Pt given IV clinda. She is tachycardic 105-110, but BP stable.  [facility] consulted for admission for cellulitis.         Neta Ehlers, MD 08/21/13 2300

## 2013-08-21 NOTE — ED Notes (Signed)
MD at bedside. 

## 2013-08-21 NOTE — ED Notes (Signed)
Pt states she has lymphedema in her right arm  Pt states it is more swollen than normal and she has a stinging pain in it  Pt states she has a hx of cellulitis in it and had to be admitted last time

## 2013-08-21 NOTE — H&P (Signed)
[facility] History and Physical  [name] Gibson MLY:[id number] DOB: 10/04/1955 DOA: 08/21/2013  Referring physician:  PCP: Vidal Schwalbe, MD   Chief Complaint: Right arm pain and erythema  HPI: [name] Gibson is a 58 y.o. female with a past medical history of multifocal/multicentric breast cancer, stage IIB, grade 2 estrogen and progesterone receptor positive, status post right modified radical mastectomy in May of 2009, with chronic lymphedema to right upper extremity following mastectomy, who presents to the emergency department with complaints of right arm pain and erythema. She states that symptoms started early this afternoon, noting pain to proximal region of her right lower extremity. She reported noting erythema along the medial aspect of her right upper arm, becoming progressively worse over the course of the evening. She complains of associated subjective fevers and chills. Initial lab work in the emergency room showed a white count of 16,600. On reassessment by emergency room staff it appeared that erythema had progressed by several centimeters.                                                                                                    Review of Systems:  Constitutional:  No weight loss, night sweats, positive for subjective fevers and chills.  HEENT:  No headaches, Difficulty swallowing,Tooth/dental problems,Sore throat,  No sneezing, itching, ear ache, nasal congestion, post nasal drip,  Cardio-vascular:  No chest pain, Orthopnea, PND, swelling in lower extremities, anasarca, dizziness, palpitations  GI:  No heartburn, indigestion, abdominal pain, nausea, vomiting, diarrhea, change in bowel habits, loss of appetite  Resp:  No shortness of breath with exertion or at rest. No excess mucus, no productive cough, No non-productive cough, No coughing up of blood.No change in color of mucus.No wheezing.No chest wall deformity  Skin:  Positive for right upper extremity  erythema and pain  GU:  no dysuria, change in color of urine, no urgency or frequency. No flank pain.  Musculoskeletal:  No joint pain or swelling. No decreased range of motion. No back pain.  Psych:  No change in mood or affect. No depression or anxiety. No memory loss.   Past Medical History  Diagnosis Date  . 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 Ovarian cyst   . Cancer     Lobular breast cancer -Right  . Pituitary microadenoma   . Hypertension   . Swelling of arm   . Complication of anesthesia     some nausea after second mastectomy surgery   Past Surgical History  Procedure Laterality Date  . Tonsillectomy    . Anal fistula repair    . Breast surgery      Right Mastectomy  . Cervical cone biopsy    . Cholecystectomy    . Colposcopy     Social History:  reports that she has quit smoking. She has never used smokeless tobacco. She reports that she does not drink alcohol or use illicit drugs.  Allergies  Allergen Reactions  . Sulfa Antibiotics Hives    Family History  Problem Relation Age of Onset  . Hypertension  Mother   . Melanoma Mother   . Diabetes Father   . Hypertension Father   . Heart disease Father   . Parkinsonism Father   . Breast cancer Cousin 64    passed away form disease  . COPD Brother   . Breast cancer Cousin      Prior to Admission medications   Medication Sig Start Date End Date Taking? Authorizing Provider  acetaminophen (TYLENOL) 500 MG tablet Take 500 mg by mouth every 6 (six) hours as needed for pain.   Yes Historical Provider, MD  Calcium Carbonate-Vitamin D (CALCIUM + D PO) Take 1 tablet by mouth 2 (two) times daily.    Yes Historical Provider, MD  escitalopram (LEXAPRO) 10 MG tablet Take 1 tablet (10 mg total) by mouth daily. 09/25/12  Yes Huel Cote, NP  hydrochlorothiazide (HYDRODIURIL) 25 MG tablet Take 25 mg by mouth daily.   Yes Historical Provider, MD  lisinopril (PRINIVIL,ZESTRIL) 40 MG tablet Take 40 mg by  mouth daily.   Yes Historical Provider, MD  potassium chloride SA (K-DUR,KLOR-CON) 20 MEQ tablet Take 20 mEq by mouth daily.   Yes Historical Provider, MD  tamoxifen (NOLVADEX) 20 MG tablet Take 20 mg by mouth daily.   Yes Historical Provider, MD  zolpidem (AMBIEN) 5 MG tablet Take 5 mg by mouth at bedtime as needed.   Yes Historical Provider, MD   Physical Exam: Filed Vitals:   08/21/13 2229  BP: 140/62  Pulse: 110  Temp:   Resp: 18    BP 140/62  Pulse 110  Temp(Src) 98.8 F (37.1 C) (Oral)  Resp 18  Ht 5' 6.5" (1.689 m)  Wt 115.015 kg (253 lb 9 oz)  BMI 40.32 kg/m2  SpO2 100%  General:  Appears calm and comfortable Eyes: PERRL, normal lids, irises & conjunctiva ENT: grossly normal hearing, lips & tongue Neck: no LAD, masses or thyromegaly Cardiovascular: RRR, has 2/6 systolic ejection murmur, no rubs or gallops Telemetry: SR, tachycardic Respiratory: CTA bilaterally, no w/r/r. Normal respiratory effort. Abdomen: soft, ntnd Skin: Erythema noted along the proximal region of right upper extremity, circumferential, with associated localized warmth and pain to palpation. Musculoskeletal: Patient having right upper extremity swelling extending from wrist up to her shoulder. Psychiatric: grossly normal mood and affect, speech fluent and appropriate Neurologic: grossly non-focal.          Labs on Admission:  Basic Metabolic Panel:  Recent Labs Lab 08/21/13 2125  NA 139  K 4.0  CL 98  CO2 28  GLUCOSE 131*  BUN 15  CREATININE 0.70  CALCIUM 9.3   Liver Function Tests: No results found for this basename: AST, ALT, ALKPHOS, BILITOT, PROT, ALBUMIN,  in the last 168 hours No results found for this basename: LIPASE, AMYLASE,  in the last 168 hours No results found for this basename: AMMONIA,  in the last 168 hours CBC:  Recent Labs Lab 08/21/13 2125  WBC 16.6*  NEUTROABS 12.5*  HGB 14.3  HCT 40.8  MCV 91.3  PLT 253   Cardiac Enzymes: No results found for this  basename: CKTOTAL, CKMB, CKMBINDEX, TROPONINI,  in the last 168 hours  BNP (last 3 results) No results found for this basename: PROBNP,  in the last 8760 hours CBG: No results found for this basename: GLUCAP,  in the last 168 hours  Radiological Exams on Admission: No results found.  EKG: Independently reviewed.   Assessment/Plan Active Problems:   Cellulitis of arm, right   Chronic acquired lymphedema  Hypertension   Breast cancer   Cellulitis   1. Right arm cellulitis. Patient with history of chronic lymphedema to her right upper extremity following right mastectomy in 2009. Labs show an elevated white count in the 16,000 range. She complains of associated subjective fevers and chills. Symptoms appear to be most consistent with cellulitis, will start empiric IV antibiotic therapy with vancomycin, with pharmacy consultation for dosing. Will obtain a set up of blood cultures, admit to medicine, provide supportive care. 2. Stage IIB breast cancer, status post modified right radical mastectomy in May of 2009. She remains on tamoxifen therapy, which will be continued during this hospitalization. 3. Hypertension. Initial blood pressure is elevated which could be secondary to pain resulting from right upper extremity cellulitis. We will continue current regimen with lisinopril 40 mg by mouth daily. 4. Leukocytosis. Patient presenting with a white count of 16,600 likely secondary to cellulitis 5. DVT prophylaxis. Lovenox   Code Status: Full code Family Communication: I spoke with patient's husband present at bedside Disposition Plan: I anticipate patient will require at least 2 nights hospitalization to receive IV antibiotic therapy   Time spent: [place] Pager [phone]

## 2013-08-22 ENCOUNTER — Encounter (HOSPITAL_COMMUNITY): Payer: Self-pay | Admitting: Internal Medicine

## 2013-08-22 LAB — BASIC METABOLIC PANEL
BUN: 16 mg/dL (ref 6–23)
CO2: 26 meq/L (ref 19–32)
CREATININE: 0.69 mg/dL (ref 0.50–1.10)
Calcium: 8.9 mg/dL (ref 8.4–10.5)
Chloride: 99 mEq/L (ref 96–112)
GFR calc Af Amer: >90 mL/min (ref >90)
Glucose, Bld: 117 mg/dL — ABNORMAL HIGH (ref 70–99)
Potassium: 4 mEq/L (ref 3.7–5.3)
Sodium: 138 mEq/L (ref 137–147)

## 2013-08-22 LAB — CBC
HCT: 38.6 % (ref 36.0–46.0)
Hemoglobin: 13.5 g/dL (ref 12.0–15.0)
MCH: 31.9 pg (ref 26.0–34.0)
MCHC: 35 g/dL (ref 30.0–36.0)
MCV: 91.3 fL (ref 78.0–100.0)
PLATELETS: ADEQUATE 10*3/uL (ref 150–400)
RBC: 4.23 MIL/uL (ref 3.87–5.11)
RDW: 12.9 % (ref 11.5–15.5)
WBC: 16.5 10*3/uL — AB (ref 4.0–10.5)

## 2013-08-22 LAB — MRSA PCR SCREENING: MRSA by PCR: NEGATIVE

## 2013-08-22 LAB — LACTIC ACID, PLASMA: LACTIC ACID, VENOUS: 2.4 mmol/L — AB (ref 0.5–2.2)

## 2013-08-22 MED ORDER — SODIUM CHLORIDE 0.9 % IV SOLN
INTRAVENOUS | Status: DC
  Administered 2013-08-22: via INTRAVENOUS
  Administered 2013-08-23: 20 mL/h via INTRAVENOUS

## 2013-08-22 MED ORDER — ACETAMINOPHEN 650 MG RE SUPP: 650.0000 mg | Freq: Four times a day (QID) | RECTAL | Status: DC | PRN

## 2013-08-22 MED ORDER — HYDROCHLOROTHIAZIDE 25 MG PO TABS
25.0000 mg | ORAL_TABLET | Freq: Every day | ORAL | Status: DC
  Administered 2013-08-22 – 2013-08-25 (×4): 25 mg via ORAL
  Filled 2013-08-22 (×4): qty 1

## 2013-08-22 MED ORDER — ACETAMINOPHEN 325 MG PO TABS
650.0000 mg | ORAL_TABLET | Freq: Four times a day (QID) | ORAL | Status: DC | PRN
  Administered 2013-08-22 – 2013-08-24 (×8): 650 mg via ORAL
  Filled 2013-08-22 (×8): qty 2

## 2013-08-22 MED ORDER — ENOXAPARIN SODIUM 60 MG/0.6ML ~~LOC~~ SOLN
60.0000 mg | Freq: Every day | SUBCUTANEOUS | Status: DC
  Administered 2013-08-22 – 2013-08-24 (×3): 60 mg via SUBCUTANEOUS
  Filled 2013-08-22 (×4): qty 0.6

## 2013-08-22 MED ORDER — PIPERACILLIN-TAZOBACTAM 3.375 G IVPB
3.3750 g | Freq: Three times a day (TID) | INTRAVENOUS | Status: DC
  Administered 2013-08-22 – 2013-08-24 (×7): 3.375 g via INTRAVENOUS
  Filled 2013-08-22 (×8): qty 50

## 2013-08-22 MED ORDER — ESCITALOPRAM OXALATE 10 MG PO TABS: 10.0000 mg | ORAL_TABLET | Freq: Every day | ORAL | Status: DC

## 2013-08-22 MED ORDER — ESCITALOPRAM OXALATE 10 MG PO TABS
10.0000 mg | ORAL_TABLET | Freq: Every day | ORAL | Status: DC
  Administered 2013-08-22 – 2013-08-24 (×4): 10 mg via ORAL
  Filled 2013-08-22 (×5): qty 1

## 2013-08-22 MED ORDER — LISINOPRIL 40 MG PO TABS
40.0000 mg | ORAL_TABLET | Freq: Every day | ORAL | Status: DC
  Administered 2013-08-22 – 2013-08-24 (×4): 40 mg via ORAL
  Filled 2013-08-22 (×5): qty 1

## 2013-08-22 MED ORDER — TAMOXIFEN CITRATE 10 MG PO TABS
10.0000 mg | ORAL_TABLET | Freq: Two times a day (BID) | ORAL | Status: DC
  Administered 2013-08-22 – 2013-08-25 (×8): 10 mg via ORAL
  Filled 2013-08-22 (×9): qty 1

## 2013-08-22 MED ORDER — SODIUM CHLORIDE 0.9 % IV SOLN
2500.0000 mg | Freq: Once | INTRAVENOUS | Status: AC
  Administered 2013-08-22: 2500 mg via INTRAVENOUS
  Filled 2013-08-22: qty 2500

## 2013-08-22 MED ORDER — ENOXAPARIN SODIUM 40 MG/0.4ML ~~LOC~~ SOLN: 40.0000 mg | SUBCUTANEOUS | Status: DC

## 2013-08-22 MED ORDER — ONDANSETRON HCL 4 MG PO TABS: 4.0000 mg | ORAL_TABLET | Freq: Four times a day (QID) | ORAL | Status: DC | PRN

## 2013-08-22 MED ORDER — SORBITOL 70 % SOLN: 30.0000 mL | Freq: Every day | Status: DC | PRN

## 2013-08-22 MED ORDER — TAMOXIFEN CITRATE 10 MG PO TABS: 10.0000 mg | ORAL_TABLET | Freq: Two times a day (BID) | ORAL | Status: DC

## 2013-08-22 MED ORDER — ALUM & MAG HYDROXIDE-SIMETH 200-200-20 MG/5ML PO SUSP: 30.0000 mL | Freq: Four times a day (QID) | ORAL | Status: DC | PRN

## 2013-08-22 MED ORDER — LISINOPRIL 40 MG PO TABS: 40.0000 mg | ORAL_TABLET | Freq: Every day | ORAL | Status: DC

## 2013-08-22 MED ORDER — ONDANSETRON HCL 4 MG/2ML IJ SOLN
4.0000 mg | Freq: Four times a day (QID) | INTRAMUSCULAR | Status: DC | PRN
  Administered 2013-08-22: 4 mg via INTRAVENOUS
  Filled 2013-08-22: qty 2

## 2013-08-22 MED ORDER — OXYCODONE HCL 5 MG PO TABS
5.0000 mg | ORAL_TABLET | ORAL | Status: DC | PRN
  Administered 2013-08-22 – 2013-08-25 (×10): 5 mg via ORAL
  Filled 2013-08-22 (×10): qty 1

## 2013-08-22 MED ORDER — ZOLPIDEM TARTRATE 5 MG PO TABS
5.0000 mg | ORAL_TABLET | Freq: Every evening | ORAL | Status: DC | PRN
  Administered 2013-08-22 – 2013-08-24 (×4): 5 mg via ORAL
  Filled 2013-08-22 (×4): qty 1

## 2013-08-22 MED ORDER — VANCOMYCIN HCL IN DEXTROSE 1-5 GM/200ML-% IV SOLN
1000.0000 mg | Freq: Two times a day (BID) | INTRAVENOUS | Status: DC
  Administered 2013-08-22 – 2013-08-24 (×5): 1000 mg via INTRAVENOUS
  Filled 2013-08-22 (×6): qty 200

## 2013-08-22 MED ORDER — MORPHINE SULFATE 2 MG/ML IJ SOLN: 2.0000 mg | INTRAMUSCULAR | Status: DC | PRN

## 2013-08-22 NOTE — Progress Notes (Signed)
ANTIBIOTIC CONSULT NOTE - INITIAL  Pharmacy Consult for IV Vancomycin Indication: RUE cellulitis  Allergies  Allergen Reactions  . Sulfa Antibiotics Hives    Patient Measurements: Height: 5' 6.5" (168.9 cm) Weight: 254 lb 3.2 oz (115.304 kg) IBW/kg (Calculated) : 60.45   Vital Signs: Temp: 101.8 F (38.8 C) (01/30 0000) Temp src: Oral (01/30 0000) BP: 147/93 mmHg (01/30 0000) Pulse Rate: 116 (01/30 0000) Intake/Output from previous day:   Intake/Output from this shift:    Labs:  Recent Labs  08/21/13 2125  WBC 16.6*  HGB 14.3  PLT 253  CREATININE 0.70   Estimated Creatinine Clearance: 103.4 ml/min (by C-G formula based on Cr of 0.7). No results found for this basename: VANCOTROUGH, VANCOPEAK, VANCORANDOM, GENTTROUGH, GENTPEAK, GENTRANDOM, TOBRATROUGH, TOBRAPEAK, TOBRARND, AMIKACINPEAK, AMIKACINTROU, AMIKACIN,  in the last 72 hours   Microbiology: No results found for this or any previous visit (from the past 720 hour(s)).  Medical History: Past Medical History  Diagnosis Date  . 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 Ovarian cyst   . Cancer     Lobular breast cancer -Right  . Pituitary microadenoma   . Hypertension   . Swelling of arm   . Complication of anesthesia     some nausea after second mastectomy surgery    Medications:  Scheduled:  . enoxaparin (LOVENOX) injection  60 mg Subcutaneous Daily  . escitalopram  10 mg Oral Daily  . lisinopril  40 mg Oral Daily  . tamoxifen  10 mg Oral BID  . vancomycin  2,500 mg Intravenous Once  . vancomycin  1,000 mg Intravenous Q12H   Infusions:  . sodium chloride 75 mL/hr at 08/22/13 0022   Assessment: 58 yo with hx breast Ca, now c/o right arm pain/erythema. Pt has chronic lymphedema to her RUE s/p mastectomy in '09.  Vancomycin for RUE cellulitis.  Goal of Therapy:  Vancomycin trough level 10-15 mcg/ml  Plan:   Vancomycin 2500mg  x1  Then 1 Gm IV q12h  F/U SCr/levels/cultures as  needed  [name] Gibson R 08/22/2013,12:31 AM

## 2013-08-22 NOTE — Care Management Note (Signed)
   CARE MANAGEMENT NOTE 08/22/2013  Patient:  [name] Gibson, [name] Gibson   Account Number:  [id number]  Date Initiated:  08/22/2013  Documentation initiated by:  Rawleigh Rode  Subjective/Objective Assessment:   58 yo female admitted with cellulitis.Hx of multifocal/multicentric breast cancer, stage IIB, grade 2 estrogen and progesterone receptor positive.     Action/Plan:   Home when stable   Anticipated DC Date:     Anticipated DC Plan:  [place]  CM consult      Choice offered to / List presented to:  NA   DME arranged  NA      DME agency  NA     [place] arranged  NA      [place] agency  NA   Status of service:  In process, will continue to follow Medicare Important Message given?   (If response is "NO", the following Medicare IM given date fields will be blank) Date Medicare IM given:   Date Additional Medicare IM given:    Discharge Disposition:    Per UR Regulation:  Reviewed for med. necessity/level of care/duration of stay  If discussed at [place] of Stay Meetings, dates discussed:    Comments:  08/22/13 1143 Stpehanie Montroy,MSN,RN [phone] Chart reviewed for utilization of services. No needs idnetified at this time.PCP: Vidal Schwalbe, MD

## 2013-08-22 NOTE — Progress Notes (Signed)
[facility] PROGRESS NOTE   [name] Gibson ZOX:[id number] DOB: 04-Apr-1956 DOA: 08/21/2013 PCP: Vidal Schwalbe, MD  Brief narrative: [name] Gibson is an 58 y.o. female with a PMH of multifocal/multicentric breast cancer, stage IIB, grade 2 estrogen and progesterone receptor positive, status post right modified radical mastectomy in May of 2009, with chronic lymphedema to right upper extremity following mastectomy who was admitted on 08/21/13 with right upper extremity cellulitis. Upon initial evaluation in the ED, the patient was found to have a white blood cell count of 16.6 and evidence of worsening cellulitis.  Assessment/Plan: Principal Problem:   Cellulitis of arm, right Patient has cellulitis in the setting of chronic lymphedema. She was placed on empiric vancomycin. Followup blood cultures. Febrile overnight. Active Problems:   Hypertension Continue lisinopril and HCTZ.   Breast cancer Continue tamoxifen. Status post adjuvant chemotherapy completed October 2009. Status post radiation therapy completed December 2009. Under the care of Dr. Jana Hakim.   Chronic acquired lymphedema Secondary to history of breast cancer with mastectomy. Uses a flexitech and compression sleeve to reduce swelling.   DVT prophylaxis Continue Lovenox.  Code Status: Full. Family Communication: Husband updated at bedside. Disposition Plan: Home when stable.   IV access:  Peripheral IV  Medical Consultants:  None  Other Consultants:  None  Anti-infectives:  Clindamycin 08/21/13---> 08/21/13  Vancomycin 08/22/13--->  HPI/Subjective: [name] Gibson has a bit of pain and soreness in her right arm.  She says it stays swollen, but it is a bit more swollen than usual, as well as "red" and "hot".  No nausea, vomiting or diarrhea.  Objective: Filed Vitals:   08/21/13 2323 08/22/13 0000 08/22/13 0200 08/22/13 0557  BP: 173/74 147/93  117/58  Pulse: 110 116  122  Temp: 99.4 F (37.4 C)  101.8 F (38.8 C) 99.4 F (37.4 C) 100.8 F (38.2 C)  TempSrc: Oral Oral Oral Oral  Resp: 20 19  20   Height:  5' 6.5" (1.689 m)    Weight:  115.304 kg (254 lb 3.2 oz)    SpO2: 97% 98%  97%    Intake/Output Summary (Last 24 hours) at 08/22/13 8119 Last data filed at 08/22/13 0557  Gross per 24 hour  Intake    300 ml  Output    800 ml  Net   -500 ml    Exam: Gen:  NAD Cardiovascular:  RRR, No M/R/G Respiratory:  Lungs CTAB Gastrointestinal:  Abdomen soft, NT/ND, + BS Extremities:  Massive RUE lymphedema, with erythema affecting entire extremity except for small area of antecubital space.  Data Reviewed: Basic Metabolic Panel:  Recent Labs Lab 08/21/13 2125 08/22/13 0100  NA 139 138  K 4.0 4.0  CL 98 99  CO2 28 26  GLUCOSE 131* 117*  BUN 15 16  CREATININE 0.70 0.69  CALCIUM 9.3 8.9   GFR Estimated Creatinine Clearance: 103.4 ml/min (by C-G formula based on Cr of 0.69).  CBC:  Recent Labs Lab 08/21/13 2125 08/22/13 0100  WBC 16.6* 16.5*  NEUTROABS 12.5*  --   HGB 14.3 13.5  HCT 40.8 38.6  MCV 91.3 91.3  PLT 253 PLATELET CLUMPS NOTED ON SMEAR, COUNT APPEARS ADEQUATE   Microbiology No results found for this or any previous visit (from the past 240 hour(s)).   Procedures and Diagnostic Studies: No results found.  Scheduled Meds: . enoxaparin (LOVENOX) injection  60 mg Subcutaneous Daily  . escitalopram  10 mg Oral QHS  . lisinopril  40 mg Oral QHS  .  tamoxifen  10 mg Oral BID  . vancomycin  1,000 mg Intravenous Q12H   Continuous Infusions: . sodium chloride 75 mL/hr at 08/22/13 0022    Time spent: 35 minutes with > 50% of time discussing current diagnostic test results, clinical impression and plan of care with the patient and her husband.    LOS: 1 day   Adelita Hone  [facility] Pager [phone].   *Please note that the hospitalists switch teams on Wednesdays. Please call the flow manager at [phone] if you are having difficulty  reaching the hospitalist taking care of this patient as she can update you and provide the most up-to-date pager number of provider caring for the patient. If 8PM-8AM, please contact night-coverage at [email or web address], password [facility]  08/22/2013, 7:38 AM   Information printed out, explained, and given to the patient:  In an effort to keep you and your family informed about your hospital stay, I am providing you with this information sheet. If you or your family have any questions, please do not hesitate to have the nursing staff page me to set up a meeting time.  [name] Gibson 08/22/2013 1 (Number of days in the hospital)  Treatment team:  Dr. Jacquelynn Cree, Hospitalist (Internist)   Active Treatment Issues with Plan: Principal Problem:   Skin infection (cellulitis) of right arm You are being treated with vancomycin and Zosyn (antibiotics). Blood cultures were obtained.  Results can take up to 3 days to come back. Active Problems:   High blood pressure Continue lisinopril and HCTZ.   History of breast cancer Continue tamoxifen. You completed chemotherapy October 2009. You completed radiation therapy December 2009. Under the care of Dr. Jana Hakim.   Chronic swelling of the arm (lymphedema) Secondary to history of breast cancer with mastectomy. Use a flexitech and compression sleeve to reduce swelling.   Blood clot prevention Continue Lovenox.  Anticipated discharge date: 1-3 days depending on progress.

## 2013-08-23 ENCOUNTER — Encounter (HOSPITAL_COMMUNITY): Payer: Self-pay | Admitting: Internal Medicine

## 2013-08-23 DIAGNOSIS — L409 Psoriasis, unspecified: Secondary | ICD-10-CM | POA: Diagnosis present

## 2013-08-23 NOTE — Progress Notes (Signed)
[facility] PROGRESS NOTE   [name] Gibson IOE:[id number] DOB: Apr 27, 1956 DOA: 08/21/2013 PCP: Vidal Schwalbe, MD  Brief narrative: [name] Gibson is an 58 y.o. female with a PMH of multifocal/multicentric breast cancer, stage IIB, grade 2 estrogen and progesterone receptor positive, status post right modified radical mastectomy in May of 2009, with chronic lymphedema to right upper extremity following mastectomy who was admitted on 08/21/13 with right upper extremity cellulitis. Upon initial evaluation in the ED, the patient was found to have a white blood cell count of 16.6 and evidence of worsening cellulitis.  Assessment/Plan: Principal Problem:   Cellulitis of arm, right Patient has cellulitis in the setting of chronic lymphedema. She was placed on empiric vancomycin on admission and Zosyn was added 08/22/13 secondary to worsening erythema. Followup blood cultures. Afebrile x24 hours. Active Problems:   Psoriasis Recommend close outpatient followup with dermatologist.   Hypertension Continue lisinopril and HCTZ.   Breast cancer Continue tamoxifen. Status post adjuvant chemotherapy completed October 2009. Status post radiation therapy completed December 2009. Under the care of Dr. Jana Hakim.   Chronic acquired lymphedema Secondary to history of breast cancer with mastectomy. Uses a flexitech and compression sleeve to reduce swelling.   DVT prophylaxis Continue Lovenox.  Code Status: Full. Family Communication: Husband updated at bedside 08/22/13. Disposition Plan: Home when stable.   IV access:  Peripheral IV  Medical Consultants:  None  Other Consultants:  None  Anti-infectives:  Clindamycin 08/21/13---> 08/21/13  Vancomycin 08/22/13--->  Zosyn 08/23/13--->  HPI/Subjective: [name] Gibson continues to have mild pain and soreness in her right arm, but reports it is much less swollen and red today. No nausea, vomiting, or diarrhea. Reports an outbreak of her  psoriasis on her feet and thinks that a small tear in her skin from a psoriatic lesion on her right finger may have precipitated the cellulitis.  Objective: Filed Vitals:   08/22/13 0557 08/22/13 1436 08/22/13 2234 08/23/13 0554  BP: 117/58 115/62 136/66 105/69  Pulse: 122 98 88 74  Temp: 100.8 F (38.2 C) 98.7 F (37.1 C) 98 F (36.7 C) 97.2 F (36.2 C)  TempSrc: Oral Oral Oral Oral  Resp: 20 20 20 18   Height:      Weight:      SpO2: 97% 97% 98% 97%    Intake/Output Summary (Last 24 hours) at 08/23/13 0957 Last data filed at 08/23/13 0554  Gross per 24 hour  Intake   1020 ml  Output   1400 ml  Net   -380 ml    Exam: Gen:  NAD Cardiovascular:  RRR, No M/R/G Respiratory:  Lungs CTAB Gastrointestinal:  Abdomen soft, NT/ND, + BS Extremities:  Decreasing RUE lymphedema, with decreasing erythema   Data Reviewed: Basic Metabolic Panel:  Recent Labs Lab 08/21/13 2125 08/22/13 0100  NA 139 138  K 4.0 4.0  CL 98 99  CO2 28 26  GLUCOSE 131* 117*  BUN 15 16  CREATININE 0.70 0.69  CALCIUM 9.3 8.9   GFR Estimated Creatinine Clearance: 103.4 ml/min (by C-G formula based on Cr of 0.69).  CBC:  Recent Labs Lab 08/21/13 2125 08/22/13 0100  WBC 16.6* 16.5*  NEUTROABS 12.5*  --   HGB 14.3 13.5  HCT 40.8 38.6  MCV 91.3 91.3  PLT 253 PLATELET CLUMPS NOTED ON SMEAR, COUNT APPEARS ADEQUATE   Microbiology Recent Results (from the past 240 hour(s))  MRSA PCR SCREENING     Status: None   Collection Time    08/22/13  3:40 PM      Result Value Range Status   MRSA by PCR NEGATIVE  NEGATIVE Final   Comment:            The GeneXpert MRSA Assay (FDA     approved for NASAL specimens     only), is one component of a     comprehensive MRSA colonization     surveillance program. It is not     intended to diagnose MRSA     infection nor to guide or     monitor treatment for     MRSA infections.     Procedures and Diagnostic Studies: No results found.  Scheduled  Meds: . enoxaparin (LOVENOX) injection  60 mg Subcutaneous Daily  . escitalopram  10 mg Oral QHS  . hydrochlorothiazide  25 mg Oral Daily  . lisinopril  40 mg Oral QHS  . piperacillin-tazobactam (ZOSYN)  IV  3.375 g Intravenous Q8H  . tamoxifen  10 mg Oral BID  . vancomycin  1,000 mg Intravenous Q12H   Continuous Infusions: . sodium chloride 20 mL/hr (08/23/13 0551)    Time spent: 35 minutes with > 50% of time discussing current diagnostic test results, clinical impression and plan of care with the patient and her husband.    LOS: 2 days   Jaishaun Mcnab  [facility] Pager [phone].   *Please note that the hospitalists switch teams on Wednesdays. Please call the flow manager at [phone] if you are having difficulty reaching the hospitalist taking care of this patient as she can update you and provide the most up-to-date pager number of provider caring for the patient. If 8PM-8AM, please contact night-coverage at [email or web address], password [email or web address]  08/23/2013, 9:57 AM   Information printed out, explained, and given to the patient:  In an effort to keep you and your family informed about your hospital stay, I am providing you with this information sheet. If you or your family have any questions, please do not hesitate to have the nursing staff page me to set up a meeting time.  AMALA PETION 08/23/2013 2 (Number of days in the hospital)  Treatment team:  Dr. Jacquelynn Cree, Hospitalist (Internist)   Active Treatment Issues with Plan: Principal Problem:   Skin infection (cellulitis) of right arm You are being treated with vancomycin and Zosyn (antibiotics). Blood cultures were obtained.  Results can take up to 3 days to come back and are still pending. Active Problems:   High blood pressure Continue lisinopril and HCTZ.   History of breast cancer Continue tamoxifen. You completed chemotherapy October 2009. You completed radiation therapy December 2009. Under the care of  Dr. Jana Hakim.   Chronic swelling of the arm (lymphedema) Secondary to history of breast cancer with mastectomy. Use a flexitech and compression sleeve to reduce swelling when you are able to get to sleep back on.   Blood clot prevention Continue Lovenox.  Anticipated discharge date: 08/24/13.

## 2013-08-24 DIAGNOSIS — L408 Other psoriasis: Secondary | ICD-10-CM

## 2013-08-24 LAB — VANCOMYCIN, TROUGH: Vancomycin Tr: 9.7 ug/mL — ABNORMAL LOW (ref 10.0–20.0)

## 2013-08-24 MED ORDER — POLYETHYLENE GLYCOL 3350 17 G PO PACK
17.0000 g | PACK | Freq: Every day | ORAL | Status: DC | PRN
  Filled 2013-08-24: qty 1

## 2013-08-24 MED ORDER — POLYETHYLENE GLYCOL 3350 17 G PO PACK: 17.0000 g | PACK | Freq: Every day | ORAL | Status: DC

## 2013-08-24 MED ORDER — DOXYCYCLINE HYCLATE 100 MG PO TABS
100.0000 mg | ORAL_TABLET | Freq: Two times a day (BID) | ORAL | Status: DC
  Administered 2013-08-24 – 2013-08-25 (×3): 100 mg via ORAL
  Filled 2013-08-24 (×4): qty 1

## 2013-08-24 MED ORDER — CEPHALEXIN 500 MG PO CAPS
500.0000 mg | ORAL_CAPSULE | Freq: Four times a day (QID) | ORAL | Status: DC
  Administered 2013-08-24 – 2013-08-25 (×3): 500 mg via ORAL
  Filled 2013-08-24 (×7): qty 1

## 2013-08-24 MED ORDER — DIPHENHYDRAMINE HCL 25 MG PO CAPS
25.0000 mg | ORAL_CAPSULE | ORAL | Status: DC | PRN
  Administered 2013-08-24 (×2): 25 mg via ORAL
  Filled 2013-08-24 (×2): qty 1

## 2013-08-24 NOTE — Progress Notes (Signed)
[facility] PROGRESS NOTE   [name] Gibson BJS:[id number] DOB: 30-Jun-1956 DOA: 08/21/2013 PCP: Vidal Schwalbe, MD  Brief narrative: [name] Gibson is an 58 y.o. female with a PMH of multifocal/multicentric breast cancer, stage IIB, grade 2 estrogen and progesterone receptor positive, status post right modified radical mastectomy in May of 2009, with chronic lymphedema to right upper extremity following mastectomy who was admitted on 08/21/13 with right upper extremity cellulitis. Upon initial evaluation in the ED, the patient was found to have a white blood cell count of 16.6 and evidence of worsening cellulitis.  Assessment/Plan: Principal Problem:   Cellulitis of arm, right Patient has cellulitis in the setting of chronic lymphedema. She was placed on empiric vancomycin on admission and Zosyn was added 08/22/13 secondary to worsening erythema. Followup blood cultures (negative to date). Afebrile x48 hours.  RUE still very erythematous and swollen.  Continue Vancomycin and Zosyn. Active Problems:   Psoriasis Recommend close outpatient followup with dermatologist.  Will send home with bactroban to use PRN for impaired skin integrity related to psoriasis.   Hypertension Continue lisinopril and HCTZ.   Breast cancer Continue tamoxifen. Status post adjuvant chemotherapy completed October 2009. Status post radiation therapy completed December 2009. Under the care of Dr. Jana Hakim.   Chronic acquired lymphedema Secondary to history of breast cancer with mastectomy. Uses a flexitech and compression sleeve to reduce swelling.  Keep RUE elevated.   DVT prophylaxis Continue Lovenox.  Code Status: Full. Family Communication: Husband updated at bedside 08/22/13. Disposition Plan: Home when stable.   IV access:  Peripheral IV  Medical Consultants:  None  Other Consultants:  None  Anti-infectives:  Clindamycin 08/21/13---> 08/21/13  Vancomycin 08/22/13--->  Zosyn  08/23/13--->  HPI/Subjective: [name] Gibson continues to have mild pain and soreness in her right arm, but reports it is is al little more swollen but less red today. No nausea, vomiting, or diarrhea. Constipated.    Objective: Filed Vitals:   08/23/13 0554 08/23/13 1347 08/23/13 2040 08/24/13 0545  BP: 105/69 114/56 124/52 126/58  Pulse: 74 79 88 70  Temp: 97.2 F (36.2 C) 97.9 F (36.6 C) 97.7 F (36.5 C) 97 F (36.1 C)  TempSrc: Oral Oral Oral Oral  Resp: 18 18 18 18   Height:      Weight:      SpO2: 97% 98% 98% 98%    Intake/Output Summary (Last 24 hours) at 08/24/13 0928 Last data filed at 08/24/13 0600  Gross per 24 hour  Intake   1410 ml  Output   2800 ml  Net  -1390 ml    Exam: Gen:  NAD Cardiovascular:  RRR, No M/R/G Respiratory:  Lungs CTAB Gastrointestinal:  Abdomen soft, NT/ND, + BS Extremities:  Massive RUE lymphedema, with decreasing erythema   Data Reviewed: Basic Metabolic Panel:  Recent Labs Lab 08/21/13 2125 08/22/13 0100  NA 139 138  K 4.0 4.0  CL 98 99  CO2 28 26  GLUCOSE 131* 117*  BUN 15 16  CREATININE 0.70 0.69  CALCIUM 9.3 8.9   GFR Estimated Creatinine Clearance: 103.4 ml/min (by C-G formula based on Cr of 0.69).  CBC:  Recent Labs Lab 08/21/13 2125 08/22/13 0100  WBC 16.6* 16.5*  NEUTROABS 12.5*  --   HGB 14.3 13.5  HCT 40.8 38.6  MCV 91.3 91.3  PLT 253 PLATELET CLUMPS NOTED ON SMEAR, COUNT APPEARS ADEQUATE   Microbiology Recent Results (from the past 240 hour(s))  CULTURE, BLOOD (ROUTINE X 2)  Status: None   Collection Time    08/22/13  1:00 AM      Result Value Range Status   Specimen Description BLOOD LEFT ANTECUBITAL   Final   Special Requests BOTTLES DRAWN AEROBIC ONLY 3CC   Final   Culture  Setup Time     Final   Value: 08/22/2013 04:23     Performed at [facility]   Culture     Final   Value:        BLOOD CULTURE RECEIVED NO GROWTH TO DATE CULTURE WILL BE HELD FOR 5 DAYS BEFORE ISSUING A  FINAL NEGATIVE REPORT     Performed at [facility]   Report Status PENDING   Incomplete  CULTURE, BLOOD (ROUTINE X 2)     Status: None   Collection Time    08/22/13  1:10 AM      Result Value Range Status   Specimen Description BLOOD LEFT ARM   Final   Special Requests BOTTLES DRAWN AEROBIC ONLY 3CC   Final   Culture  Setup Time     Final   Value: 08/22/2013 04:22     Performed at [facility]   Culture     Final   Value:        BLOOD CULTURE RECEIVED NO GROWTH TO DATE CULTURE WILL BE HELD FOR 5 DAYS BEFORE ISSUING A FINAL NEGATIVE REPORT     Performed at [facility]   Report Status PENDING   Incomplete  MRSA PCR SCREENING     Status: None   Collection Time    08/22/13  3:40 PM      Result Value Range Status   MRSA by PCR NEGATIVE  NEGATIVE Final   Comment:            The GeneXpert MRSA Assay (FDA     approved for NASAL specimens     only), is one component of a     comprehensive MRSA colonization     surveillance program. It is not     intended to diagnose MRSA     infection nor to guide or     monitor treatment for     MRSA infections.     Procedures and Diagnostic Studies: No results found.  Scheduled Meds: . enoxaparin (LOVENOX) injection  60 mg Subcutaneous Daily  . escitalopram  10 mg Oral QHS  . hydrochlorothiazide  25 mg Oral Daily  . lisinopril  40 mg Oral QHS  . piperacillin-tazobactam (ZOSYN)  IV  3.375 g Intravenous Q8H  . tamoxifen  10 mg Oral BID  . vancomycin  1,000 mg Intravenous Q12H   Continuous Infusions: . sodium chloride 20 mL/hr at 08/23/13 1400    Time spent: 25 minutes.    LOS: 3 days   [facility] Pager [phone].   *Please note that the hospitalists switch teams on Wednesdays. Please call the flow manager at [phone] if you are having difficulty reaching the hospitalist taking care of this patient as she can update you and provide the most up-to-date pager number of provider caring for  the patient. If 8PM-8AM, please contact night-coverage at [email or web address], password [facility]  08/24/2013, 9:28 AM   Information printed out, explained, and given to the patient:  In an effort to keep you and your family informed about your hospital stay, I am providing you with this information sheet. If you or your family have any questions, please do not hesitate to have the  nursing staff page me to set up a meeting time.

## 2013-08-24 NOTE — Progress Notes (Signed)
Dr Rockne Menghini called concerning patients IV infiltrating and causing arm to be red and have hive like splotches, new orders given will continue to monitor.

## 2013-08-24 NOTE — Progress Notes (Signed)
08-24-13    1425     IV Team Note;   Called to "check pt's iv site"  ;   Pt noted to have hives, and redness above iv site, extending up to ac area;  Insertion site red, sore also, per pt;  Appears to have mild phlebitis starting at insertion site;  Painful when nsl is flushed, even though blood return is noted;  RN has notified the MD and nsl is to be removed and meds changed.    Raynelle Fanning RN IV Team

## 2013-08-24 NOTE — Progress Notes (Signed)
ANTIBIOTIC CONSULT NOTE -Follow Up  Pharmacy Consult for IV Vancomycin Indication: RUE cellulitis  Allergies  Allergen Reactions  . Sulfa Antibiotics Hives   Patient Measurements: Height: 5' 6.5" (168.9 cm) Weight: 254 lb 3.2 oz (115.304 kg) IBW/kg (Calculated) : 60.45  Vital Signs: Temp: 97 F (36.1 C) (02/01 0545) Temp src: Oral (02/01 0545) BP: 126/58 mmHg (02/01 0545) Pulse Rate: 70 (02/01 0545) Intake/Output from previous day: 01/31 0701 - 02/01 0700 In: 1410 [P.O.:720; I.V.:440; IV Piggyback:250] Out: 2800 [Urine:2800] Intake/Output from this shift:    Labs:  Recent Labs  08/21/13 2125 08/22/13 0100  WBC 16.6* 16.5*  HGB 14.3 13.5  PLT 253 PLATELET CLUMPS NOTED ON SMEAR, COUNT APPEARS ADEQUATE  CREATININE 0.70 0.69   Estimated Creatinine Clearance: 103.4 ml/min (by C-G formula based on Cr of 0.69).  Recent Labs  08/24/13 1050  VANCOTROUGH 9.7*    Microbiology: Recent Results (from the past 720 hour(s))  CULTURE, BLOOD (ROUTINE X 2)     Status: None   Collection Time    08/22/13  1:00 AM      Result Value Range Status   Specimen Description BLOOD LEFT ANTECUBITAL   Final   Special Requests BOTTLES DRAWN AEROBIC ONLY 3CC   Final   Culture  Setup Time     Final   Value: 08/22/2013 04:23     Performed at [facility]   Culture     Final   Value:        BLOOD CULTURE RECEIVED NO GROWTH TO DATE CULTURE WILL BE HELD FOR 5 DAYS BEFORE ISSUING A FINAL NEGATIVE REPORT     Performed at [facility]   Report Status PENDING   Incomplete  CULTURE, BLOOD (ROUTINE X 2)     Status: None   Collection Time    08/22/13  1:10 AM      Result Value Range Status   Specimen Description BLOOD LEFT ARM   Final   Special Requests BOTTLES DRAWN AEROBIC ONLY 3CC   Final   Culture  Setup Time     Final   Value: 08/22/2013 04:22     Performed at [facility]   Culture     Final   Value:        BLOOD CULTURE RECEIVED NO GROWTH TO DATE CULTURE WILL BE  HELD FOR 5 DAYS BEFORE ISSUING A FINAL NEGATIVE REPORT     Performed at [facility]   Report Status PENDING   Incomplete  MRSA PCR SCREENING     Status: None   Collection Time    08/22/13  3:40 PM      Result Value Range Status   MRSA by PCR NEGATIVE  NEGATIVE Final   Comment:            The GeneXpert MRSA Assay (FDA     approved for NASAL specimens     only), is one component of a     comprehensive MRSA colonization     surveillance program. It is not     intended to diagnose MRSA     infection nor to guide or     monitor treatment for     MRSA infections.   Medications:  Scheduled:  . enoxaparin (LOVENOX) injection  60 mg Subcutaneous Daily  . escitalopram  10 mg Oral QHS  . hydrochlorothiazide  25 mg Oral Daily  . lisinopril  40 mg Oral QHS  . piperacillin-tazobactam (ZOSYN)  IV  3.375 g  Intravenous Q8H  . tamoxifen  10 mg Oral BID  . vancomycin  1,000 mg Intravenous Q12H   Assessment: 58 yo with hx breast Ca, now c/o right arm pain/erythema. Pt has chronic lymphedema to her RUE s/p mastectomy in '09.  Vancomycin for RUE cellulitis.  Vancomycin 2500mg  x1, then 1 Gm IV q12h  Day 3 Vancomycin & Zosyn  Vancomycin trough 9.7 mcg/ml   Blood cx 1/30: no growth  Goal of Therapy:  Vancomycin trough level 10-15 mcg/ml  Plan:   No change, continue Vancomycin 1000mg  q12  Continue Zosyn 3.375gm q8hr- 4 hr infusion  F/U SCr/levels/cultures as needed  Minda Ditto PharmD Pager [phone] 08/24/2013, 12:32 PM

## 2013-08-25 LAB — BASIC METABOLIC PANEL
BUN: 12 mg/dL (ref 6–23)
CHLORIDE: 100 meq/L (ref 96–112)
CO2: 28 meq/L (ref 19–32)
Calcium: 8.6 mg/dL (ref 8.4–10.5)
Creatinine, Ser: 0.8 mg/dL (ref 0.50–1.10)
GFR calc Af Amer: >90 mL/min (ref >90)
GFR calc non Af Amer: 81 mL/min — ABNORMAL LOW (ref >90)
GLUCOSE: 115 mg/dL — AB (ref 70–99)
POTASSIUM: 3.5 meq/L — AB (ref 3.7–5.3)
Sodium: 140 mEq/L (ref 137–147)

## 2013-08-25 LAB — CBC
HEMATOCRIT: 37.3 % (ref 36.0–46.0)
HEMOGLOBIN: 12.5 g/dL (ref 12.0–15.0)
MCH: 30.9 pg (ref 26.0–34.0)
MCHC: 33.5 g/dL (ref 30.0–36.0)
MCV: 92.1 fL (ref 78.0–100.0)
Platelets: 270 10*3/uL (ref 150–400)
RBC: 4.05 MIL/uL (ref 3.87–5.11)
RDW: 13.1 % (ref 11.5–15.5)
WBC: 9.3 10*3/uL (ref 4.0–10.5)

## 2013-08-25 MED ORDER — FLUCONAZOLE 150 MG PO TABS: 150.0000 mg | ORAL_TABLET | Freq: Every day | ORAL | Status: DC

## 2013-08-25 MED ORDER — MUPIROCIN 2 % EX OINT: 1.0000 "application " | TOPICAL_OINTMENT | Freq: Two times a day (BID) | CUTANEOUS | Status: DC

## 2013-08-25 MED ORDER — OXYCODONE HCL 5 MG PO TABS: 5.0000 mg | ORAL_TABLET | ORAL | Status: DC | PRN

## 2013-08-25 MED ORDER — POTASSIUM CHLORIDE CRYS ER 20 MEQ PO TBCR
20.0000 meq | EXTENDED_RELEASE_TABLET | Freq: Once | ORAL | Status: AC
  Administered 2013-08-25: 20 meq via ORAL
  Filled 2013-08-25: qty 1

## 2013-08-25 MED ORDER — CEPHALEXIN 500 MG PO CAPS: 500.0000 mg | ORAL_CAPSULE | Freq: Four times a day (QID) | ORAL | Status: DC

## 2013-08-25 MED ORDER — DOXYCYCLINE HYCLATE 100 MG PO TABS: 100.0000 mg | ORAL_TABLET | Freq: Two times a day (BID) | ORAL | Status: DC

## 2013-08-25 NOTE — Discharge Summary (Signed)
Physician Discharge Summary  [name] Gibson WEX:[id number] DOB: 10/14/1955 DOA: 08/21/2013  PCP: Vidal Schwalbe, MD  Admit date: 08/21/2013 Discharge date: 08/25/2013  Recommendations for Outpatient Follow-up:  1. F/U with PCP as needed for non-resolution of symptoms. 2. F/U final blood cultures, which are negative to date. 3. F/U with dermatologist for treatment of psoriasis.  Discharge Diagnoses:  Principal Problem:    Cellulitis of arm, right Active Problems:    Hypertension    Breast cancer    Chronic acquired lymphedema    Psoriasis   Discharge Condition: Improved.  Diet recommendation: Regular.  History of present illness:  [name] Gibson is an 58 y.o. female with a PMH of multifocal/multicentric breast cancer, stage IIB, grade 2 estrogen and progesterone receptor positive, status post right modified radical mastectomy in May of 2009, with chronic lymphedema to right upper extremity following mastectomy who was admitted on 08/21/13 with right upper extremity cellulitis. Upon initial evaluation in the ED, the patient was found to have a white blood cell count of 16.6 and evidence of worsening cellulitis.  Hospital Course by problem:  Principal Problem:  Cellulitis of arm, right  Patient had recurrent cellulitis in the setting of chronic lymphedema. She was placed on empiric vancomycin on admission and Zosyn was added 08/22/13 secondary to worsening erythema. Followup blood cultures which have remained negative to date. Afebrile x48 hours, with decreased erythema and swelling of the right upper extremity. Discharge home on a one-week course of Keflex and doxycycline.  Active Problems:  Psoriasis  Recommend close outpatient followup with dermatologist.  Hypertension  Continue lisinopril and HCTZ.  Breast cancer  Continue tamoxifen. Status post adjuvant chemotherapy completed October 2009. Status post radiation therapy completed December 2009. Under the care of Dr.  Jana Hakim.  Chronic acquired lymphedema  Secondary to history of breast cancer with mastectomy. Uses a flexitech and compression sleeve to reduce swelling.  DVT prophylaxis  Continue Lovenox.   Procedures:  None.  Consultations:  None.  Discharge Exam: Filed Vitals:   08/25/13 0523  BP: 124/68  Pulse: 87  Temp: 98 F (36.7 C)  Resp: 18   Filed Vitals:   08/24/13 0545 08/24/13 1500 08/24/13 2118 08/25/13 0523  BP: 126/58 126/71 147/75 124/68  Pulse: 70 68 88 87  Temp: 97 F (36.1 C) 97.6 F (36.4 C) 98.5 F (36.9 C) 98 F (36.7 C)  TempSrc: Oral Oral Oral Oral  Resp: 18 18 15 18   Height:      Weight:      SpO2: 98% 100% 98% 98%    Gen:  NAD Cardiovascular:  RRR, No M/R/G Respiratory: Lungs CTAB Gastrointestinal: Abdomen soft, NT/ND with normal active bowel sounds. Extremities: Right upper extremity with decreased erythema, ongoing significant lymphedema   Discharge Instructions  Discharge Orders   Future Appointments Provider Department Dept Phone   12/31/2013 2:45 PM [facility] [phone]   12/31/2013 3:15 PM Amy Fredderick [facility] [phone]   Future Orders Complete By Expires   Call MD for:  temperature >100.4  As directed    Call MD for:  As directed    Scheduling Instructions:     Worsening pain right arm.   Diet general  As directed    Discharge instructions  As directed    Comments:     You were cared for by Dr. Jacquelynn Cree  (a hospitalist) during your hospital stay. If you have any  questions about your discharge medications or the care you received while you were in the hospital after you are discharged, you can call the unit and ask to speak with the hospitalist on call if the hospitalist that took care of you is not available. Once you are discharged, your primary care physician will handle any further medical issues. Please note that NO REFILLS for any  discharge medications will be authorized once you are discharged, as it is imperative that you return to your primary care physician (or establish a relationship with a primary care physician if you do not have one) for your aftercare needs so that they can reassess your need for medications and monitor your lab values.  Any outstanding tests can be reviewed by your PCP at your follow up visit.  It is also important to review any medicine changes with your PCP.  Please bring these d/c instructions with you to your next visit so your physician can review these changes with you.  If you do not have a primary care physician, you can call [phone] for a physician referral.  It is highly recommended that you obtain a PCP for hospital follow up.   Increase activity slowly  As directed        Medication List         acetaminophen 500 MG tablet  Commonly known as:  TYLENOL  Take 500 mg by mouth every 6 (six) hours as needed for pain.     AMBIEN 5 MG tablet  Generic drug:  zolpidem  Take 5 mg by mouth at bedtime as needed.     CALCIUM + D PO  Take 1 tablet by mouth 2 (two) times daily.     cephALEXin 500 MG capsule  Commonly known as:  KEFLEX  Take 1 capsule (500 mg total) by mouth every 6 (six) hours.     doxycycline 100 MG tablet  Commonly known as:  VIBRA-TABS  Take 1 tablet (100 mg total) by mouth every 12 (twelve) hours.     escitalopram 10 MG tablet  Commonly known as:  LEXAPRO  Take 1 tablet (10 mg total) by mouth daily.     fluconazole 150 MG tablet  Commonly known as:  DIFLUCAN  Take 1 tablet (150 mg total) by mouth daily.     hydrochlorothiazide 25 MG tablet  Commonly known as:  HYDRODIURIL  Take 25 mg by mouth daily.     lisinopril 40 MG tablet  Commonly known as:  PRINIVIL,ZESTRIL  Take 40 mg by mouth daily.     mupirocin ointment 2 %  Commonly known as:  BACTROBAN  Place 1 application into the nose 2 (two) times daily. Use as needed for breaks in the skin to prevent  recurrent cellulitis.     oxyCODONE 5 MG immediate release tablet  Commonly known as:  Oxy IR/ROXICODONE  Take 1 tablet (5 mg total) by mouth every 4 (four) hours as needed for moderate pain.     potassium chloride SA 20 MEQ tablet  Commonly known as:  K-DUR,KLOR-CON  Take 20 mEq by mouth daily.     tamoxifen 20 MG tablet  Commonly known as:  NOLVADEX  Take 20 mg by mouth daily.           Follow-up Information   Schedule an appointment as soon as possible for a visit with Vidal Schwalbe, MD. (As needed)    Specialty:  Family Medicine   Contact information:   [place],  [place] [address] [phone]        The results of significant diagnostics from this hospitalization (including imaging, microbiology, ancillary and laboratory) are listed below for reference.    Significant Diagnostic Studies: No results found.  Labs:  Basic Metabolic Panel:  Recent Labs Lab 08/21/13 2125 08/22/13 0100 08/25/13 0413  NA 139 138 140  K 4.0 4.0 3.5*  CL 98 99 100  CO2 28 26 28   GLUCOSE 131* 117* 115*  BUN 15 16 12   CREATININE 0.70 0.69 0.80  CALCIUM 9.3 8.9 8.6   GFR Estimated Creatinine Clearance: 103.4 ml/min (by C-G formula based on Cr of 0.8).  CBC:  Recent Labs Lab 08/21/13 2125 08/22/13 0100 08/25/13 0413  WBC 16.6* 16.5* 9.3  NEUTROABS 12.5*  --   --   HGB 14.3 13.5 12.5  HCT 40.8 38.6 37.3  MCV 91.3 91.3 92.1  PLT 253 PLATELET CLUMPS NOTED ON SMEAR, COUNT APPEARS ADEQUATE 270   Microbiology Recent Results (from the past 240 hour(s))  CULTURE, BLOOD (ROUTINE X 2)     Status: None   Collection Time    08/22/13  1:00 AM      Result Value Range Status   Specimen Description BLOOD LEFT ANTECUBITAL   Final   Special Requests BOTTLES DRAWN AEROBIC ONLY 3CC   Final   Culture  Setup Time     Final   Value: 08/22/2013 04:23     Performed at [facility]   Culture     Final   Value:        BLOOD CULTURE RECEIVED NO GROWTH TO  DATE CULTURE WILL BE HELD FOR 5 DAYS BEFORE ISSUING A FINAL NEGATIVE REPORT     Performed at [facility]   Report Status PENDING   Incomplete  CULTURE, BLOOD (ROUTINE X 2)     Status: None   Collection Time    08/22/13  1:10 AM      Result Value Range Status   Specimen Description BLOOD LEFT ARM   Final   Special Requests BOTTLES DRAWN AEROBIC ONLY 3CC   Final   Culture  Setup Time     Final   Value: 08/22/2013 04:22     Performed at [facility]   Culture     Final   Value:        BLOOD CULTURE RECEIVED NO GROWTH TO DATE CULTURE WILL BE HELD FOR 5 DAYS BEFORE ISSUING A FINAL NEGATIVE REPORT     Performed at [facility]   Report Status PENDING   Incomplete  MRSA PCR SCREENING     Status: None   Collection Time    08/22/13  3:40 PM      Result Value Range Status   MRSA by PCR NEGATIVE  NEGATIVE Final   Comment:            The GeneXpert MRSA Assay (FDA     approved for NASAL specimens     only), is one component of a     comprehensive MRSA colonization     surveillance program. It is not     intended to diagnose MRSA     infection nor to guide or     monitor treatment for     MRSA infections.    Time coordinating discharge: 25 minutes.  Signed:  Bartlomiej Jenkinson  Pager [phone] [facility] 08/25/2013, 8:58 AM

## 2013-08-25 NOTE — Progress Notes (Signed)
Pt. Was discharged home she was given her discharge instructions, prescriptions, and all questions were answered.  She was offered a wheelchair but pt. Declined she walked out of the hospital on her own and was transported home by family.

## 2013-08-25 NOTE — Discharge Instructions (Signed)
Cellulitis Cellulitis is an infection of the skin and the tissue beneath it. The infected area is usually red and tender. Cellulitis occurs most often in the arms and lower legs.  CAUSES  Cellulitis is caused by bacteria that enter the skin through cracks or cuts in the skin. The most common types of bacteria that cause cellulitis are Staphylococcus and Streptococcus. SYMPTOMS   Redness and warmth.  Swelling.  Tenderness or pain.  Fever. DIAGNOSIS  Your caregiver can usually determine what is wrong based on a physical exam. Blood tests may also be done. TREATMENT  Treatment usually involves taking an antibiotic medicine. HOME CARE INSTRUCTIONS   Take your antibiotics as directed. Finish them even if you start to feel better.  Keep the infected arm or leg elevated to reduce swelling.  Apply a warm cloth to the affected area up to 4 times per day to relieve pain.  Only take over-the-counter or prescription medicines for pain, discomfort, or fever as directed by your caregiver.  Keep all follow-up appointments as directed by your caregiver. SEEK MEDICAL CARE IF:   You notice red streaks coming from the infected area.  Your red area gets larger or turns dark in color.  Your bone or joint underneath the infected area becomes painful after the skin has healed.  Your infection returns in the same area or another area.  You notice a swollen bump in the infected area.  You develop new symptoms. SEEK IMMEDIATE MEDICAL CARE IF:   You have a fever.  You feel very sleepy.  You develop vomiting or diarrhea.  You have a general ill feeling (malaise) with muscle aches and pains. MAKE SURE YOU:   Understand these instructions.  Will watch your condition.  Will get help right away if you are not doing well or get worse. Document Released: 04/19/2005 Document Revised: 01/09/2012 Document Reviewed: 09/25/2011 [employer] Patient Information 2014 [employer].  

## 2013-08-28 LAB — CULTURE, BLOOD (ROUTINE X 2)
CULTURE: NO GROWTH
CULTURE: NO GROWTH

## 2013-11-05 ENCOUNTER — Encounter: Payer: Self-pay | Admitting: Women's Health

## 2013-11-06 ENCOUNTER — Encounter: Payer: Self-pay | Admitting: Women's Health

## 2013-11-13 ENCOUNTER — Other Ambulatory Visit (HOSPITAL_COMMUNITY)
Admission: RE | Admit: 2013-11-13 | Discharge: 2013-11-13 | Disposition: A | Discharge status: Home / Self Care | Payer: 59 | Source: Ambulatory Visit | Attending: Gynecology | Admitting: Gynecology

## 2013-11-13 ENCOUNTER — Encounter: Payer: Self-pay | Admitting: Women's Health

## 2013-11-13 ENCOUNTER — Ambulatory Visit (INDEPENDENT_AMBULATORY_CARE_PROVIDER_SITE_OTHER): Payer: 59 | Admitting: Women's Health

## 2013-11-13 VITALS — BP 122/78 | Ht 66.5 in | Wt 255.0 lb

## 2013-11-13 DIAGNOSIS — Z01419 Encounter for gynecological examination (general) (routine) without abnormal findings: Secondary | ICD-10-CM

## 2013-11-13 DIAGNOSIS — N83209 Unspecified ovarian cyst, unspecified side: Secondary | ICD-10-CM

## 2013-11-13 NOTE — Patient Instructions (Signed)
Health Recommendations for Postmenopausal Women Respected and ongoing research has looked at the most common causes of death, disability, and poor quality of life in postmenopausal women. The causes include heart disease, diseases of blood vessels, diabetes, depression, cancer, and bone loss (osteoporosis). Many things can be done to help lower the chances of developing these and other common problems: CARDIOVASCULAR DISEASE Heart Disease: A heart attack is a medical emergency. Know the signs and symptoms of a heart attack. Below are things women can do to reduce their risk for heart disease.   Do not smoke. If you smoke, quit.  Aim for a healthy weight. Being overweight causes many preventable deaths. Eat a healthy and balanced diet and drink an adequate amount of liquids.  Get moving. Make a commitment to be more physically active. Aim for 30 minutes of activity on most, if not all days of the week.  Eat for heart health. Choose a diet that is low in saturated fat and cholesterol and eliminate trans fat. Include whole grains, vegetables, and fruits. Read and understand the labels on food containers before buying.  Know your numbers. Ask your caregiver to check your blood pressure, cholesterol (total, HDL, LDL, triglycerides) and blood glucose. Work with your caregiver on improving your entire clinical picture.  High blood pressure. Limit or stop your table salt intake (try salt substitute and food seasonings). Avoid salty foods and drinks. Read labels on food containers before buying. Eating well and exercising can help control high blood pressure. STROKE  Stroke is a medical emergency. Stroke may be the result of a blood clot in a blood vessel in the brain or by a brain hemorrhage (bleeding). Know the signs and symptoms of a stroke. To lower the risk of developing a stroke:  Avoid fatty foods.  Quit smoking.  Control your diabetes, blood pressure, and irregular heart rate. THROMBOPHLEBITIS  (BLOOD CLOT) OF THE LEG  Becoming overweight and leading a stationary lifestyle may also contribute to developing blood clots. Controlling your diet and exercising will help lower the risk of developing blood clots. CANCER SCREENING  Breast Cancer: Take steps to reduce your risk of breast cancer.  You should practice "breast self-awareness." This means understanding the normal appearance and feel of your breasts and should include breast self-examination. Any changes detected, no matter how small, should be reported to your caregiver.  After age 40, you should have a clinical breast exam (CBE) every year.  Starting at age 40, you should consider having a mammogram (breast X-ray) every year.  If you have a family history of breast cancer, talk to your caregiver about genetic screening.  If you are at high risk for breast cancer, talk to your caregiver about having an MRI and a mammogram every year.  Intestinal or Stomach Cancer: Tests to consider are a rectal exam, fecal occult blood, sigmoidoscopy, and colonoscopy. Women who are high risk may need to be screened at an earlier age and more often.  Cervical Cancer:  Beginning at age 30, you should have a Pap test every 3 years as long as the past 3 Pap tests have been normal.  If you have had past treatment for cervical cancer or a condition that could lead to cancer, you need Pap tests and screening for cancer for at least 20 years after your treatment.  If you had a hysterectomy for a problem that was not cancer or a condition that could lead to cancer, then you no longer need Pap tests.    If you are between ages 65 and 70, and you have had normal Pap tests going back 10 years, you no longer need Pap tests.  If Pap tests have been discontinued, risk factors (such as a new sexual partner) need to be reassessed to determine if screening should be resumed.  Some medical problems can increase the chance of getting cervical cancer. In these  cases, your caregiver may recommend more frequent screening and Pap tests.  Uterine Cancer: If you have vaginal bleeding after reaching menopause, you should notify your caregiver.  Ovarian cancer: Other than yearly pelvic exams, there are no reliable tests available to screen for ovarian cancer at this time except for yearly pelvic exams.  Lung Cancer: Yearly chest X-rays can detect lung cancer and should be done on high risk women, such as cigarette smokers and women with chronic lung disease (emphysema).  Skin Cancer: A complete body skin exam should be done at your yearly examination. Avoid overexposure to the sun and ultraviolet light lamps. Use a strong sun block cream when in the sun. All of these things are important in lowering the risk of skin cancer. MENOPAUSE Menopause Symptoms: Hormone therapy products are effective for treating symptoms associated with menopause:  Moderate to severe hot flashes.  Night sweats.  Mood swings.  Headaches.  Tiredness.  Loss of sex drive.  Insomnia.  Other symptoms. Hormone replacement carries certain risks, especially in older women. Women who use or are thinking about using estrogen or estrogen with progestin treatments should discuss that with their caregiver. Your caregiver will help you understand the benefits and risks. The ideal dose of hormone replacement therapy is not known. The Food and Drug Administration (FDA) has concluded that hormone therapy should be used only at the lowest doses and for the shortest amount of time to reach treatment goals.  OSTEOPOROSIS Protecting Against Bone Loss and Preventing Fracture: If you use hormone therapy for prevention of bone loss (osteoporosis), the risks for bone loss must outweigh the risk of the therapy. Ask your caregiver about other medications known to be safe and effective for preventing bone loss and fractures. To guard against bone loss or fractures, the following is recommended:  If  you are less than age 50, take 1000 mg of calcium and at least 600 mg of Vitamin D per day.  If you are greater than age 50 but less than age 70, take 1200 mg of calcium and at least 600 mg of Vitamin D per day.  If you are greater than age 70, take 1200 mg of calcium and at least 800 mg of Vitamin D per day. Smoking and excessive alcohol intake increases the risk of osteoporosis. Eat foods rich in calcium and vitamin D and do weight bearing exercises several times a week as your caregiver suggests. DIABETES Diabetes Melitus: If you have Type I or Type 2 diabetes, you should keep your blood sugar under control with diet, exercise and recommended medication. Avoid too many sweets, starchy and fatty foods. Being overweight can make control more difficult. COGNITION AND MEMORY Cognition and Memory: Menopausal hormone therapy is not recommended for the prevention of cognitive disorders such as Alzheimer's disease or memory loss.  DEPRESSION  Depression may occur at any age, but is common in elderly women. The reasons may be because of physical, medical, social (loneliness), or financial problems and needs. If you are experiencing depression because of medical problems and control of symptoms, talk to your caregiver about this. Physical activity and   exercise may help with mood and sleep. Community and volunteer involvement may help your sense of value and worth. If you have depression and you feel that the problem is getting worse or becoming severe, talk to your caregiver about treatment options that are best for you. ACCIDENTS  Accidents are common and can be serious in the elderly woman. Prepare your house to prevent accidents. Eliminate throw rugs, place hand bars in the bath, shower and toilet areas. Avoid wearing high heeled shoes or walking on wet, snowy, and icy areas. Limit or stop driving if you have vision or hearing problems, or you feel you are unsteady with you movements and  reflexes. HEPATITIS C Hepatitis C is a type of viral infection affecting the liver. It is spread mainly through contact with blood from an infected person. It can be treated, but if left untreated, it can lead to severe liver damage over years. Many people who are infected do not know that the virus is in their blood. If you are a "baby-boomer", it is recommended that you have one screening test for Hepatitis C. IMMUNIZATIONS  Several immunizations are important to consider having during your senior years, including:   Tetanus, diptheria, and pertussis booster shot.  Influenza every year before the flu season begins.  Pneumonia vaccine.  Shingles vaccine.  Others as indicated based on your specific needs. Talk to your caregiver about these. Document Released: 09/01/2005 Document Revised: 06/26/2012 Document Reviewed: 04/27/2008 [employer] Patient Information 2014 [employer].  

## 2013-11-13 NOTE — Progress Notes (Signed)
DALENA PLANTZ 04-19-1956 [id number]    History:    Presents for annual exam. Amenorrheic, [place] 2.02-2013, 4 -2013, 5 -2012. History of lobular right breast cancer  ER/ PR positive, HER Neg 10/2007-mastectomy, radiation and chemotherapy, on tamoxifen/year 5. BRCA negative.  Severe Rt arm lymphedema. History of left ovarian cyst avascular 45 mm mean persistent since 2009 low/normal Ca125. Normal DEXA 10/2011 T score +1.7 AP spine, bilateral hip average 1.2. Morbidly obese. History of an CIN-3 with conization in 89 and normal Paps after. Colonoscopy 09/2013 with benign polyps. Hospitalized twice this past year for cellulitis on right arm. Canceled hysterectomy due to hospitalization and will continue with annual ultrasounds.  Past medical history, past surgical history, family history and social history were all reviewed and documented in the EPIC chart. Daughter Vicente Males 36 at [employer] struggling with grades, Rodman Key 15 doing well. Husband has MS and can have anger issues. History of a pituitary microadenoma, resolved on last MRI, normal prolactin. Works in Engineer, technical sales at [employer]. Psoriasis on feet, elbow and hands.   ROS:  A  ROS was performed and pertinent positives and negatives are included.  Exam:  Filed Vitals:   11/13/13 1606  BP: 122/78    General appearance:  Normal, obese Thyroid:  Symmetrical, normal in size, without palpable masses or nodularity. Respiratory  Auscultation:  Clear without wheezing or rhonchi Cardiovascular  Auscultation:  Regular rate, without rubs, murmurs or gallops  Edema/varicosities:  Not grossly evident Abdominal  Soft,nontender, without masses, guarding or rebound.  Liver/spleen:  No organomegaly noted  Hernia:  None appreciated  Skin  Inspection:  Psoriasis on feet, R hand and L elbow   Breasts: Examined lying and sitting.     Right: Mastectomy with R arm lymphedema     Left: Without masses, retractions, discharge or axillary adenopathy. Gentitourinary    Inguinal/mons:  Normal without inguinal adenopathy  External genitalia:  Normal  BUS/Urethra/Skene's glands:  Normal  Vagina:  atrophic  Cervix:  Normal  Uterus:  normal in size, shape and contour.  Midline and mobile  Adnexa/parametria:     Rt: Without masses or tenderness.   Lt: Without masses or tenderness.  Anus and perineum: Normal  Digital rectal exam: Normal sphincter tone without palpated masses or tenderness  Assessment/Plan:  58 y.o.  M WF G2P2 for annual exam with no complaints.   Amenorrheic on tamoxifen  Right breast cancer 10/2007-mastectomy, radiation, chemotherapy.  Morbid obesity   Lymphedema R arm Persistent left ovarian cyst  Hypertension-primary care labs and meds 1988 for CIN-3 cone -  normal Paps after Psoriasis-dermatologist, tanning bed  Plan: Normal Pap 2014. SBE's, continue annual mammogram, calcium rich diet, vitamin D 2000 daily encouraged. Reviewed importance of decreasing calories and increasing regular exercise for weight loss. Rare intercourse/husbands health - continue condoms/withdrawal until [facility] is elevated. Pelvic ultrasound, will schedule. Pap and UA. Instructed to check [facility] at primary care with next blood draw.  [place], 4:49 PM 11/13/2013

## 2013-11-14 LAB — URINALYSIS W MICROSCOPIC + REFLEX CULTURE
Bacteria, UA: NONE SEEN
Bilirubin Urine: NEGATIVE
CASTS: NONE SEEN
CRYSTALS: NONE SEEN
GLUCOSE, UA: NEGATIVE mg/dL
Hgb urine dipstick: NEGATIVE
KETONES UR: NEGATIVE mg/dL
Leukocytes, UA: NEGATIVE
NITRITE: NEGATIVE
PH: 5.5 (ref 5.0–8.0)
Protein, ur: NEGATIVE mg/dL
SPECIFIC GRAVITY, URINE: 1.015 (ref 1.005–1.030)
Urobilinogen, UA: 0.2 mg/dL (ref 0.0–1.0)

## 2013-12-09 ENCOUNTER — Telehealth: Payer: Self-pay | Admitting: *Deleted

## 2013-12-09 ENCOUNTER — Other Ambulatory Visit: Payer: Self-pay | Admitting: *Deleted

## 2013-12-09 DIAGNOSIS — C50919 Malignant neoplasm of unspecified site of unspecified female breast: Secondary | ICD-10-CM

## 2013-12-09 DIAGNOSIS — I890 Lymphedema, not elsewhere classified: Secondary | ICD-10-CM

## 2013-12-09 DIAGNOSIS — I10 Essential (primary) hypertension: Secondary | ICD-10-CM

## 2013-12-09 NOTE — Telephone Encounter (Signed)
Patient called to r/s visit with lab/MD in June, she will be out of town. Also insist on having tumor marker drawn for her own "peace of mind". Informed patient she may be required to sign ABN if not covered, patient agreed. Patient also complains that she has intermittent shortness of breath and cough, no history of bronchitis or asthma. Has not seen her primary, but would like a CXR also before making that appt if problem persist. We will get an xray, but patient instructed to seek medical assistance if condition worsens before this appt. Will r/s appts, and have labs and xray done day before appt with Micah Flesher, PA.

## 2013-12-23 ENCOUNTER — Ambulatory Visit (HOSPITAL_COMMUNITY)
Admission: RE | Admit: 2013-12-23 | Discharge: 2013-12-23 | Disposition: A | Discharge status: Home / Self Care | Payer: 59 | Source: Ambulatory Visit | Attending: Oncology | Admitting: Oncology

## 2013-12-23 ENCOUNTER — Other Ambulatory Visit (HOSPITAL_BASED_OUTPATIENT_CLINIC_OR_DEPARTMENT_OTHER): Payer: 59

## 2013-12-23 DIAGNOSIS — R05 Cough: Secondary | ICD-10-CM | POA: Insufficient documentation

## 2013-12-23 DIAGNOSIS — I10 Essential (primary) hypertension: Secondary | ICD-10-CM

## 2013-12-23 DIAGNOSIS — R079 Chest pain, unspecified: Secondary | ICD-10-CM | POA: Insufficient documentation

## 2013-12-23 DIAGNOSIS — C50919 Malignant neoplasm of unspecified site of unspecified female breast: Secondary | ICD-10-CM | POA: Insufficient documentation

## 2013-12-23 DIAGNOSIS — Z9010 Acquired absence of unspecified breast and nipple: Secondary | ICD-10-CM | POA: Insufficient documentation

## 2013-12-23 DIAGNOSIS — R059 Cough, unspecified: Secondary | ICD-10-CM | POA: Insufficient documentation

## 2013-12-23 DIAGNOSIS — R0602 Shortness of breath: Secondary | ICD-10-CM | POA: Insufficient documentation

## 2013-12-23 DIAGNOSIS — I890 Lymphedema, not elsewhere classified: Secondary | ICD-10-CM

## 2013-12-23 LAB — CBC WITH DIFFERENTIAL/PLATELET
BASO%: 0.6 % (ref 0.0–2.0)
Basophils Absolute: 0.1 10*3/uL (ref 0.0–0.1)
EOS%: 2.4 % (ref 0.0–7.0)
Eosinophils Absolute: 0.2 10*3/uL (ref 0.0–0.5)
HEMATOCRIT: 40 % (ref 34.8–46.6)
HGB: 13.7 g/dL (ref 11.6–15.9)
LYMPH%: 31 % (ref 14.0–49.7)
MCH: 31 pg (ref 25.1–34.0)
MCHC: 34.3 g/dL (ref 31.5–36.0)
MCV: 90.5 fL (ref 79.5–101.0)
MONO#: 0.7 10*3/uL (ref 0.1–0.9)
MONO%: 8.6 % (ref 0.0–14.0)
NEUT#: 4.6 10*3/uL (ref 1.5–6.5)
NEUT%: 57.4 % (ref 38.4–76.8)
Platelets: 263 10*3/uL (ref 145–400)
RBC: 4.42 10*6/uL (ref 3.70–5.45)
RDW: 12.6 % (ref 11.2–14.5)
WBC: 8.1 10*3/uL (ref 3.9–10.3)
lymph#: 2.5 10*3/uL (ref 0.9–3.3)

## 2013-12-23 LAB — COMPREHENSIVE METABOLIC PANEL (CC13)
ALK PHOS: 66 U/L (ref 40–150)
ALT: 16 U/L (ref 0–55)
AST: 15 U/L (ref 5–34)
Albumin: 3.5 g/dL (ref 3.5–5.0)
Anion Gap: 16 mEq/L — ABNORMAL HIGH (ref 3–11)
BUN: 15.6 mg/dL (ref 7.0–26.0)
CO2: 24 mEq/L (ref 22–29)
CREATININE: 0.8 mg/dL (ref 0.6–1.1)
Calcium: 9 mg/dL (ref 8.4–10.4)
Chloride: 103 mEq/L (ref 98–109)
Glucose: 106 mg/dl (ref 70–140)
Potassium: 3.9 mEq/L (ref 3.5–5.1)
Sodium: 143 mEq/L (ref 136–145)
Total Bilirubin: 0.26 mg/dL (ref 0.20–1.20)
Total Protein: 6.7 g/dL (ref 6.4–8.3)

## 2013-12-24 ENCOUNTER — Ambulatory Visit (HOSPITAL_BASED_OUTPATIENT_CLINIC_OR_DEPARTMENT_OTHER): Payer: 59 | Admitting: Physician Assistant

## 2013-12-24 ENCOUNTER — Telehealth: Payer: Self-pay | Admitting: Oncology

## 2013-12-24 ENCOUNTER — Encounter: Payer: Self-pay | Admitting: Physician Assistant

## 2013-12-24 VITALS — BP 127/71 | HR 103 | Temp 98.4°F | Resp 18 | Ht 66.0 in | Wt 255.9 lb

## 2013-12-24 DIAGNOSIS — I890 Lymphedema, not elsewhere classified: Secondary | ICD-10-CM

## 2013-12-24 DIAGNOSIS — L409 Psoriasis, unspecified: Secondary | ICD-10-CM

## 2013-12-24 DIAGNOSIS — Z170 Estrogen receptor positive status [ER+]: Secondary | ICD-10-CM

## 2013-12-24 DIAGNOSIS — Z1231 Encounter for screening mammogram for malignant neoplasm of breast: Secondary | ICD-10-CM

## 2013-12-24 DIAGNOSIS — C50919 Malignant neoplasm of unspecified site of unspecified female breast: Secondary | ICD-10-CM

## 2013-12-24 DIAGNOSIS — Z853 Personal history of malignant neoplasm of breast: Secondary | ICD-10-CM

## 2013-12-24 LAB — CANCER ANTIGEN 27.29: CA 27.29: 20 U/mL (ref 0–39)

## 2013-12-24 MED ORDER — TAMOXIFEN CITRATE 20 MG PO TABS: 20.0000 mg | ORAL_TABLET | Freq: Every day | ORAL | Status: DC

## 2013-12-24 NOTE — Progress Notes (Signed)
ID: [name] Gibson   DOB: 03-Oct-1955  MR#: [id number]  JJO#:[id number]  PCP: Vidal Schwalbe, MD ZSW:FUXNA Young, NP SU: Johnathan Hausen, MD OTHER MD:  CHIEF COMPLAINT:  Hx of Right Breast Cancer (tamoxifen)   HISTORY OF PRESENT ILLNESS: She had her annual screening mammogram on 10-24-07.  This showed a possible area of asymmetry in the right breast and she returned on 10-29-07 for diagnostic mammography and right breast ultrasound.  In the lower inner right breast, there was an area of architectural distortion which by ultrasound measured 2.9 cm.  It was ill-defined and hypoechoic.  In addition, in a different quadrant, there was an ill-defined hypoechoic mass up to 8 mm.  Breast specific gamma imaging was obtained on the same day and indeed showed two areas of abnormal uptake as already described.  The one inferiorly measured 2.2 cm. and the one superiorly 7 mm by breast specific gamma imaging.  The patient underwent ultrasound-guided biopsy of both right breast lesions on 11-04-07.  The final pathology showed:  1. An invasive lobular carcinoma (E-cadherin negative). 2. An invasive ductal carcinoma with some evidence of ductal carcinoma in situ.    Her subsequent history is as detailed below  INTERVAL HISTORY: [name] Gibson returns alone today for routine one-year followup of her right breast carcinoma. She continues on tamoxifen which she is tolerating well. She's beginning to have more hot flashes, especially at night. She has had no menstrual cycles since her chemotherapy in 2009, but she tells me that labs through her GYN office do not yet show her to be postmenopausal. She's had no vaginal bleeding whatsoever. She's had no evidence of blood clots, no swelling in the lower extremities, and no change in vision. She does have severe swelling and lymphedema in the right upper extremity. This has become somewhat limiting, both with her range of motion, and also with what she is able to wear.  [name] Gibson  continues to work for [employer], and she stays very busy. Her oldest daughter is living at home and going to [facility] for nursing. Her son is getting ready to turn 60 and get his driver's license. [name] Gibson is hoping this will give her more time for herself, and she admits that she would like to try to exercise on a more regular basis. She does complain of shortness of breath with even minor exertion, likely secondary to deconditioning. A recent chest x-ray was completely benign, and this is detailed below.   REVIEW OF SYSTEMS: [name] Gibson denies any recent illnesses and has had no fevers or chills. She denies any rashes or skin changes other than her chronic psoriasis which is stable.  Her energy level is low. Her appetite is good. She denies any problems with nausea and has had no change in bowel or bladder habits. She denies any cough, phlegm production, orthopnea, chest pain, or palpitations. She does tell me that the right chest wall feels "funny" sometimes, but denies actual pain. She's had no abnormal headaches or dizziness. She denies any new or unusual myalgias, arthralgias, or bony pain.  A detailed review of systems is otherwise stable and noncontributory.   PAST MEDICAL HISTORY: Past Medical History  Diagnosis Date  . 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 Ovarian cyst   . Cancer     Lobular breast cancer -Right  . Pituitary microadenoma   . Hypertension   . Swelling of arm   . Complication of anesthesia     some nausea after  second mastectomy surgery  . Cellulitis of arm, right 08/21/2013  . Psoriasis   1. History of a possible mitral valve prolapse murmur (the patient currently does not take prophylactic antibiotics before dental procedures). 2. History of Fen-Phen use. 3. History of minimal urinary incontinence. Marland Kitchen    PAST SURGICAL HISTORY: Past Surgical History  Procedure Laterality Date  . Tonsillectomy    . Anal fistula repair    . Breast surgery      Right  Mastectomy  . Cervical cone biopsy    . Cholecystectomy    . Colposcopy      FAMILY HISTORY Family History  Problem Relation Age of Onset  . Hypertension Mother   . Melanoma Mother   . Diabetes Father   . Hypertension Father   . Heart disease Father   . Parkinsonism Father   . Breast cancer Cousin 59    passed away form disease  . COPD Brother   . Breast cancer Cousin   The patient's father died at the age of 40 in the setting of Parkinson's disease and multiple other medical problems.  The patient's mother is alive at 48.  She has a history of foot melanoma.  The patient has one brother and one sister.  There is no breast cancer or ovarian cancer in the immediate family.  In the patient's mother's father's family, however, two daughters of one sister have been diagnosed with cancer, one at the age of 69 who unfortunately succumbed and one at the age of 69.  So this would be two first cousins out of a large number of cousins (recall that the patient's father was one of 12 siblings).  GYNECOLOGIC HISTORY:  (Updated 12/24/2013) She is GX P2, first pregnancy to term age 22. Last menstrual period prior to chemo in 2009.  She has occasional hot flashes.     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  Her husband, [name] Gibson, is a Naval architect but he is partially disabled with MS, and he is currently doing some mediation.  They have a 71 year old daughter and a 76 year old son.   ADVANCED DIRECTIVES: not in place  HEALTH MAINTENANCE: History  Substance Use Topics  . Smoking status: Former Research scientist (life sciences)  . Smokeless tobacco: Never Used  . Alcohol Use: No     Colonoscopy:    Not on file   PAP: April 2015   Bone density:  April 2013 at [facility], normal   Lipid panel: not on file/Dr. Dema Severin   Allergies  Allergen Reactions  . Sulfa Antibiotics Hives    Current Outpatient Prescriptions   Medication Sig Dispense Refill  . Calcium Carbonate-Vitamin D (CALCIUM + D PO) Take 1 tablet by mouth 2 (two) times daily.       . Cholecalciferol (VITAMIN D PO) Take by mouth.      . escitalopram (LEXAPRO) 10 MG tablet Take 1 tablet (10 mg total) by mouth daily.  90 tablet  4  . hydrochlorothiazide (HYDRODIURIL) 25 MG tablet Take 25 mg by mouth daily.      Marland Kitchen lisinopril (PRINIVIL,ZESTRIL) 40 MG tablet Take 40 mg by mouth daily.      . potassium chloride SA (K-DUR,KLOR-CON) 20 MEQ tablet Take 20 mEq by mouth daily.      . tamoxifen (NOLVADEX) 20 MG tablet Take 20 mg by mouth daily.      Marland Kitchen zolpidem (AMBIEN) 5 MG tablet Take 5  mg by mouth at bedtime as needed.      Marland Kitchen acetaminophen (TYLENOL) 500 MG tablet Take 500 mg by mouth every 6 (six) hours as needed for pain.      . mupirocin ointment (BACTROBAN) 2 % Place 1 application into the nose 2 (two) times daily. Use as needed for breaks in the skin to prevent recurrent cellulitis.  22 g  0   No current facility-administered medications for this visit.    OBJECTIVE: Middle-aged white woman  who appears comfortable and is in no acute distress Filed Vitals:   12/24/13 1517  BP: 127/71  Gibson: 103  Temp: 98.4 F (36.9 C)  Resp: 18     Body mass index is 41.32 kg/(m^2).    ECOG FS: 1 Filed Weights   12/24/13 1517  Weight: 255 lb 14.4 oz (116.075 kg)   Physical Exam: HEENT:  Sclerae anicteric.  Oropharynx clear, pink, and moist. Neck supple. Trachea midline.  NODES:  No cervical or supraclavicular lymphadenopathy palpated.  BREAST EXAM: Patient is status post right mastectomy. Diffuse telangiectasias are noted on the chest wall, but no additional skin changes. No suspicious nodularity. No evidence of recurrence in the right chest wall. Left breast is unremarkable. Axillae are benign bilaterally, with no palpable lymphadenopathy. LUNGS:  Clear to auscultation bilaterally with good excursion.  No wheezes or rhonchi HEART:  Regular rate and  rhythm. No murmur  ABDOMEN:  Soft, obese, nontender.  Positive bowel sounds.  MSK:  No focal spinal tenderness to palpation. Limited range of motion in the right upper extremity secondary to lymphedema. EXTREMITIES:  No peripheral edema.  Massive right upper extremity lymphedema, pitting. SKIN:  Scattered patches of psoriasis. Otherwise no rashes or skin lesions are visible. No excessive ecchymoses. No petechiae. No pallor. Skin is warm and dry. NEURO:  Nonfocal. Well oriented.  Appropriate affect.    LAB RESULTS: Lab Results  Component Value Date   WBC 8.1 12/23/2013   NEUTROABS 4.6 12/23/2013   HGB 13.7 12/23/2013   HCT 40.0 12/23/2013   MCV 90.5 12/23/2013   PLT 263 12/23/2013      Chemistry      Component Value Date/Time   NA 143 12/23/2013 1411   NA 140 08/25/2013 0413   K 3.9 12/23/2013 1411   K 3.5* 08/25/2013 0413   CL 100 08/25/2013 0413   CL 103 12/20/2012 1054   CO2 24 12/23/2013 1411   CO2 28 08/25/2013 0413   BUN 15.6 12/23/2013 1411   BUN 12 08/25/2013 0413   BUN 16 11/02/2010 1613   CREATININE 0.8 12/23/2013 1411   CREATININE 0.80 08/25/2013 0413   CREATININE 0.8 11/02/2010 1613      Component Value Date/Time   CALCIUM 9.0 12/23/2013 1411   CALCIUM 8.6 08/25/2013 0413   ALKPHOS 66 12/23/2013 1411   ALKPHOS 121* 12/12/2012 0558   AST 15 12/23/2013 1411   AST 56* 12/12/2012 0558   ALT 16 12/23/2013 1411   ALT 49* 12/12/2012 0558   BILITOT 0.26 12/23/2013 1411   BILITOT 0.5 12/12/2012 0558       Lab Results  Component Value Date   LABCA2 20 12/23/2013     STUDIES:  Dg Chest 2 View 12/23/2013   CLINICAL DATA:  Right-sided chest pain and shortness of breath. Cough, breast cancer.  EXAM: CHEST  2 VIEW  COMPARISON:  07/15/2012.  FINDINGS: Trachea is midline. Heart size stable. Lungs are clear. No pleural fluid. Postoperative changes of right mastectomy and right  axillary lymph node dissection are noted.  IMPRESSION: No acute findings.   Electronically Signed   By: Lorin Picket M.D.   On: 12/23/2013  14:42    Most recent left mammogram on 11/03/2013 at [facility] was unremarkable.   Most recent bone density in [facility] in April 2013 was normal.    ASSESSMENT: 58 y.o.  [place] woman   (1) status post right modified radical mastectomy in May 2009 for a multifocal/multicentric breast carcinoma, the major component being lobular with a minor ductal component, pathologically T2 N1, Stage IIB, grade 2, estrogen and progesterone receptor positive, HER2 negative, with a low MIB-1,  (2) treated adjuvantly with dose-dense doxorubicin/ cyclophosphamide x4 followed by weekly paclitaxel x12, completed October 2009,   (3) radiation completed on December 2009 at which point she started tamoxifen.  the goal is to continue on tamoxifen for a total of 10 years, until December 2019.  (4) severe lymphedema, right upper extremity      PLAN:  With regards to her breast cancer, Maryna appears to be doing very well. As noted above, she is having some increased problems with the lymphedema in the right upper extremity and would like a referral back to our lymphedema clinic for further evaluation, and possibly for wrapping.  With regards to her tamoxifen, she is tolerating it quite well, and I have refilled that for another year. Her next left mammogram is due in April of next year. We will continue to see her on an annual basis, with her next appointment scheduled in June 2016 for labs and physical exam. Lark has asked that we check her tumor marker on an annual basis, and that order has been entered as well.  The above plan was reviewed in detail with [name] Gibson, who voices both her understanding and agreement with our plan. She knows to call with any changes or problems prior to her next scheduled appointment.   Yousuf Ager Milda Smart PA-C     12/24/2013

## 2013-12-24 NOTE — Telephone Encounter (Signed)
, °

## 2013-12-31 ENCOUNTER — Other Ambulatory Visit: Payer: 59

## 2013-12-31 ENCOUNTER — Ambulatory Visit: Payer: 59 | Admitting: Physician Assistant

## 2014-01-05 ENCOUNTER — Other Ambulatory Visit: Payer: Self-pay | Admitting: Gynecology

## 2014-01-05 DIAGNOSIS — N83209 Unspecified ovarian cyst, unspecified side: Secondary | ICD-10-CM

## 2014-01-22 ENCOUNTER — Other Ambulatory Visit: Payer: 59

## 2014-01-22 ENCOUNTER — Ambulatory Visit: Payer: 59 | Admitting: Women's Health

## 2014-01-28 ENCOUNTER — Ambulatory Visit (INDEPENDENT_AMBULATORY_CARE_PROVIDER_SITE_OTHER): Payer: 59 | Admitting: Cardiology

## 2014-01-28 ENCOUNTER — Encounter: Payer: Self-pay | Admitting: Cardiology

## 2014-01-28 VITALS — BP 96/58 | HR 85 | Ht 66.0 in | Wt 257.6 lb

## 2014-01-28 DIAGNOSIS — R0789 Other chest pain: Secondary | ICD-10-CM

## 2014-01-28 DIAGNOSIS — I890 Lymphedema, not elsewhere classified: Secondary | ICD-10-CM

## 2014-01-28 DIAGNOSIS — Z853 Personal history of malignant neoplasm of breast: Secondary | ICD-10-CM

## 2014-01-28 NOTE — Patient Instructions (Signed)
The current medical regimen is effective;  continue present plan and medications.  Your physician has requested that you have an exercise tolerance test. For further information please visit [email or web address]. Please also follow instruction sheet, as given.  Further follow up will be based on these results 

## 2014-01-28 NOTE — Progress Notes (Signed)
[facility]. [address], [address] [place], [place]  [address] Phone: [phone] Fax:  [phone]  Date:  01/28/2014   ID:  [name] Gibson, DOB 09/04/1955, MRN [id number]  PCP:  Vidal Schwalbe, MD   History of Present Illness: [name] Gibson is a 58 y.o. female here for the evaluation of chest pain at the request of Dr. Dema Severin. She has significantly RUE lymphedema from right sided mastectomy. She had chest discomfort lasting a few hours duration intermittent, sometimes felt muscular skeletal, but sometimes she felt with exertion described as a tightness that seemed to be relieved with rest after exercise. Most the time, her discomfort is right of center of chest. At times her pain would subside however when adjusting her arm on her pillow. She has a mastectomy and significant right upper extremity lymphedema. She has had some shortness of breath with exertion. She thinks that this is from deconditioning.  Risk factors include hypertension, obesity. She does not have diabetes, no hyperlipidemia. Her father had a myocardial infarction in his 31s. Never smoked.   She has a 73 year old son and another child in college. She works with [employer] for [employer].   Wt Readings from Last 3 Encounters:  01/28/14 257 lb 9.6 oz (116.847 kg)  12/24/13 255 lb 14.4 oz (116.075 kg)  11/13/13 255 lb (115.667 kg)     Past Medical History  Diagnosis Date  . 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 Ovarian cyst   . Cancer     Lobular breast cancer -Right  . Pituitary microadenoma   . Hypertension   . Swelling of arm   . Complication of anesthesia     some nausea after second mastectomy surgery  . Cellulitis of arm, right 08/21/2013  . Psoriasis     Past Surgical History  Procedure Laterality Date  . Tonsillectomy    . Anal fistula repair    . Breast surgery      Right Mastectomy  . Cervical cone biopsy    . Cholecystectomy    . Colposcopy      Current  Outpatient Prescriptions  Medication Sig Dispense Refill  . Calcium Carbonate-Vitamin D (CALCIUM + D PO) Take 1 tablet by mouth 2 (two) times daily.       . Cholecalciferol (VITAMIN D PO) Take by mouth.      . escitalopram (LEXAPRO) 10 MG tablet Take 1 tablet (10 mg total) by mouth daily.  90 tablet  4  . hydrochlorothiazide (HYDRODIURIL) 25 MG tablet Take 25 mg by mouth daily.      Marland Kitchen lisinopril (PRINIVIL,ZESTRIL) 40 MG tablet Take 40 mg by mouth daily.      . mupirocin ointment (BACTROBAN) 2 % Place 1 application into the nose 2 (two) times daily. Use as needed for breaks in the skin to prevent recurrent cellulitis.  22 g  0  . potassium chloride SA (K-DUR,KLOR-CON) 20 MEQ tablet Take 20 mEq by mouth daily.      . tamoxifen (NOLVADEX) 20 MG tablet Take 1 tablet (20 mg total) by mouth daily.  90 tablet  3  . zolpidem (AMBIEN) 5 MG tablet Take 5 mg by mouth at bedtime as needed.       No current facility-administered medications for this visit.    Allergies:    Allergies  Allergen Reactions  . Sulfa Antibiotics Hives    Social History:  The patient  reports that she has  quit smoking. She has never used smokeless tobacco. She reports that she does not drink alcohol or use illicit drugs.   Family History  Problem Relation Age of Onset  . Hypertension Mother   . Melanoma Mother   . Diabetes Father   . Hypertension Father   . Heart disease Father   . Parkinsonism Father   . Breast cancer Cousin 61    passed away form disease  . COPD Brother   . Breast cancer Cousin     ROS:  Please see the history of present illness.   Denies any fevers, chills, orthopnea, PND, syncope, rash, bleeding, strokelike symptoms.   All other systems reviewed and negative.   PHYSICAL EXAM: VS:  BP 96/58  Pulse 85  Ht 5\' 6"  (1.676 m)  Wt 257 lb 9.6 oz (116.847 kg)  BMI 41.60 kg/m2 Well nourished, well developed, in no acute distress HEENT: normal, [place]/AT, EOMI Neck: no JVD, normal carotid upstroke, no  bruit Cardiac:  normal S1, S2; RRR; no murmur Lungs:  clear to auscultation bilaterally, no wheezing, rhonchi or rales Abd: soft, nontender, no hepatomegaly, no bruitsObese Ext: RUE significant lymphedema, 2+ distal pulses Skin: warm and dry GU: deferred Neuro: no focal abnormalities noted, AAO x 3  EKG:  01/28/14-sinus rhythm, 85 with no other abnormalities   LABS: LDL 57, Creat 0.8  ASSESSMENT AND PLAN:  1. Chest pain- based upon her history, this is likely related to her lymphedema/upper extremity inbalance that is causing some musculoskeletal discomfort. However, she does note a tightness during exercise and given her cardiac risk factors, I would like to proceed with exercise treadmill test. Her EKG is unremarkable. She admits that she is significantly deconditioned. She is motivated to start exercise program. She wants to make sure that it is safe to do so. 2. Morbid obesity-encouraged weight loss. She is motivated. 3. History of breast cancer/lymphedema-right upper extremity. She occasionally will have wraps performed and sleeve placed. 4. Will follow up with stress test.  Signed, Candee Furbish, MD [facility]  01/28/2014 11:32 AM

## 2014-02-04 ENCOUNTER — Ambulatory Visit: Payer: 59 | Attending: Family Medicine | Admitting: Physical Therapy

## 2014-02-04 DIAGNOSIS — I890 Lymphedema, not elsewhere classified: Secondary | ICD-10-CM | POA: Insufficient documentation

## 2014-02-04 DIAGNOSIS — Z9221 Personal history of antineoplastic chemotherapy: Secondary | ICD-10-CM | POA: Diagnosis not present

## 2014-02-04 DIAGNOSIS — Z923 Personal history of irradiation: Secondary | ICD-10-CM | POA: Diagnosis not present

## 2014-02-04 DIAGNOSIS — L408 Other psoriasis: Secondary | ICD-10-CM | POA: Insufficient documentation

## 2014-02-04 DIAGNOSIS — I10 Essential (primary) hypertension: Secondary | ICD-10-CM | POA: Insufficient documentation

## 2014-02-04 DIAGNOSIS — IMO0001 Reserved for inherently not codable concepts without codable children: Secondary | ICD-10-CM | POA: Insufficient documentation

## 2014-02-04 DIAGNOSIS — Z9010 Acquired absence of unspecified breast and nipple: Secondary | ICD-10-CM | POA: Insufficient documentation

## 2014-02-06 ENCOUNTER — Ambulatory Visit: Payer: 59 | Admitting: Physical Therapy

## 2014-02-06 DIAGNOSIS — IMO0001 Reserved for inherently not codable concepts without codable children: Secondary | ICD-10-CM | POA: Diagnosis not present

## 2014-02-09 ENCOUNTER — Ambulatory Visit: Payer: 59 | Admitting: Physical Therapy

## 2014-02-09 DIAGNOSIS — IMO0001 Reserved for inherently not codable concepts without codable children: Secondary | ICD-10-CM | POA: Diagnosis not present

## 2014-02-11 ENCOUNTER — Ambulatory Visit: Payer: 59 | Admitting: Physical Therapy

## 2014-02-11 DIAGNOSIS — IMO0001 Reserved for inherently not codable concepts without codable children: Secondary | ICD-10-CM | POA: Diagnosis not present

## 2014-02-12 ENCOUNTER — Other Ambulatory Visit: Payer: 59

## 2014-02-12 ENCOUNTER — Ambulatory Visit: Payer: 59 | Admitting: Women's Health

## 2014-02-13 ENCOUNTER — Ambulatory Visit: Payer: 59 | Admitting: Physical Therapy

## 2014-02-13 DIAGNOSIS — IMO0001 Reserved for inherently not codable concepts without codable children: Secondary | ICD-10-CM | POA: Diagnosis not present

## 2014-02-16 ENCOUNTER — Ambulatory Visit: Payer: 59 | Admitting: Physical Therapy

## 2014-02-16 DIAGNOSIS — IMO0001 Reserved for inherently not codable concepts without codable children: Secondary | ICD-10-CM | POA: Diagnosis not present

## 2014-02-18 ENCOUNTER — Ambulatory Visit: Payer: 59 | Admitting: Physical Therapy

## 2014-02-18 DIAGNOSIS — IMO0001 Reserved for inherently not codable concepts without codable children: Secondary | ICD-10-CM | POA: Diagnosis not present

## 2014-02-20 ENCOUNTER — Ambulatory Visit: Payer: 59 | Admitting: Physical Therapy

## 2014-02-20 DIAGNOSIS — IMO0001 Reserved for inherently not codable concepts without codable children: Secondary | ICD-10-CM | POA: Diagnosis not present

## 2014-02-23 ENCOUNTER — Ambulatory Visit: Payer: 59 | Attending: Family Medicine | Admitting: Physical Therapy

## 2014-02-23 DIAGNOSIS — Z5189 Encounter for other specified aftercare: Secondary | ICD-10-CM | POA: Insufficient documentation

## 2014-02-23 DIAGNOSIS — D069 Carcinoma in situ of cervix, unspecified: Secondary | ICD-10-CM | POA: Insufficient documentation

## 2014-02-23 DIAGNOSIS — Z853 Personal history of malignant neoplasm of breast: Secondary | ICD-10-CM | POA: Diagnosis not present

## 2014-02-25 ENCOUNTER — Ambulatory Visit: Payer: 59 | Admitting: Physical Therapy

## 2014-02-25 DIAGNOSIS — Z5189 Encounter for other specified aftercare: Secondary | ICD-10-CM | POA: Diagnosis not present

## 2014-02-27 ENCOUNTER — Ambulatory Visit: Payer: 59 | Admitting: Physical Therapy

## 2014-02-27 DIAGNOSIS — Z5189 Encounter for other specified aftercare: Secondary | ICD-10-CM | POA: Diagnosis not present

## 2014-03-02 ENCOUNTER — Ambulatory Visit: Payer: 59 | Admitting: Physical Therapy

## 2014-03-02 ENCOUNTER — Ambulatory Visit (INDEPENDENT_AMBULATORY_CARE_PROVIDER_SITE_OTHER): Payer: 59 | Admitting: Physician Assistant

## 2014-03-02 DIAGNOSIS — Z5189 Encounter for other specified aftercare: Secondary | ICD-10-CM | POA: Diagnosis not present

## 2014-03-02 DIAGNOSIS — R0789 Other chest pain: Secondary | ICD-10-CM

## 2014-03-02 NOTE — Progress Notes (Signed)
Exercise Treadmill Test  Pre-Exercise Testing Evaluation Rhythm: normal sinus  Rate: 82     Test  Exercise Tolerance Test Ordering MD: Candee Furbish, MD  Interpreting MD: Richardson Dopp, PA-C  Unique Test No: 1  Treadmill:  1  Indication for ETT: chest pain - rule out ischemia  Contraindication to ETT: No   Stress Modality: exercise - treadmill  Cardiac Imaging Performed: non   Protocol: standard Bruce - maximal  Max BP:  220/77  Max MPHR (bpm):  165 85% MPR (bpm):  140  MPHR obtained (bpm):  173 % MPHR obtained:  105  Reached 85% MPHR (min:sec):  1:53 Total Exercise Time (min-sec):  5:00  Workload in METS:  7.0 Borg Scale: 15  Reason ETT Terminated:  desired heart rate attained    ST Segment Analysis At Rest: normal ST segments - no evidence of significant ST depression With Exercise: non-specific ST changes  Other Information Arrhythmia:  No Angina during ETT:  absent (0) Quality of ETT:  diagnostic  ETT Interpretation:  normal - no evidence of ischemia by ST analysis  Comments: Fair exercise capacity. No chest pain. Normal BP response to exercise. Non-specific ST changes.  No significant ST changes to suggest ischemia.   Recommendations: F/u with Dr. Candee Furbish as directed. Signed,  Richardson Dopp, PA-C   03/02/2014 11:32 AM

## 2014-03-03 NOTE — Progress Notes (Signed)
Reassuring exercise treadmill test. [name] Furbish, MD

## 2014-03-04 ENCOUNTER — Ambulatory Visit: Payer: 59 | Admitting: Physical Therapy

## 2014-03-04 DIAGNOSIS — Z5189 Encounter for other specified aftercare: Secondary | ICD-10-CM | POA: Diagnosis not present

## 2014-03-05 NOTE — Progress Notes (Signed)
Left message of results and to call back if any questions or concerns

## 2014-03-06 ENCOUNTER — Ambulatory Visit: Payer: 59 | Admitting: Physical Therapy

## 2014-03-06 DIAGNOSIS — Z5189 Encounter for other specified aftercare: Secondary | ICD-10-CM | POA: Diagnosis not present

## 2014-03-09 ENCOUNTER — Ambulatory Visit: Payer: 59 | Admitting: Physical Therapy

## 2014-03-09 ENCOUNTER — Encounter: Payer: 59 | Admitting: Physical Therapy

## 2014-03-09 DIAGNOSIS — Z5189 Encounter for other specified aftercare: Secondary | ICD-10-CM | POA: Diagnosis not present

## 2014-03-11 ENCOUNTER — Ambulatory Visit: Payer: 59 | Admitting: Physical Therapy

## 2014-03-11 DIAGNOSIS — Z5189 Encounter for other specified aftercare: Secondary | ICD-10-CM | POA: Diagnosis not present

## 2014-03-13 ENCOUNTER — Ambulatory Visit: Payer: 59 | Admitting: Physical Therapy

## 2014-03-13 DIAGNOSIS — Z5189 Encounter for other specified aftercare: Secondary | ICD-10-CM | POA: Diagnosis not present

## 2014-03-16 ENCOUNTER — Ambulatory Visit: Payer: 59 | Admitting: Physical Therapy

## 2014-03-16 DIAGNOSIS — Z5189 Encounter for other specified aftercare: Secondary | ICD-10-CM | POA: Diagnosis not present

## 2014-03-18 ENCOUNTER — Ambulatory Visit: Payer: 59 | Admitting: Physical Therapy

## 2014-03-18 DIAGNOSIS — Z5189 Encounter for other specified aftercare: Secondary | ICD-10-CM | POA: Diagnosis not present

## 2014-03-20 ENCOUNTER — Ambulatory Visit: Payer: 59 | Admitting: Physical Therapy

## 2014-03-20 DIAGNOSIS — Z5189 Encounter for other specified aftercare: Secondary | ICD-10-CM | POA: Diagnosis not present

## 2014-03-23 ENCOUNTER — Ambulatory Visit: Payer: 59 | Admitting: Physical Therapy

## 2014-03-23 DIAGNOSIS — Z5189 Encounter for other specified aftercare: Secondary | ICD-10-CM | POA: Diagnosis not present

## 2014-03-24 ENCOUNTER — Ambulatory Visit: Payer: 59 | Attending: Family Medicine | Admitting: Physical Therapy

## 2014-03-24 DIAGNOSIS — Z5189 Encounter for other specified aftercare: Secondary | ICD-10-CM | POA: Diagnosis not present

## 2014-03-24 DIAGNOSIS — D069 Carcinoma in situ of cervix, unspecified: Secondary | ICD-10-CM | POA: Diagnosis not present

## 2014-03-24 DIAGNOSIS — Z853 Personal history of malignant neoplasm of breast: Secondary | ICD-10-CM | POA: Diagnosis not present

## 2014-03-25 ENCOUNTER — Ambulatory Visit: Payer: 59 | Admitting: Physical Therapy

## 2014-03-25 DIAGNOSIS — Z5189 Encounter for other specified aftercare: Secondary | ICD-10-CM | POA: Diagnosis not present

## 2014-03-26 ENCOUNTER — Encounter: Payer: 59 | Admitting: Physical Therapy

## 2014-03-27 ENCOUNTER — Ambulatory Visit: Payer: 59 | Admitting: Physical Therapy

## 2014-03-27 DIAGNOSIS — Z5189 Encounter for other specified aftercare: Secondary | ICD-10-CM | POA: Diagnosis not present

## 2014-03-31 ENCOUNTER — Encounter: Payer: 59 | Admitting: Physical Therapy

## 2014-04-01 ENCOUNTER — Ambulatory Visit: Payer: 59 | Admitting: Physical Therapy

## 2014-04-01 DIAGNOSIS — Z5189 Encounter for other specified aftercare: Secondary | ICD-10-CM | POA: Diagnosis not present

## 2014-04-03 ENCOUNTER — Ambulatory Visit: Payer: 59 | Admitting: Physical Therapy

## 2014-04-03 DIAGNOSIS — Z5189 Encounter for other specified aftercare: Secondary | ICD-10-CM | POA: Diagnosis not present

## 2014-04-06 ENCOUNTER — Ambulatory Visit: Payer: 59 | Admitting: Physical Therapy

## 2014-04-06 DIAGNOSIS — Z5189 Encounter for other specified aftercare: Secondary | ICD-10-CM | POA: Diagnosis not present

## 2014-04-08 ENCOUNTER — Ambulatory Visit: Payer: 59 | Admitting: Physical Therapy

## 2014-04-08 DIAGNOSIS — Z5189 Encounter for other specified aftercare: Secondary | ICD-10-CM | POA: Diagnosis not present

## 2014-04-10 ENCOUNTER — Ambulatory Visit: Payer: 59 | Admitting: Physical Therapy

## 2014-04-10 DIAGNOSIS — Z5189 Encounter for other specified aftercare: Secondary | ICD-10-CM | POA: Diagnosis not present

## 2014-04-13 ENCOUNTER — Ambulatory Visit: Payer: 59 | Admitting: Physical Therapy

## 2014-04-13 DIAGNOSIS — Z5189 Encounter for other specified aftercare: Secondary | ICD-10-CM | POA: Diagnosis not present

## 2014-04-15 ENCOUNTER — Ambulatory Visit: Payer: 59 | Admitting: Physical Therapy

## 2014-04-15 DIAGNOSIS — Z5189 Encounter for other specified aftercare: Secondary | ICD-10-CM | POA: Diagnosis not present

## 2014-04-17 ENCOUNTER — Ambulatory Visit: Payer: 59 | Admitting: Physical Therapy

## 2014-04-17 DIAGNOSIS — Z5189 Encounter for other specified aftercare: Secondary | ICD-10-CM | POA: Diagnosis not present

## 2014-04-20 ENCOUNTER — Ambulatory Visit: Payer: 59 | Admitting: Physical Therapy

## 2014-04-20 DIAGNOSIS — Z5189 Encounter for other specified aftercare: Secondary | ICD-10-CM | POA: Diagnosis not present

## 2014-04-22 ENCOUNTER — Ambulatory Visit: Payer: 59 | Admitting: Physical Therapy

## 2014-04-22 DIAGNOSIS — Z5189 Encounter for other specified aftercare: Secondary | ICD-10-CM | POA: Diagnosis not present

## 2014-04-24 ENCOUNTER — Ambulatory Visit: Payer: 59 | Attending: Family Medicine | Admitting: Physical Therapy

## 2014-04-24 DIAGNOSIS — Z853 Personal history of malignant neoplasm of breast: Secondary | ICD-10-CM | POA: Diagnosis not present

## 2014-04-24 DIAGNOSIS — D069 Carcinoma in situ of cervix, unspecified: Secondary | ICD-10-CM | POA: Diagnosis present

## 2014-04-27 ENCOUNTER — Ambulatory Visit: Payer: 59 | Admitting: Physical Therapy

## 2014-04-27 DIAGNOSIS — D069 Carcinoma in situ of cervix, unspecified: Secondary | ICD-10-CM | POA: Diagnosis not present

## 2014-04-29 ENCOUNTER — Ambulatory Visit: Payer: 59 | Admitting: Physical Therapy

## 2014-04-29 DIAGNOSIS — D069 Carcinoma in situ of cervix, unspecified: Secondary | ICD-10-CM | POA: Diagnosis not present

## 2014-05-01 ENCOUNTER — Ambulatory Visit: Payer: 59 | Admitting: Physical Therapy

## 2014-05-01 DIAGNOSIS — D069 Carcinoma in situ of cervix, unspecified: Secondary | ICD-10-CM | POA: Diagnosis not present

## 2014-05-04 ENCOUNTER — Ambulatory Visit: Payer: 59 | Admitting: Physical Therapy

## 2014-05-04 DIAGNOSIS — D069 Carcinoma in situ of cervix, unspecified: Secondary | ICD-10-CM | POA: Diagnosis not present

## 2014-05-06 ENCOUNTER — Ambulatory Visit: Payer: 59 | Admitting: Physical Therapy

## 2014-05-06 DIAGNOSIS — D069 Carcinoma in situ of cervix, unspecified: Secondary | ICD-10-CM | POA: Diagnosis not present

## 2014-05-08 ENCOUNTER — Other Ambulatory Visit: Payer: Self-pay

## 2014-05-08 ENCOUNTER — Ambulatory Visit: Payer: 59 | Admitting: Physical Therapy

## 2014-05-08 DIAGNOSIS — D069 Carcinoma in situ of cervix, unspecified: Secondary | ICD-10-CM | POA: Diagnosis not present

## 2014-05-11 ENCOUNTER — Ambulatory Visit: Payer: 59

## 2014-05-13 ENCOUNTER — Ambulatory Visit: Payer: 59

## 2014-05-13 DIAGNOSIS — D069 Carcinoma in situ of cervix, unspecified: Secondary | ICD-10-CM | POA: Diagnosis not present

## 2014-05-15 ENCOUNTER — Ambulatory Visit: Payer: 59 | Admitting: Physical Therapy

## 2014-05-18 ENCOUNTER — Ambulatory Visit: Payer: 59 | Admitting: Physical Therapy

## 2014-05-20 ENCOUNTER — Ambulatory Visit: Payer: 59 | Admitting: Physical Therapy

## 2014-05-20 DIAGNOSIS — D069 Carcinoma in situ of cervix, unspecified: Secondary | ICD-10-CM | POA: Diagnosis not present

## 2014-05-22 ENCOUNTER — Encounter: Payer: 59 | Admitting: Physical Therapy

## 2014-05-25 ENCOUNTER — Encounter: Payer: Self-pay | Admitting: Cardiology

## 2014-05-27 ENCOUNTER — Ambulatory Visit: Payer: 59 | Attending: Family Medicine

## 2014-05-27 DIAGNOSIS — C50911 Malignant neoplasm of unspecified site of right female breast: Secondary | ICD-10-CM

## 2014-05-27 DIAGNOSIS — D069 Carcinoma in situ of cervix, unspecified: Secondary | ICD-10-CM | POA: Diagnosis present

## 2014-05-27 DIAGNOSIS — I890 Lymphedema, not elsewhere classified: Secondary | ICD-10-CM

## 2014-05-27 DIAGNOSIS — Z853 Personal history of malignant neoplasm of breast: Secondary | ICD-10-CM | POA: Insufficient documentation

## 2014-05-27 NOTE — Therapy (Signed)
Physical Therapy Treatment  Patient Details  Name: [name] Gibson MRN: [id number] Date of Birth: 12/28/1955  Encounter Date: 05/27/2014      PT End of Session - 05/27/14 0907    Visit Number [id number]   PT Start Time 0802   PT Stop Time 4944   PT Time Calculation (min) 55 min      Past Medical History  Diagnosis Date  . 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 Ovarian cyst   . Cancer     Lobular breast cancer -Right  . Pituitary microadenoma   . Hypertension   . Swelling of arm   . Complication of anesthesia     some nausea after second mastectomy surgery  . Cellulitis of arm, right 08/21/2013  . Psoriasis     Past Surgical History  Procedure Laterality Date  . Tonsillectomy    . Anal fistula repair    . Breast surgery      Right Mastectomy  . Cervical cone biopsy    . Cholecystectomy    . Colposcopy      There were no vitals taken for this visit.  Visit Diagnosis:  Lymphedema      Subjective Assessment - 05/27/14 0806    Symptoms Doing the Flexitouch about every other day, and will self massage on the days I dont do the pump.   Currently in Pain? No/denies     Manual lymph drainage in supine as follows: short neck, left axillary nodes, right inguinal nodes, superficial and deep abdominals; anterior inter-axillary anastamoses, right axillo-inguinal anastamoses. Right upper extremity from fingers and dorsal hand to lateral shoulder redirecting along pathways, reviewing with patient throughout to answer her questions regarding correct sequence and direction of pumps.  Rt UE circumference measurements taken, see below.         Education - 05/27/14 0906    Education provided Yes   Education Details Reviewed diaphragmatic breathing and deep abdominal manual lymph drainage.   Education Details Patient   Methods Explanation;Demonstration;Handout   Comprehension Verbalized understanding;Returned demonstration              Plan - 05/27/14 0908     Clinical Impression Statement Patient is doing well thus far with maintaining circumference reductions gained, and even had some further reductions today, see circumference meausrements. Patient does still present with lymphedema and continues to need minimal verbal cuing for correct technique of self manual lymph drainage.   PT Frequency Min 1X/week   PT Duration 8 weeks   PT Plan Continue 1x/week with manual lymph drainage per plan to continue to monitor patients swelling and for signs of infection.         Problem List Patient Active Problem List   Diagnosis Date Noted  . Lymphedema of arm 12/24/2013  . History of breast cancer 12/24/2013  . Psoriasis 08/23/2013  . Chronic acquired lymphedema 08/21/2013  . Breast cancer 12/26/2011  . Hypertension   . Swelling of arm   . Carcinoma in situ of cervix   . Ovarian cyst   . Cancer   . Pituitary microadenoma             LYMPHEDEMA/ONCOLOGY QUESTIONNAIRE - 05/27/14 0800    15 cm Proximal to Olecranon Process 41.3   Olecranon Process 33.8   15 cm Proximal to Ulnar Styloid Process 34.4   Just Proximal to Ulnar Styloid Process 23.1   Across Hand at [employer] 22.6   At [place] of 2nd  Digit 7.6                                    Short Term Clinic Goals - 05/27/14 0912    CC Short Term Goal  #1   Title Patient will be able to report overall pain decreased >/= 25% to tolerate daily tasks with less pain.   Time 4   Period Weeks   Status Achieved   CC Short Term Goal  #2   Title Patient will be able to reduce edema by 3 cm at 15 cm proximal to Rt ulnar styloid.   Time 4   Period Weeks   Status Achieved          Long Term Clinic Goals - 05/27/14 0913    CC Long Term Goal  #1   Title Patient will be able to verbalize good understanding of Maintenance Phase of treatment including manual lymph drainage, use of compression and lymphedema risk reduction practices.   Time 8   Period Weeks    Status On-going   CC Long Term Goal  #2   Title Patient will be able to reduce edema by 6 cm at 15 cm to Rt proximal ulnar styloid.   Time 8   Period Weeks   Status Achieved   CC Long Term Goal  #3   Title Patient will be able to be independent in a home exercise program.   Time 8   Period Weeks   Status Achieved   CC Long Term Goal  #4   Title Patient will be able to report overall pain decreased>/= 50% to tolerate daily tasks with less pain.   Time 8   Period Weeks   Status Achieved         Collie Siad, PTA 05/27/2014 9:20 AM Otelia Limes 05/27/2014, 9:20 AM

## 2014-05-27 NOTE — Patient Instructions (Signed)
Diaphragmatic - Supine   Inhale through nose making navel move out toward hands. Exhale through puckered lips, hands follow navel in a spiral like motion. Repeat _5__ times. Do before each session. Can also do the "M" starting and finishing at navel, going to Lt side first.  Copyright  VHI. All rights reserved.

## 2014-06-03 ENCOUNTER — Ambulatory Visit: Payer: 59 | Admitting: Physical Therapy

## 2014-06-03 DIAGNOSIS — C50911 Malignant neoplasm of unspecified site of right female breast: Secondary | ICD-10-CM

## 2014-06-03 DIAGNOSIS — D069 Carcinoma in situ of cervix, unspecified: Secondary | ICD-10-CM | POA: Diagnosis not present

## 2014-06-03 DIAGNOSIS — I890 Lymphedema, not elsewhere classified: Secondary | ICD-10-CM

## 2014-06-03 NOTE — Therapy (Signed)
Physical Therapy Treatment  Patient Details  Name: [name] Gibson MRN: [id number] Date of Birth: [birth year]  Encounter Date: 06/03/2014    Past Medical History  Diagnosis Date  . 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 Ovarian cyst   . Cancer     Lobular breast cancer -Right  . Pituitary microadenoma   . Hypertension   . Swelling of arm   . Complication of anesthesia     some nausea after second mastectomy surgery  . Cellulitis of arm, right 08/21/2013  . Psoriasis     Past Surgical History  Procedure Laterality Date  . Tonsillectomy    . Anal fistula repair    . Breast surgery      Right Mastectomy  . Cervical cone biopsy    . Cholecystectomy    . Colposcopy      There were no vitals taken for this visit.  Visit Diagnosis:  No diagnosis found.      Subjective Assessment - 06/03/14 0802    Symptoms good!  patient is still using the Flexitouch and wearing compression garments.  Limiting factor with flexitouch is that is takes 68 minutes to go through the whole program   Currently in Pain? No/denies            [facility] Adult PT Treatment/Exercise - 06/03/14 0001    Neck Exercises: Seated   Cervical Rotation Both;Other reps (comment)  3 reps, slowly   Lateral Flexion Both;Other reps (comment)  3 reps slowly   Shoulder Rolls Backwards;5 reps;Other (comment)  alternating   Shoulder Flexion Right  3 reps slowly   Other Seated Exercise neural glide   with and without neck extension 3reps slowly   Other Seated Exercise --  both arms in neural glide position with slow cervical rotati     Manual lymph drainage in supine as follows: short neck, left axillary nodes, right inguinal nodes, superficial and deep abdominals; anterior inter-axillary anastamoses, right axillo-inguinal anastamoses. Right upper extremity from fingers and dorsal hand to lateral shoulder redirecting along pathways. Reviewed progress and goals with patient for renewal. She would  like to continue physical therapy to assure that arm circumferences are not increasing and to learn and follow through with exercises for lymphatic remediation and progressive increasing resistance for strength training   Problem List Patient Active Problem List   Diagnosis Date Noted  . Lymphedema of arm 12/24/2013  . History of breast cancer 12/24/2013  . Psoriasis 08/23/2013  . Chronic acquired lymphedema 08/21/2013  . Breast cancer 12/26/2011  . Hypertension   . Swelling of arm   . Carcinoma in situ of cervix   . Ovarian cyst   . Cancer   . Pituitary microadenoma          LYMPHEDEMA/ONCOLOGY QUESTIONNAIRE - 06/03/14 0804    Right Upper Extremity Lymphedema   At [place] of 2nd Digit (p) 7.2   Across Hand at [employer] (p) 22.5   Just Proximal to Ulnar Styloid Process (p) 21.5   15 cm Proximal to Ulnar Styloid Process (p) 35.6   Olecranon Process (p) 33.9    all measurements above are in centimeters         Short Term Clinic Goals - 06/03/14 0912    CC Short Term Goal  #1   Title Patient will be able to report overall pain decreased >/= 25% to tolerate daily tasks with less pain.   Time 4   Period Weeks  Status Achieved   CC Short Term Goal  #2   Title Patient will be able to reduce edema by 3 cm at 15 cm proximal to Rt ulnar styloid.   Time 4   Period Weeks   Status Achieved          Long Term Clinic Goals - 06/03/14 [phone]    CC Long Term Goal  #5   Title patient will have no more than 1 cm at any circumferential measurement   Time 12   Period Weeks   Status New   CC Long Term Goal  #6   Title patient will report that she is doing lymphatic remedial exercise daily   Time 12   Period Weeks   Status New   CC Long Term Goal  #7   Title be able to do Strength ABC program for porgressively resistance exercise program 2x/week independently and verbailize how she will progress it   Time 12   Period Weeks   Status New   Additional Goals   Additional  Goals Yes          [name] Gibson 06/03/2014, 9:34 AM

## 2014-06-10 ENCOUNTER — Ambulatory Visit: Payer: 59

## 2014-06-10 DIAGNOSIS — I890 Lymphedema, not elsewhere classified: Secondary | ICD-10-CM

## 2014-06-10 DIAGNOSIS — C50911 Malignant neoplasm of unspecified site of right female breast: Secondary | ICD-10-CM

## 2014-06-10 DIAGNOSIS — D069 Carcinoma in situ of cervix, unspecified: Secondary | ICD-10-CM | POA: Diagnosis not present

## 2014-06-10 NOTE — Therapy (Signed)
Physical Therapy Treatment  Patient Details  Name: [name] Gibson MRN: [id number] Date of Birth: 1955-09-22  Encounter Date: 06/10/2014      PT End of Session - 06/10/14 0851    Visit Number 51   PT Start Time 0801   PT Stop Time 0845   PT Time Calculation (min) 44 min      Past Medical History  Diagnosis Date  . 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 Ovarian cyst   . Cancer     Lobular breast cancer -Right  . Pituitary microadenoma   . Hypertension   . Swelling of arm   . Complication of anesthesia     some nausea after second mastectomy surgery  . Cellulitis of arm, right 08/21/2013  . Psoriasis     Past Surgical History  Procedure Laterality Date  . Tonsillectomy    . Anal fistula repair    . Breast surgery      Right Mastectomy  . Cervical cone biopsy    . Cholecystectomy    . Colposcopy      There were no vitals taken for this visit.  Visit Diagnosis:  Lymphedema  Breast cancer, right      Subjective Assessment - 06/10/14 0842    Symptoms My anterior elbow has been feeling a little overstretched with having to keep it straight all the time in pump and nighttime garment. But overall still doing good.   Currently in Pain? No/denies       Manual lymph drainage in supine as follows: short neck, left axillary nodes, right inguinal nodes, superficial and deep abdominals; anterior inter-axillary anastamoses, right axillo-inguinal anastamoses. Right upper extremity from fingers and dorsal hand to lateral shoulder redirecting along pathways.   Circumference measurements taken, see below.            Plan - 06/10/14 0851    Clinical Impression Statement Patient is doing well with being compliant with Maintenance Phase of treament. Is now esager to begin strengthening at next visit. Rt arm tissue is soft today and patient continues to make good progress with measuremeents as well.    PT Next Visit Plan Progress home exercise program for  strengthening. Continue to monitor Rt UE for increased swelling or signs of infection.        Problem List Patient Active Problem List   Diagnosis Date Noted  . Lymphedema of arm 12/24/2013  . History of breast cancer 12/24/2013  . Psoriasis 08/23/2013  . Chronic acquired lymphedema 08/21/2013  . Breast cancer 12/26/2011  . Hypertension   . Swelling of arm   . Carcinoma in situ of cervix   . Ovarian cyst   . Cancer   . Pituitary microadenoma             LYMPHEDEMA/ONCOLOGY QUESTIONNAIRE - 06/10/14 0838    Right Upper Extremity Lymphedema   At [facility] of 2nd Digit 7.3 cm   Across Hand at [employer] 22.5 cm   Just Proximal to Ulnar Styloid Process 22.3 cm   15 cm Proximal to Ulnar Styloid Process 34.1 cm   Olecranon Process 33.6 cm   15 cm Proximal to Olecranon Process 41.4 cm                                          Otelia Limes, PTA 06/10/2014, 8:54 AM

## 2014-06-15 ENCOUNTER — Ambulatory Visit: Payer: 59 | Admitting: Physical Therapy

## 2014-06-17 ENCOUNTER — Encounter: Payer: 59 | Admitting: Physical Therapy

## 2014-06-23 ENCOUNTER — Ambulatory Visit: Payer: 59 | Attending: Family Medicine | Admitting: Physical Therapy

## 2014-06-23 DIAGNOSIS — Z853 Personal history of malignant neoplasm of breast: Secondary | ICD-10-CM | POA: Diagnosis not present

## 2014-06-23 DIAGNOSIS — C50911 Malignant neoplasm of unspecified site of right female breast: Secondary | ICD-10-CM

## 2014-06-23 DIAGNOSIS — D069 Carcinoma in situ of cervix, unspecified: Secondary | ICD-10-CM | POA: Diagnosis not present

## 2014-06-23 DIAGNOSIS — I890 Lymphedema, not elsewhere classified: Secondary | ICD-10-CM

## 2014-06-23 NOTE — Therapy (Signed)
[place], [place], [address] Phone: [phone]   Fax:  [phone]  Physical Therapy Treatment  Patient Details  Name: [name] Gibson MRN: [id number] Date of Birth: 08/02/1955  Encounter Date: 06/23/2014      PT End of Session - 06/23/14 1741    Visit Number 44   PT Start Time 14   PT Stop Time 7741   PT Time Calculation (min) 48 min      Past Medical History  Diagnosis Date  . 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 Ovarian cyst   . Cancer     Lobular breast cancer -Right  . Pituitary microadenoma   . Hypertension   . Swelling of arm   . Complication of anesthesia     some nausea after second mastectomy surgery  . Cellulitis of arm, right 08/21/2013  . Psoriasis     Past Surgical History  Procedure Laterality Date  . Tonsillectomy    . Anal fistula repair    . Breast surgery      Right Mastectomy  . Cervical cone biopsy    . Cholecystectomy    . Colposcopy      There were no vitals taken for this visit.  Visit Diagnosis:  Lymphedema  Breast cancer, right      Subjective Assessment - 06/23/14 1725    Symptoms Pt reports she is here for exercise instruction this afternoon   Currently in Pain? No/denies            [facility] Adult PT Treatment/Exercise - 06/23/14 1731    Knee/Hip Exercises: Stretches   Active Hamstring Stretch 1 rep   Quad Stretch 1 rep  side sitting in armless chair   Piriformis Stretch 1 rep   Gastroc Stretch 1 rep   Shoulder Exercises: Supine   Other Supine Exercises chest press 2 sets of 10 with 2 pounds   Shoulder Exercises: Standing   Row AROM;10 reps;Weights  2 pounds          PT Education - 06/23/14 1739    Education provided Yes   Education Details strength ABC program and log    Person(s) Educated Patient   Methods Explanation;Demonstration;Handout;Tactile cues;Verbal cues   Comprehension Need further instruction               Plan - 06/23/14 1742    Clinical Impression Statement Pt did well with intital exercise session.  She plans to return to have arm monitored and remeasuered next week and exercise on her own. She will return in 2 weeks to complete the exercise instruction.   PT Next Visit Plan monitor and remeasure arm with manual lymph drainage as needed           Long Term Clinic Goals - 06/23/14 1744    CC Long Term Goal  #5   Title patient will have no more than 1 cm at any circumferential measurement   Time 12   Period Weeks   Status On-going   CC Long Term Goal  #6   Title patient will report that she is doing lymphatic remedial exercise daily   Time 12   Period Weeks   Status On-going   CC Long Term Goal  #7   Title be able to do Strength ABC program for porgressively resistance exercise program 2x/week independently and verbailize how she will progress it   Time 12   Period Weeks   Status On-going  Problem List Patient Active Problem List   Diagnosis Date Noted  . Lymphedema of arm 12/24/2013  . History of breast cancer 12/24/2013  . Psoriasis 08/23/2013  . Chronic acquired lymphedema 08/21/2013  . Breast cancer 12/26/2011  . Hypertension   . Swelling of arm   . Carcinoma in situ of cervix   . Ovarian cyst   . Cancer   . Pituitary microadenoma    Donato Heinz. Owens Shark, PT 06/23/2014, 5:52 PM

## 2014-06-23 NOTE — Patient Instructions (Signed)
Began instruction in strength ABC program: instruction in where to get online lymphedema education session on [email or web address], introduced concepts of lymphatic load and exercise progression.  Instructed in use of exercise log and patient will do exercises that were instructed 2 times a week until she returns.

## 2014-06-30 ENCOUNTER — Ambulatory Visit: Payer: 59

## 2014-06-30 DIAGNOSIS — I890 Lymphedema, not elsewhere classified: Secondary | ICD-10-CM

## 2014-06-30 DIAGNOSIS — D069 Carcinoma in situ of cervix, unspecified: Secondary | ICD-10-CM | POA: Diagnosis not present

## 2014-06-30 DIAGNOSIS — C50911 Malignant neoplasm of unspecified site of right female breast: Secondary | ICD-10-CM

## 2014-06-30 NOTE — Therapy (Signed)
[place], [place], [address] Phone: [phone]   Fax:  [phone]  Physical Therapy Treatment  Patient Details  Name: [name] Gibson MRN: [id number] Date of Birth: 1955-11-10  Encounter Date: 06/30/2014      PT End of Session - 06/30/14 0812    Visit Number 41   PT Start Time 0803   PT Stop Time 0845   PT Time Calculation (min) 42 min      Past Medical History  Diagnosis Date  . 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 Ovarian cyst   . Cancer     Lobular breast cancer -Right  . Pituitary microadenoma   . Hypertension   . Swelling of arm   . Complication of anesthesia     some nausea after second mastectomy surgery  . Cellulitis of arm, right 08/21/2013  . Psoriasis     Past Surgical History  Procedure Laterality Date  . Tonsillectomy    . Anal fistula repair    . Breast surgery      Right Mastectomy  . Cervical cone biopsy    . Cholecystectomy    . Colposcopy      There were no vitals taken for this visit.  Visit Diagnosis:  Lymphedema  Breast cancer, right      Subjective Assessment - 06/30/14 0805    Symptoms Have been wearing only the one sleeve, interested to see if my arm is doing okay.             [facility] Adult PT Treatment/Exercise - 06/30/14 0001    Manual Therapy   Manual Lymphatic Drainage (MLD) In supine: Short neck, Lt axilla and Rt inguinal nodes, superficial and deep abdominals, anterior inter-axillary and Rt axillo-ingional anastomosis, and Rt UE from dorsal hand to lateral shoulder                Plan - 06/30/14 0851    Clinical Impression Statement PT had slight increases with some circumferential measurements today. Has not been consisitent with HEP, per her reports.   Pt will benefit from skilled therapeutic intervention in order to improve on the following deficits Increased edema;Decreased activity tolerance;Decreased endurance   PT Next  Visit Plan monitor and remeasure arm with manual lymph drainage as needed. Pt going to make an appointment to get measured for a new sleeve.   PT Plan Continue 1x/week with manual lymph drainage per plan to continue to monitor patients swelling and for signs of infection.               LYMPHEDEMA/ONCOLOGY QUESTIONNAIRE - 06/30/14 0806    Right Upper Extremity Lymphedema   At [facility] of 2nd Digit 7.4   Across Hand at [employer] 22.7   Just Proximal to Ulnar Styloid Process 22.4   15 cm Proximal to Ulnar Styloid Process 34.5   Olecranon Process 34   15 cm Proximal to Olecranon Process 42                        Long Term Clinic Goals - 06/30/14 0858    CC Long Term Goal  #5   Title patient will have no more than 1 cm at any circumferential measurement   Time 12   Period Weeks   Status On-going   CC Long Term Goal  #6   Title patient will report that she is doing lymphatic remedial exercise daily  Time 12   Period Weeks   Status On-going   CC Long Term Goal  #7   Title be able to do Strength ABC program for porgressively resistance exercise program 2x/week independently and verbailize how she will progress it   Time 12   Period Weeks   Status On-going         Problem List Patient Active Problem List   Diagnosis Date Noted  . Lymphedema of arm 12/24/2013  . History of breast cancer 12/24/2013  . Psoriasis 08/23/2013  . Chronic acquired lymphedema 08/21/2013  . Breast cancer 12/26/2011  . Hypertension   . Swelling of arm   . Carcinoma in situ of cervix   . Ovarian cyst   . Cancer   . Pituitary microadenoma     Otelia Limes, PTA 06/30/2014, 9:00 AM

## 2014-07-07 ENCOUNTER — Ambulatory Visit: Payer: 59 | Admitting: Physical Therapy

## 2014-07-14 ENCOUNTER — Ambulatory Visit: Payer: 59

## 2014-07-14 DIAGNOSIS — C50911 Malignant neoplasm of unspecified site of right female breast: Secondary | ICD-10-CM

## 2014-07-14 DIAGNOSIS — D069 Carcinoma in situ of cervix, unspecified: Secondary | ICD-10-CM | POA: Diagnosis not present

## 2014-07-14 DIAGNOSIS — I890 Lymphedema, not elsewhere classified: Secondary | ICD-10-CM

## 2014-07-14 NOTE — Therapy (Addendum)
[place], [place], [address] Phone: [phone]   Fax:  [phone]  Physical Therapy Treatment  Patient Details  Name: [name] Gibson MRN: [id number] Date of Birth: March 18, 1956  Encounter Date: 07/14/2014      PT End of Session - 07/14/14 0819    Visit Number 46   PT Start Time 0804   PT Stop Time 0853   PT Time Calculation (min) 49 min      Past Medical History  Diagnosis Date  . 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 Ovarian cyst   . Cancer     Lobular breast cancer -Right  . Pituitary microadenoma   . Hypertension   . Swelling of arm   . Complication of anesthesia     some nausea after second mastectomy surgery  . Cellulitis of arm, right 08/21/2013  . Psoriasis     Past Surgical History  Procedure Laterality Date  . Tonsillectomy    . Anal fistula repair    . Breast surgery      Right Mastectomy  . Cervical cone biopsy    . Cholecystectomy    . Colposcopy      There were no vitals taken for this visit.  Visit Diagnosis:  Lymphedema  Breast cancer, right      Subjective Assessment - 07/14/14 0808    Symptoms The nighttime garment seems to be flaring up my tendonitis in my elbow due to it being straight all night. Overall feel like Im doing okay with maintaining.     Manual lymph drainage in supine as follows: short neck, left axillary nodes, right inguinal nodes, superficial and deep abdominals; anterior inter-axillary anastamoses, right axillo-inguinal anastamoses. Right upper extremity from fingers and dorsal hand to lateral shoulder redirecting along pathways.         LYMPHEDEMA/ONCOLOGY QUESTIONNAIRE - 07/14/14 0810    Right Upper Extremity Lymphedema   At [facility] of 2nd Digit 7.5   Across Hand at [employer] 22.8   Just Proximal to Ulnar Styloid Process 22.5   15 cm Proximal to Ulnar Styloid Process 35   Olecranon Process 34.5   15 cm  Proximal to Olecranon Process 43                          Short Term Clinic Goals - 06/03/14 0912    CC Short Term Goal  #1   Title Patient will be able to report overall pain decreased >/= 25% to tolerate daily tasks with less pain.   Time 4   Period Weeks   Status Achieved   CC Short Term Goal  #2   Title Patient will be able to reduce edema by 3 cm at 15 cm proximal to Rt ulnar styloid.   Time 4   Period Weeks   Status Achieved             Long Term Clinic Goals - 06/30/14 7169    CC Long Term Goal  #5   Title patient will have no more than 1 cm at any circumferential measurement   Time 12   Period Weeks   Status On-going   CC Long Term Goal  #6   Title patient will report that she is doing lymphatic remedial exercise daily   Time 12   Period Weeks   Status On-going   CC Long Term Goal  #7   Title  be able to do Strength ABC program for porgressively resistance exercise program 2x/week independently and verbailize how she will progress it   Time 12   Period Weeks   Status On-going            Plan - 07/14/14 9542    Clinical Impression Statement Pt with slight increases again at some circumferential measurements. Pt still reports not being 10% constent with Maintenance Phase of treatment, however is doing better. Pt will benefit from coming next month to reassess measuremnts.   Pt will benefit from skilled therapeutic intervention in order to improve on the following deficits Increased edema;Decreased activity tolerance;Decreased endurance   PT Next Visit Plan monitor and remeasure arm with manual lymph drainage as needed. Pt going to make an appointment to get measured for a new sleeve. Continue encouraging pt with compliance.   PT Home Exercise Plan Pt to try bandaging at night to alleviate Rt elbow soreness , and continue to improve on compliance.        Problem List Patient Active Problem List   Diagnosis Date Noted  . Lymphedema of  arm 12/24/2013  . History of breast cancer 12/24/2013  . Psoriasis 08/23/2013  . Chronic acquired lymphedema 08/21/2013  . Breast cancer 12/26/2011  . Hypertension   . Swelling of arm   . Carcinoma in situ of cervix   . Ovarian cyst   . Cancer   . Pituitary microadenoma     Otelia Limes, PTA 07/14/2014, 9:02 AM  [place], [place], [address] Phone: [phone]   Fax:  [phone]   [place], PT   PHYSICAL THERAPY DISCHARGE SUMMARY  Visits from Start of Care: 48  Current functional level related to goals / functional outcomes: See above    Remaining deficits: Lymphedema of right arm   Education / Equipment: Self manual lymph drainage, use of compression and intermittent pump for home management, home exercise program Plan: Patient agrees to discharge.  Patient goals were partially met. Patient is being discharged due to the patient's request.  ?????        Maudry Diego, PT 06/24/2015 12:53 PM

## 2014-08-18 ENCOUNTER — Ambulatory Visit: Payer: 59

## 2014-08-25 ENCOUNTER — Telehealth: Payer: Self-pay | Admitting: Physical Therapy

## 2014-08-25 NOTE — Telephone Encounter (Signed)
Pt's husband called and cancelled appt for Friday 2/5, pt will call to r/s appt, per husband

## 2014-08-28 ENCOUNTER — Ambulatory Visit: Payer: 59 | Admitting: Physical Therapy

## 2014-09-18 ENCOUNTER — Other Ambulatory Visit: Payer: Self-pay | Admitting: Nurse Practitioner

## 2014-12-04 ENCOUNTER — Other Ambulatory Visit: Payer: Self-pay | Admitting: *Deleted

## 2014-12-04 DIAGNOSIS — C50919 Malignant neoplasm of unspecified site of unspecified female breast: Secondary | ICD-10-CM

## 2014-12-07 ENCOUNTER — Other Ambulatory Visit: Payer: Self-pay | Admitting: *Deleted

## 2014-12-07 ENCOUNTER — Other Ambulatory Visit (HOSPITAL_BASED_OUTPATIENT_CLINIC_OR_DEPARTMENT_OTHER): Payer: 59

## 2014-12-07 DIAGNOSIS — C50919 Malignant neoplasm of unspecified site of unspecified female breast: Secondary | ICD-10-CM

## 2014-12-07 DIAGNOSIS — C50911 Malignant neoplasm of unspecified site of right female breast: Secondary | ICD-10-CM | POA: Diagnosis not present

## 2014-12-07 LAB — COMPREHENSIVE METABOLIC PANEL (CC13)
ALT: 21 U/L (ref 0–55)
AST: 20 U/L (ref 5–34)
Albumin: 3.5 g/dL (ref 3.5–5.0)
Alkaline Phosphatase: 79 U/L (ref 40–150)
Anion Gap: 12 mEq/L — ABNORMAL HIGH (ref 3–11)
BILIRUBIN TOTAL: 0.26 mg/dL (ref 0.20–1.20)
BUN: 14.6 mg/dL (ref 7.0–26.0)
CO2: 27 meq/L (ref 22–29)
CREATININE: 0.7 mg/dL (ref 0.6–1.1)
Calcium: 8.9 mg/dL (ref 8.4–10.4)
Chloride: 100 mEq/L (ref 98–109)
EGFR: >90 mL/min/{1.73_m2} (ref >90)
Glucose: 95 mg/dl (ref 70–140)
Potassium: 3.8 mEq/L (ref 3.5–5.1)
Sodium: 139 mEq/L (ref 136–145)
Total Protein: 6.7 g/dL (ref 6.4–8.3)

## 2014-12-07 LAB — CBC WITH DIFFERENTIAL/PLATELET
BASO%: 0.9 % (ref 0.0–2.0)
Basophils Absolute: 0.1 10*3/uL (ref 0.0–0.1)
EOS%: 2.7 % (ref 0.0–7.0)
Eosinophils Absolute: 0.3 10*3/uL (ref 0.0–0.5)
HCT: 39.9 % (ref 34.8–46.6)
HEMOGLOBIN: 13.6 g/dL (ref 11.6–15.9)
LYMPH#: 2.7 10*3/uL (ref 0.9–3.3)
LYMPH%: 25.7 % (ref 14.0–49.7)
MCH: 30.9 pg (ref 25.1–34.0)
MCHC: 34.1 g/dL (ref 31.5–36.0)
MCV: 90.6 fL (ref 79.5–101.0)
MONO#: 0.9 10*3/uL (ref 0.1–0.9)
MONO%: 9.1 % (ref 0.0–14.0)
NEUT%: 61.6 % (ref 38.4–76.8)
NEUTROS ABS: 6.4 10*3/uL (ref 1.5–6.5)
Platelets: 288 10*3/uL (ref 145–400)
RBC: 4.41 10*6/uL (ref 3.70–5.45)
RDW: 13.4 % (ref 11.2–14.5)
WBC: 10.4 10*3/uL — AB (ref 3.9–10.3)

## 2014-12-08 LAB — CANCER ANTIGEN 27.29: CA 27.29: 22 U/mL (ref 0–39)

## 2014-12-14 ENCOUNTER — Telehealth: Payer: Self-pay | Admitting: Oncology

## 2014-12-14 ENCOUNTER — Ambulatory Visit (HOSPITAL_BASED_OUTPATIENT_CLINIC_OR_DEPARTMENT_OTHER): Payer: 59 | Admitting: Oncology

## 2014-12-14 VITALS — BP 124/76 | HR 94 | Temp 98.3°F | Resp 18 | Ht 66.0 in | Wt 238.1 lb

## 2014-12-14 DIAGNOSIS — I890 Lymphedema, not elsewhere classified: Secondary | ICD-10-CM | POA: Diagnosis not present

## 2014-12-14 DIAGNOSIS — Z79810 Long term (current) use of selective estrogen receptor modulators (SERMs): Secondary | ICD-10-CM

## 2014-12-14 DIAGNOSIS — C50911 Malignant neoplasm of unspecified site of right female breast: Secondary | ICD-10-CM

## 2014-12-14 DIAGNOSIS — Z170 Estrogen receptor positive status [ER+]: Secondary | ICD-10-CM | POA: Diagnosis not present

## 2014-12-14 DIAGNOSIS — Z853 Personal history of malignant neoplasm of breast: Secondary | ICD-10-CM

## 2014-12-14 MED ORDER — TAMOXIFEN CITRATE 20 MG PO TABS: 20.0000 mg | ORAL_TABLET | Freq: Every day | ORAL | Status: DC

## 2014-12-14 NOTE — Telephone Encounter (Signed)
Gave avs & calendar for May 2017 °

## 2014-12-14 NOTE — Progress Notes (Signed)
ID: [name] Gibson   DOB: 21-Jan-1956  MR#: [id number]  DVV#:[id number]  PCP: Vidal Schwalbe, MD GYI:RSWNI Young, NP SU: Johnathan Hausen, MD OTHER MD:  CHIEF COMPLAINT:  Estrogen receptor positive breast cancer  CURRENT TREATMENT: Tamoxifen   HISTORY OF PRESENT ILLNESS: From the original intake note:  She had her annual screening mammogram on 10-24-07.  This showed a possible area of asymmetry in the right breast and she returned on 10-29-07 for diagnostic mammography and right breast ultrasound.  In the lower inner right breast, there was an area of architectural distortion which by ultrasound measured 2.9 cm.  It was ill-defined and hypoechoic.  In addition, in a different quadrant, there was an ill-defined hypoechoic mass up to 8 mm.  Breast specific gamma imaging was obtained on the same day and indeed showed two areas of abnormal uptake as already described.  The one inferiorly measured 2.2 cm. and the one superiorly 7 mm by breast specific gamma imaging.  The patient underwent ultrasound-guided biopsy of both right breast lesions on 11-04-07.  The final pathology showed:  1. An invasive lobular carcinoma (E-cadherin negative). 2. An invasive ductal carcinoma with some evidence of ductal carcinoma in situ.    Her subsequent history is as detailed below  INTERVAL HISTORY: [name] Gibson returns today for follow-up of her breast cancer. The interval history is generally unremarkable. She continues on tamoxifen with excellent tolerance. She has some hot flashes, which actually occur only with activity (and therefore may not actually be hot flashes). She doesn't have any problems with vaginal wetness. She obtains a drug essentially at no cost  REVIEW OF SYSTEMS: [name] Gibson's biggest problem is the right upper extremity lymphedema. She had the arm wrapped and then a new sleeve prescribed October 2015. She has been wearing the sleeve pretty much every day (she needs a new 1). She also uses a pump at  night. This continues to be a major issue but she does her best to work and live as normally as possible. Aside from that problem, she has mild visual changes, psoriasis issues, and minimal forgetfulness. A detailed review of systems today was otherwise noncontributory  PAST MEDICAL HISTORY: Past Medical History  Diagnosis Date  . 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 Ovarian cyst   . Cancer     Lobular breast cancer -Right  . Pituitary microadenoma   . Hypertension   . Swelling of arm   . Complication of anesthesia     some nausea after second mastectomy surgery  . Cellulitis of arm, right 08/21/2013  . Psoriasis   1. History of a possible mitral valve prolapse murmur (the patient currently does not take prophylactic antibiotics before dental procedures). 2. History of Fen-Phen use. 3. History of minimal urinary incontinence. Marland Kitchen    PAST SURGICAL HISTORY: Past Surgical History  Procedure Laterality Date  . Tonsillectomy    . Anal fistula repair    . Breast surgery      Right Mastectomy  . Cervical cone biopsy    . Cholecystectomy    . Colposcopy      FAMILY HISTORY Family History  Problem Relation Age of Onset  . Hypertension Mother   . Melanoma Mother   . Diabetes Father   . Hypertension Father   . Heart disease Father   . Parkinsonism Father   . Breast cancer Cousin 78    passed away form disease  . COPD Brother   . Breast cancer Cousin  The patient's father died at the age of 28 in the setting of Parkinson's disease and multiple other medical problems.  The patient's mother is alive at 63.  She has a history of foot melanoma.  The patient has one brother and one sister.  There is no breast cancer or ovarian cancer in the immediate family.  In the patient's mother's father's family, however, two daughters of one sister have been diagnosed with cancer, one at the age of 24 who unfortunately succumbed and one at the age of 57.  So this would be two first  cousins out of a large number of cousins (recall that the patient's father was one of 12 siblings).  GYNECOLOGIC HISTORY:  (Updated 12/24/2013) She is GX P2, first pregnancy to term age 65. Last menstrual period prior to chemo in 2009.  She has occasional hot flashes.     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  Her husband, [name], is a Naval architect but he is partially disabled with MS, and he is currently doing some mediation.  They have a 53 year old daughter and a 8 year old son.   ADVANCED DIRECTIVES: not in place  HEALTH MAINTENANCE: History  Substance Use Topics  . Smoking status: Former Research scientist (life sciences)  . Smokeless tobacco: Never Used  . Alcohol Use: No     Colonoscopy:    Not on file   PAP: April 2015   Bone density:  April 2013 at [facility], normal   Lipid panel: not on file/Dr. Dema Severin   Allergies  Allergen Reactions  . Sulfa Antibiotics Hives    Current Outpatient Prescriptions  Medication Sig Dispense Refill  . Calcium Carbonate-Vitamin D (CALCIUM + D PO) Take 1 tablet by mouth 2 (two) times daily.     . Cholecalciferol (VITAMIN D PO) Take by mouth.    . escitalopram (LEXAPRO) 10 MG tablet Take 1 tablet (10 mg total) by mouth daily. 90 tablet 4  . hydrochlorothiazide (HYDRODIURIL) 25 MG tablet Take 25 mg by mouth daily.    Marland Kitchen lisinopril (PRINIVIL,ZESTRIL) 40 MG tablet Take 40 mg by mouth daily.    . mupirocin ointment (BACTROBAN) 2 % Place 1 application into the nose 2 (two) times daily. Use as needed for breaks in the skin to prevent recurrent cellulitis. 22 g 0  . potassium chloride SA (K-DUR,KLOR-CON) 20 MEQ tablet Take 20 mEq by mouth daily.    . tamoxifen (NOLVADEX) 20 MG tablet Take 1 tablet (20 mg total) by mouth daily. 90 tablet 3  . zolpidem (AMBIEN) 5 MG tablet Take 5 mg by mouth at bedtime as needed.     No current facility-administered medications for  this visit.    OBJECTIVE: Middle-aged white woman who appears stated age 59 Vitals:   12/14/14 1541  BP: 124/76  Pulse: 94  Temp: 98.3 F (36.8 C)  Resp: 18     Body mass index is 38.45 kg/(m^2).    ECOG FS: 1 Filed Weights   12/14/14 1541  Weight: 238 lb 1.6 oz (108.001 kg)   Sclerae unicteric, pupils round and equal Oropharynx clear and moist-- no thrush or other lesions No cervical or supraclavicular adenopathy Lungs no rales or rhonchi Heart regular rate and rhythm Abd soft, obese, nontender, positive bowel sounds MSK no focal spinal tenderness, chronic right upper extremity lymphedema, grade 3 Neuro: nonfocal, well oriented, appropriate affect Breasts: The right breast is status post mastectomy. There are  telangiectasias as previously noted. There is no evidence of chest wall recurrence. The right axilla is benign. The left breast is unremarkable   LAB RESULTS: Lab Results  Component Value Date   WBC 10.4* 12/07/2014   NEUTROABS 6.4 12/07/2014   HGB 13.6 12/07/2014   HCT 39.9 12/07/2014   MCV 90.6 12/07/2014   PLT 288 12/07/2014      Chemistry      Component Value Date/Time   NA 139 12/07/2014 1551   NA 140 08/25/2013 0413   K 3.8 12/07/2014 1551   K 3.5* 08/25/2013 0413   CL 100 08/25/2013 0413   CL 103 12/20/2012 1054   CO2 27 12/07/2014 1551   CO2 28 08/25/2013 0413   BUN 14.6 12/07/2014 1551   BUN 12 08/25/2013 0413   BUN 16 11/02/2010 1613   CREATININE 0.7 12/07/2014 1551   CREATININE 0.80 08/25/2013 0413   CREATININE 0.8 11/02/2010 1613      Component Value Date/Time   CALCIUM 8.9 12/07/2014 1551   CALCIUM 8.6 08/25/2013 0413   ALKPHOS 79 12/07/2014 1551   ALKPHOS 121* 12/12/2012 0558   AST 20 12/07/2014 1551   AST 56* 12/12/2012 0558   ALT 21 12/07/2014 1551   ALT 49* 12/12/2012 0558   BILITOT 0.26 12/07/2014 1551   BILITOT 0.5 12/12/2012 0558       Lab Results  Component Value Date   LABCA2 22 12/07/2014     STUDIES:    left mammography at [facility] 11/05/2014 was benign    ASSESSMENT: 59 y.o.  [place] woman   (1) status post right modified radical mastectomy in May 2009 for a multifocal/multicentric breast carcinoma, the major component being lobular with a minor ductal component, pathologically T2 N1, Stage IIB, grade 2, estrogen and progesterone receptor positive, HER2 negative, with a low MIB-1,  (2) treated adjuvantly with dose-dense doxorubicin/ cyclophosphamide x4 followed by weekly paclitaxel x12, completed October 2009,   (3) radiation completed on December 2009   (4) started tamoxifen December 2009.  the goal is to continue on tamoxifen for a total of 10 years, until December 2019.  (4) severe lymphedema, right upper extremity      PLAN:  Tye Maryland is now 7 years out from her definitive surgery with no evidence of disease recurrence. This is very favorable. The plan is to continue tamoxifen for a total of 10 years.  I wish I had a solution for her lymphedema problem. We are doing all we know to do at this point. At the least it is not worse than it was last year.  She is concerned because she is getting a suntan to control the psoriasis and it is doing that but at the same time her mother has a history of melanoma. I agree with her that this is a concern. I think though if she has yearly screening skin exams by her dermatologist that should help with that concern.  She will see me again in a year. The visit after that we will schedule with her are survivorship nurse and I think she may be interested in participating in that program.   Chauncey Cruel, MD      12/14/2014

## 2015-02-18 ENCOUNTER — Telehealth: Payer: Self-pay | Admitting: Oncology

## 2015-02-18 NOTE — Telephone Encounter (Signed)
Confirmed appointment change to earlier due to MD schedule change for 12/21/15

## 2015-06-21 ENCOUNTER — Ambulatory Visit (INDEPENDENT_AMBULATORY_CARE_PROVIDER_SITE_OTHER): Payer: 59 | Admitting: Women's Health

## 2015-06-21 ENCOUNTER — Other Ambulatory Visit (HOSPITAL_COMMUNITY)
Admission: RE | Admit: 2015-06-21 | Discharge: 2015-06-21 | Disposition: A | Discharge status: Home / Self Care | Payer: 59 | Source: Ambulatory Visit | Attending: Women's Health | Admitting: Women's Health

## 2015-06-21 ENCOUNTER — Encounter: Payer: Self-pay | Admitting: Women's Health

## 2015-06-21 VITALS — BP 134/80 | Ht 66.0 in | Wt 234.0 lb

## 2015-06-21 DIAGNOSIS — Z01419 Encounter for gynecological examination (general) (routine) without abnormal findings: Secondary | ICD-10-CM

## 2015-06-21 DIAGNOSIS — Z1151 Encounter for screening for human papillomavirus (HPV): Secondary | ICD-10-CM | POA: Diagnosis not present

## 2015-06-21 DIAGNOSIS — N83202 Unspecified ovarian cyst, left side: Secondary | ICD-10-CM | POA: Diagnosis not present

## 2015-06-21 DIAGNOSIS — D069 Carcinoma in situ of cervix, unspecified: Secondary | ICD-10-CM

## 2015-06-21 DIAGNOSIS — C801 Malignant (primary) neoplasm, unspecified: Secondary | ICD-10-CM | POA: Diagnosis not present

## 2015-06-21 NOTE — Patient Instructions (Signed)
Menopause is a normal process in which your reproductive ability comes to an end. This process happens gradually over a span of months to years, usually between the ages of 48 and 55. Menopause is complete when you have missed 12 consecutive menstrual periods. It is important to talk with your health care Madelein Mahadeo about some of the most common conditions that affect postmenopausal women, such as heart disease, cancer, and bone loss (osteoporosis). Adopting a healthy lifestyle and getting preventive care can help to promote your health and wellness. Those actions can also lower your chances of developing some of these common conditions. WHAT SHOULD I KNOW ABOUT MENOPAUSE? During menopause, you may experience a number of symptoms, such as:  Moderate-to-severe hot flashes.  Night sweats.  Decrease in sex drive.  Mood swings.  Headaches.  Tiredness.  Irritability.  Memory problems.  Insomnia. Choosing to treat or not to treat menopausal changes is an individual decision that you make with your health care Leul Narramore. WHAT SHOULD I KNOW ABOUT HORMONE REPLACEMENT THERAPY AND SUPPLEMENTS? Hormone therapy products are effective for treating symptoms that are associated with menopause, such as hot flashes and night sweats. Hormone replacement carries certain risks, especially as you become older. If you are thinking about using estrogen or estrogen with progestin treatments, discuss the benefits and risks with your health care Aniqua Briere. WHAT SHOULD I KNOW ABOUT HEART DISEASE AND STROKE? Heart disease, heart attack, and stroke become more likely as you age. This may be due, in part, to the hormonal changes that your body experiences during menopause. These can affect how your body processes dietary fats, triglycerides, and cholesterol. Heart attack and stroke are both medical emergencies. There are many things that you can do to help prevent heart disease and stroke:  Have your blood pressure  checked at least every 1-2 years. High blood pressure causes heart disease and increases the risk of stroke.  If you are 55-79 years old, ask your health care Orra Nolde if you should take aspirin to prevent a heart attack or a stroke.  Do not use any tobacco products, including cigarettes, chewing tobacco, or electronic cigarettes. If you need help quitting, ask your health care Mercedies Ganesh.  It is important to eat a healthy diet and maintain a healthy weight.  Be sure to include plenty of vegetables, fruits, low-fat dairy products, and lean protein.  Avoid eating foods that are high in solid fats, added sugars, or salt (sodium).  Get regular exercise. This is one of the most important things that you can do for your health.  Try to exercise for at least 150 minutes each week. The type of exercise that you do should increase your heart rate and make you sweat. This is known as moderate-intensity exercise.  Try to do strengthening exercises at least twice each week. Do these in addition to the moderate-intensity exercise.  Know your numbers.Ask your health care Rakeb Kibble to check your cholesterol and your blood glucose. Continue to have your blood tested as directed by your health care Ashleigh Luckow. WHAT SHOULD I KNOW ABOUT CANCER SCREENING? There are several types of cancer. Take the following steps to reduce your risk and to catch any cancer development as early as possible. Breast Cancer  Practice breast self-awareness.  This means understanding how your breasts normally appear and feel.  It also means doing regular breast self-exams. Let your health care Noriko Macari know about any changes, no matter how small.  If you are 40 or older, have a clinician do a   breast exam (clinical breast exam or CBE) every year. Depending on your age, family history, and medical history, it may be recommended that you also have a yearly breast X-ray (mammogram).  If you have a family history of breast cancer,  talk with your health care Dannia Snook about genetic screening.  If you are at high risk for breast cancer, talk with your health care Coreon Simkins about having an MRI and a mammogram every year.  Breast cancer (BRCA) gene test is recommended for women who have family members with BRCA-related cancers. Results of the assessment will determine the need for genetic counseling and BRCA1 and for BRCA2 testing. BRCA-related cancers include these types:  Breast. This occurs in males or females.  Ovarian.  Tubal. This may also be called fallopian tube cancer.  Cancer of the abdominal or pelvic lining (peritoneal cancer).  Prostate.  Pancreatic. Cervical, Uterine, and Ovarian Cancer Your health care Francille Wittmann may recommend that you be screened regularly for cancer of the pelvic organs. These include your ovaries, uterus, and vagina. This screening involves a pelvic exam, which includes checking for microscopic changes to the surface of your cervix (Pap test).  For women ages 21-65, health care providers may recommend a pelvic exam and a Pap test every three years. For women ages 77-65, they may recommend the Pap test and pelvic exam, combined with testing for human papilloma virus (HPV), every five years. Some types of HPV increase your risk of cervical cancer. Testing for HPV may also be done on women of any age who have unclear Pap test results.  Other health care providers may not recommend any screening for nonpregnant women who are considered low risk for pelvic cancer and have no symptoms. Ask your health care Keitha Kolk if a screening pelvic exam is right for you.  If you have had past treatment for cervical cancer or a condition that could lead to cancer, you need Pap tests and screening for cancer for at least 20 years after your treatment. If Pap tests have been discontinued for you, your risk factors (such as having a new sexual partner) need to be reassessed to determine if you should start having  screenings again. Some women have medical problems that increase the chance of getting cervical cancer. In these cases, your health care Prinston Kynard may recommend that you have screening and Pap tests more often.  If you have a family history of uterine cancer or ovarian cancer, talk with your health care Chalyn Amescua about genetic screening.  If you have vaginal bleeding after reaching menopause, tell your health care Aleya Durnell.  There are currently no reliable tests available to screen for ovarian cancer. Lung Cancer Lung cancer screening is recommended for adults 3-70 years old who are at high risk for lung cancer because of a history of smoking. A yearly low-dose CT scan of the lungs is recommended if you:  Currently smoke.  Have a history of at least 30 pack-years of smoking and you currently smoke or have quit within the past 15 years. A pack-year is smoking an average of one pack of cigarettes per day for one year. Yearly screening should:  Continue until it has been 15 years since you quit.  Stop if you develop a health problem that would prevent you from having lung cancer treatment. Colorectal Cancer  This type of cancer can be detected and can often be prevented.  Routine colorectal cancer screening usually begins at age 38 and continues through age 12.  If you have  risk factors for colon cancer, your health care Jax Kentner may recommend that you be screened at an earlier age.  If you have a family history of colorectal cancer, talk with your health care Kelce Bouton about genetic screening.  Your health care Zachary Lovins may also recommend using home test kits to check for hidden blood in your stool.  A small camera at the end of a tube can be used to examine your colon directly (sigmoidoscopy or colonoscopy). This is done to check for the earliest forms of colorectal cancer.  Direct examination of the colon should be repeated every 5-10 years until age 67. However, if early forms of  precancerous polyps or small growths are found or if you have a family history or genetic risk for colorectal cancer, you may need to be screened more often. Skin Cancer  Check your skin from head to toe regularly.  Monitor any moles. Be sure to tell your health care Yannet Rincon:  About any new moles or changes in moles, especially if there is a change in a mole's shape or color.  If you have a mole that is larger than the size of a pencil eraser.  If any of your family members has a history of skin cancer, especially at a young age, talk with your health care Ashland Osmer about genetic screening.  Always use sunscreen. Apply sunscreen liberally and repeatedly throughout the day.  Whenever you are outside, protect yourself by wearing long sleeves, pants, a wide-brimmed hat, and sunglasses. WHAT SHOULD I KNOW ABOUT OSTEOPOROSIS? Osteoporosis is a condition in which bone destruction happens more quickly than new bone creation. After menopause, you may be at an increased risk for osteoporosis. To help prevent osteoporosis or the bone fractures that can happen because of osteoporosis, the following is recommended:  If you are 39-61 years old, get at least 1,000 mg of calcium and at least 600 mg of vitamin D per day.  If you are older than age 16 but younger than age 7, get at least 1,200 mg of calcium and at least 600 mg of vitamin D per day.  If you are older than age 47, get at least 1,200 mg of calcium and at least 800 mg of vitamin D per day. Smoking and excessive alcohol intake increase the risk of osteoporosis. Eat foods that are rich in calcium and vitamin D, and do weight-bearing exercises several times each week as directed by your health care Silverio Hagan. WHAT SHOULD I KNOW ABOUT HOW MENOPAUSE AFFECTS Russell? Depression may occur at any age, but it is more common as you become older. Common symptoms of depression include:  Low or sad mood.  Changes in sleep patterns.  Changes  in appetite or eating patterns.  Feeling an overall lack of motivation or enjoyment of activities that you previously enjoyed.  Frequent crying spells. Talk with your health care Marietta Sikkema if you think that you are experiencing depression. WHAT SHOULD I KNOW ABOUT IMMUNIZATIONS? It is important that you get and maintain your immunizations. These include:  Tetanus, diphtheria, and pertussis (Tdap) booster vaccine.  Influenza every year before the flu season begins.  Pneumonia vaccine.  Shingles vaccine. Your health care Hailynn Slovacek may also recommend other immunizations.   This information is not intended to replace advice given to you by your health care Anne Boltz. Make sure you discuss any questions you have with your health care Markos Theil.   Document Released: 09/01/2005 Document Revised: 07/31/2014 Document Reviewed: 03/12/2014 Elsevier Interactive Patient Education 2016 Elsevier  Inc.

## 2015-06-21 NOTE — Progress Notes (Signed)
[name] Gibson 02/08/1956 [id number]    History:    Presents for annual exam.  Postmenopausal on no HRT with no bleeding. 2009 right breast cancer had mastectomy, radiation and chemotherapy. Continues to struggle with right arm lymphedema. BRCA negative. 07/2008 tamoxifen started. 2013 DEXA T score +1.7 spine, +1.2 hip. 1988 CIN-3 cone with normal Paps after. History of a left ovarian cyst with a low CA-125. Has lost 20 pounds in the past year with diet and exercise.  Past medical history, past surgical history, family history and social history were all reviewed and documented in the EPIC chart. Works at [employer] in the office. Husband MS.  ROS:  A ROS was performed and pertinent positives and negatives are included.  Exam:  Filed Vitals:   06/21/15 1530  BP: 134/80    General appearance:  Normal Thyroid:  Symmetrical, normal in size, without palpable masses or nodularity. Respiratory  Auscultation:  Clear without wheezing or rhonchi Cardiovascular  Auscultation:  Regular rate, without rubs, murmurs or gallops  Edema/varicosities:  Not grossly evident Abdominal  Soft,nontender, without masses, guarding or rebound.  Liver/spleen:  No organomegaly noted  Hernia:  None appreciated  Skin  Inspection:  Grossly normal   Breasts: Examined lying and sitting.     Right: Mastectomy     Left: Without masses, retractions, discharge or axillary adenopathy. Gentitourinary   Inguinal/mons:  Normal without inguinal adenopathy  External genitalia:  Normal  BUS/Urethra/Skene's glands:  Normal  Vagina:  Normal  Cervix:  Normal  Uterus:  normal in size, shape and contour.  Midline and mobile  Adnexa/parametria:     Rt: Without masses or tenderness.   Lt: Without masses or tenderness.  Anus and perineum: Normal  Digital rectal exam: Normal sphincter tone without palpated masses or tenderness  Assessment/Plan:  59 y.o. M WF G2 P2 for annual exam with no complaints.  2009 right breast cancer -  mastectomy/radiation/chemotherapy-BRCA negative Right arm lymphedema-PT, elastic sleeve 1988 CIN-3 Conization normal after Left ovarian cyst Obesity Hypertension-meds and Labs primary care.  Plan: Schedule ultrasound. SBE's, continue annual screening mammogram, calcium rich diet, vitamin D 1000 daily encouraged. Reviewed importance of weightbearing exercise, continue cutting calories for continued weight loss. Congratulated on weight loss. UA, Pap with HR HPV typing, new screening guidelines reviewed.     Huel Cote [facility], 4:37 PM 06/21/2015

## 2015-06-23 LAB — CYTOLOGY - PAP

## 2015-07-07 ENCOUNTER — Ambulatory Visit (INDEPENDENT_AMBULATORY_CARE_PROVIDER_SITE_OTHER): Payer: 59

## 2015-07-07 ENCOUNTER — Encounter: Payer: Self-pay | Admitting: Women's Health

## 2015-07-07 ENCOUNTER — Ambulatory Visit (INDEPENDENT_AMBULATORY_CARE_PROVIDER_SITE_OTHER): Payer: 59 | Admitting: Women's Health

## 2015-07-07 VITALS — BP 130/80 | Ht 66.0 in | Wt 234.0 lb

## 2015-07-07 DIAGNOSIS — N83202 Unspecified ovarian cyst, left side: Secondary | ICD-10-CM | POA: Diagnosis not present

## 2015-07-07 NOTE — Progress Notes (Signed)
Patient ID: [name] Gibson, female   DOB: Mar 23, 1956, 59 y.o.   MRN: [id number] Presents for ultrasound. History of persistent left ovarian cyst since 2009. Ultrasounds 2013  45 mm mean, 2014  41 mm mean, endometrium 7.9 CA 125  14. 2009 right breast cancer, mastectomy BRCA negative, has been on tamoxifen since January 2010.  Denies vaginal spotting/bleeding, abdominal pain/pressure or changes in elimination.  Exam: Appears well. Continues with right arm lymphedema. Ultrasound: T/V anteverted uterus homogeneous echo. Endometrium 7.6 mm. Right ovary normal. Left ovarian tissue not seen, left adnexal thin-walled echo-free cyst 48 x 35 x 53 mm, 45 mm mean. Avascular negative cul-de-sac.  Persistent left ovarian cyst Stable endometrium on tamoxifen with no bleeding or spotting 2009 Right breast cancer  BRCA negative  Plan: Ultrasound reviewed with Dr. Toney Rakes, repeat CA 125. Offered GYN oncology consult for possible cystectomy, continue surveillance, repeat ultrasound in 6 months. After discussing options would prefer to recheck ultrasound in 6 months for stability. Instructed to call or return if any spotting.

## 2015-07-08 LAB — CA 125: CA 125: 16 U/mL (ref <35)

## 2015-07-26 MED FILL — ESCITALOPRAM 10 MG TABLET: 10 | 90 days supply | Qty: 90 | Fill #1

## 2015-07-26 MED FILL — BELVIQ 10 MG TABLET: 10 | 30 days supply | Qty: 60 | Fill #0

## 2015-08-06 MED FILL — POTASSIUM CL ER 20 MEQ TAB: 20 | 90 days supply | Qty: 90 | Fill #0

## 2015-08-24 MED FILL — BELVIQ 10 MG TABLET: 10 | 30 days supply | Qty: 60 | Fill #0

## 2015-09-15 DIAGNOSIS — I10 Essential (primary) hypertension: Secondary | ICD-10-CM | POA: Diagnosis not present

## 2015-09-15 DIAGNOSIS — F324 Major depressive disorder, single episode, in partial remission: Secondary | ICD-10-CM | POA: Diagnosis not present

## 2015-09-15 DIAGNOSIS — E6609 Other obesity due to excess calories: Secondary | ICD-10-CM | POA: Diagnosis not present

## 2015-09-15 DIAGNOSIS — Z6838 Body mass index (BMI) 38.0-38.9, adult: Secondary | ICD-10-CM | POA: Diagnosis not present

## 2015-09-15 DIAGNOSIS — C50911 Malignant neoplasm of unspecified site of right female breast: Secondary | ICD-10-CM | POA: Diagnosis not present

## 2015-09-15 MED FILL — BELVIQ 10 MG TABLET: 10 | 30 days supply | Qty: 60 | Fill #0

## 2015-09-15 MED FILL — LISINOPRIL 40 MG TABLET: 40 | 90 days supply | Qty: 90 | Fill #0

## 2015-09-17 MED FILL — ZOLPIDEM TARTRATE 10 MG TAB: 10 | 90 days supply | Qty: 90 | Fill #0

## 2015-09-27 MED FILL — TAMOXIFEN 20 MG TABLET: 20 | 90 days supply | Qty: 90 | Fill #3

## 2015-10-12 MED FILL — HYDROCHLOROTHIAZIDE 25 MG T: 25 | 90 days supply | Qty: 90 | Fill #0

## 2015-10-25 MED FILL — ESCITALOPRAM 10 MG TABLET: 10 | 90 days supply | Qty: 90 | Fill #0

## 2015-11-01 MED FILL — BELVIQ 10 MG TABLET: 10 | 30 days supply | Qty: 60 | Fill #0

## 2015-11-02 MED FILL — KLOR-CON M20 TABLET: 20 | 90 days supply | Qty: 90 | Fill #1

## 2015-11-08 DIAGNOSIS — Z1231 Encounter for screening mammogram for malignant neoplasm of breast: Secondary | ICD-10-CM | POA: Diagnosis not present

## 2015-11-08 DIAGNOSIS — Z853 Personal history of malignant neoplasm of breast: Secondary | ICD-10-CM | POA: Diagnosis not present

## 2015-11-16 DIAGNOSIS — E6609 Other obesity due to excess calories: Secondary | ICD-10-CM | POA: Diagnosis not present

## 2015-11-16 DIAGNOSIS — Z6837 Body mass index (BMI) 37.0-37.9, adult: Secondary | ICD-10-CM | POA: Diagnosis not present

## 2015-11-18 MED FILL — ETODOLAC 400 MG TABLET: 400 | 10 days supply | Qty: 30 | Fill #0

## 2015-11-29 MED FILL — ETODOLAC 400 MG TABLET: 400 | 10 days supply | Qty: 30 | Fill #1

## 2015-11-30 MED FILL — BELVIQ 10 MG TABLET: 10 | 30 days supply | Qty: 60 | Fill #1

## 2015-12-05 ENCOUNTER — Telehealth: Payer: 59 | Admitting: Physician Assistant

## 2015-12-05 DIAGNOSIS — J029 Acute pharyngitis, unspecified: Secondary | ICD-10-CM

## 2015-12-05 NOTE — Progress Notes (Signed)
    We are sorry that you are not feeling well.  Here is how we plan to help! Based on what you have shared with me it looks like you may have either a viral pharyngitis or potentially strep throat due to exposure from your son. Strep throat needs to be diagnosed with a physical examination of the throat and strep testing in office.   Please stay very well-hydrated. Salt-water gargles and Tylenol for throat pain. A humidifier in the bedroom will help moisten the throat and ease pain overnight. Please schedule an appointment with your PCP for strep testing. You can also seek care at an Urgent Care.  Your e-visit answers were reviewed by a board certified advanced clinical practitioner to complete your personal care plan.  Depending on the condition, your plan could have included both over the counter or prescription medications.  If there is a problem please reply  once you have received a response from your provider.  Your safety is important to [place].  If you have drug allergies check your prescription carefully.    You can use MyChart to ask questions about today's visit, request a non-urgent call back, or ask for a work or school excuse for 24 hours related to this e-Visit. If it has been greater than 24 hours you will need to follow up with your provider, or enter a new e-Visit to address those concerns.  You will get an e-mail in the next two days asking about your experience.  I hope that your e-visit has been valuable and will speed your recovery. Thank you for using e-visits.

## 2015-12-13 ENCOUNTER — Other Ambulatory Visit (HOSPITAL_BASED_OUTPATIENT_CLINIC_OR_DEPARTMENT_OTHER): Payer: 59

## 2015-12-13 DIAGNOSIS — Z853 Personal history of malignant neoplasm of breast: Secondary | ICD-10-CM

## 2015-12-13 DIAGNOSIS — C50911 Malignant neoplasm of unspecified site of right female breast: Secondary | ICD-10-CM | POA: Diagnosis not present

## 2015-12-13 LAB — CBC WITH DIFFERENTIAL/PLATELET
BASO%: 0.6 % (ref 0.0–2.0)
Basophils Absolute: 0.1 10*3/uL (ref 0.0–0.1)
EOS%: 2.3 % (ref 0.0–7.0)
Eosinophils Absolute: 0.2 10*3/uL (ref 0.0–0.5)
HCT: 39.7 % (ref 34.8–46.6)
HGB: 13.6 g/dL (ref 11.6–15.9)
LYMPH%: 32.3 % (ref 14.0–49.7)
MCH: 31.3 pg (ref 25.1–34.0)
MCHC: 34.3 g/dL (ref 31.5–36.0)
MCV: 91.3 fL (ref 79.5–101.0)
MONO#: 0.9 10*3/uL (ref 0.1–0.9)
MONO%: 8.7 % (ref 0.0–14.0)
NEUT%: 56.1 % (ref 38.4–76.8)
NEUTROS ABS: 5.6 10*3/uL (ref 1.5–6.5)
PLATELETS: 263 10*3/uL (ref 145–400)
RBC: 4.35 10*6/uL (ref 3.70–5.45)
RDW: 12.5 % (ref 11.2–14.5)
WBC: 9.9 10*3/uL (ref 3.9–10.3)
lymph#: 3.2 10*3/uL (ref 0.9–3.3)

## 2015-12-13 LAB — COMPREHENSIVE METABOLIC PANEL
ALT: 20 U/L (ref 0–55)
AST: 17 U/L (ref 5–34)
Albumin: 3.4 g/dL — ABNORMAL LOW (ref 3.5–5.0)
Alkaline Phosphatase: 76 U/L (ref 40–150)
Anion Gap: 10 mEq/L (ref 3–11)
BUN: 15.4 mg/dL (ref 7.0–26.0)
CHLORIDE: 100 meq/L (ref 98–109)
CO2: 29 meq/L (ref 22–29)
CREATININE: 0.8 mg/dL (ref 0.6–1.1)
Calcium: 8.7 mg/dL (ref 8.4–10.4)
EGFR: 87 mL/min/{1.73_m2} — ABNORMAL LOW (ref >90)
GLUCOSE: 95 mg/dL (ref 70–140)
Potassium: 4 mEq/L (ref 3.5–5.1)
Sodium: 139 mEq/L (ref 136–145)
TOTAL PROTEIN: 6.7 g/dL (ref 6.4–8.3)

## 2015-12-14 LAB — CANCER ANTIGEN 27-29 (PARALLEL TESTING): CA 27.29: 22 U/mL (ref <38)

## 2015-12-14 LAB — CANCER ANTIGEN 27.29: CA 27.29: 22.6 U/mL (ref 0.0–38.6)

## 2015-12-15 MED FILL — ZOLPIDEM TARTRATE 10 MG TAB: 10 | 90 days supply | Qty: 90 | Fill #0

## 2015-12-21 ENCOUNTER — Ambulatory Visit (HOSPITAL_BASED_OUTPATIENT_CLINIC_OR_DEPARTMENT_OTHER): Payer: 59 | Admitting: Oncology

## 2015-12-21 ENCOUNTER — Ambulatory Visit: Payer: 59 | Admitting: Oncology

## 2015-12-21 ENCOUNTER — Telehealth: Payer: Self-pay | Admitting: Oncology

## 2015-12-21 VITALS — BP 117/88 | HR 103 | Temp 98.6°F | Resp 18 | Ht 66.0 in | Wt 241.6 lb

## 2015-12-21 DIAGNOSIS — L409 Psoriasis, unspecified: Secondary | ICD-10-CM | POA: Diagnosis not present

## 2015-12-21 DIAGNOSIS — N951 Menopausal and female climacteric states: Secondary | ICD-10-CM | POA: Diagnosis not present

## 2015-12-21 DIAGNOSIS — Z808 Family history of malignant neoplasm of other organs or systems: Secondary | ICD-10-CM

## 2015-12-21 DIAGNOSIS — C50911 Malignant neoplasm of unspecified site of right female breast: Secondary | ICD-10-CM | POA: Diagnosis not present

## 2015-12-21 DIAGNOSIS — I890 Lymphedema, not elsewhere classified: Secondary | ICD-10-CM | POA: Diagnosis not present

## 2015-12-21 DIAGNOSIS — Z853 Personal history of malignant neoplasm of breast: Secondary | ICD-10-CM

## 2015-12-21 MED ORDER — TAMOXIFEN CITRATE 20 MG PO TABS: 20.0000 mg | ORAL_TABLET | Freq: Every day | ORAL | Status: DC

## 2015-12-21 MED FILL — TAMOXIFEN 20 MG TABLET: 20 | 90 days supply | Qty: 90 | Fill #0

## 2015-12-21 NOTE — Progress Notes (Signed)
ID: [name] Gibson   DOB: [birth year]  MR#: [id number]  VOJ#:[id number]  PCP: Vidal Schwalbe, MD XHB:ZJIRC Young, NP SU: Johnathan Hausen, MD OTHER MD:  CHIEF COMPLAINT:  Estrogen receptor positive breast cancer  CURRENT TREATMENT: Tamoxifen   HISTORY OF PRESENT ILLNESS: From the original intake note:  She had her annual screening mammogram on 10-24-07.  This showed a possible area of asymmetry in the right breast and she returned on 10-29-07 for diagnostic mammography and right breast ultrasound.  In the lower inner right breast, there was an area of architectural distortion which by ultrasound measured 2.9 cm.  It was ill-defined and hypoechoic.  In addition, in a different quadrant, there was an ill-defined hypoechoic mass up to 8 mm.  Breast specific gamma imaging was obtained on the same day and indeed showed two areas of abnormal uptake as already described.  The one inferiorly measured 2.2 cm. and the one superiorly 7 mm by breast specific gamma imaging.  The patient underwent ultrasound-guided biopsy of both right breast lesions on 11-04-07.  The final pathology showed:  1. An invasive lobular carcinoma (E-cadherin negative). 2. An invasive ductal carcinoma with some evidence of ductal carcinoma in situ.    Her subsequent history is as detailed below  INTERVAL HISTORY: [name] Gibson returns today for follow-up of her estrogen receptor positive breast cancer. She continues on tamoxifen, with good tolerance. She does still have some hot flashes as the main side effect.  REVIEW OF SYSTEMS: [name] Gibson lost her dog 2 cancer a few days ago and she also has a good friend who is now in a plan with breast cancer "not doing well". All this weighs on her. She enjoys her work. She is not looking forward to an empty mass which will be a year from now when her son goes to college. Psoriasis is stable. She does some tanning for the psoriasis. Recall her mother had melanoma on the plantar aspect of the foot. The  right upper extremity lymphedema is stable as well. She is not exercising regularly. She does have an exercise room however in the house and they're just finishing fitting it up. A detailed review of systems today was otherwise noncontributory  PAST MEDICAL HISTORY: Past Medical History  Diagnosis Date  . Ovarian cyst   . Pituitary microadenoma ([place])   . Hypertension   . Complication of anesthesia     some nausea after second mastectomy surgery  . Cellulitis of arm, right 08/21/2013  . Psoriasis   1. History of a possible mitral valve prolapse murmur (the patient currently does not take prophylactic antibiotics before dental procedures). 2. History of Fen-Phen use. 3. History of minimal urinary incontinence. Marland Kitchen    PAST SURGICAL HISTORY: Past Surgical History  Procedure Laterality Date  . Tonsillectomy    . Anal fistula repair    . Breast surgery      Right Mastectomy  . Cervical cone biopsy    . Cholecystectomy    . Colposcopy      FAMILY HISTORY Family History  Problem Relation Age of Onset  . Hypertension Mother   . Melanoma Mother   . Diabetes Father   . Hypertension Father   . Heart disease Father   . Parkinsonism Father   . Breast cancer Cousin 43    passed away form disease  . COPD Brother   . Breast cancer Cousin   The patient's father died at the age of 76 in the setting of Parkinson's disease and multiple  other medical problems.  The patient's mother is alive at 32.  She has a history of foot melanoma.  The patient has one brother and one sister.  There is no breast cancer or ovarian cancer in the immediate family.  In the patient's mother's father's family, however, two daughters of one sister have been diagnosed with cancer, one at the age of 3 who unfortunately succumbed and one at the age of 95.  So this would be two first cousins out of a large number of cousins (recall that the patient's father was one of 12 siblings).  GYNECOLOGIC HISTORY:  (Updated  12/24/2013) She is GX P2, first pregnancy to term age 89. Last menstrual period prior to chemo in 2009.  She has occasional hot flashes.     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  Her husband, [name] Gibson, is a Naval architect but he is partially disabled with MS, and he is currently doing some mediation.  They have a 52 year old daughter and a 11 year old son.   ADVANCED DIRECTIVES: not in place  HEALTH MAINTENANCE: Social History  Substance Use Topics  . Smoking status: Former Research scientist (life sciences)  . Smokeless tobacco: Never Used  . Alcohol Use: No     Colonoscopy:    Not on file   PAP: April 2015   Bone density:  April 2013 at [facility], normal   Lipid panel: not on file/Dr. Dema Severin   Allergies  Allergen Reactions  . Sulfa Antibiotics Hives    Current Outpatient Prescriptions  Medication Sig Dispense Refill  . BELVIQ 10 MG TABS Take 1 tablet by mouth daily.  2  . Calcium Carbonate-Vitamin D (CALCIUM + D PO) Take 1 tablet by mouth 2 (two) times daily.     Marland Kitchen escitalopram (LEXAPRO) 10 MG tablet Take 1 tablet (10 mg total) by mouth daily. 90 tablet 4  . hydrochlorothiazide (HYDRODIURIL) 25 MG tablet Take 25 mg by mouth daily.    Marland Kitchen lisinopril (PRINIVIL,ZESTRIL) 40 MG tablet Take 40 mg by mouth daily.    . potassium chloride SA (K-DUR,KLOR-CON) 20 MEQ tablet Take 20 mEq by mouth daily.    . tamoxifen (NOLVADEX) 20 MG tablet Take 1 tablet (20 mg total) by mouth daily. 90 tablet 3  . zolpidem (AMBIEN) 5 MG tablet Take 5 mg by mouth at bedtime as needed.     No current facility-administered medications for this visit.    OBJECTIVE: Middle-aged white woman In no acute distress Filed Vitals:   12/21/15 1519  BP: 117/88  Pulse: 103  Temp: 98.6 F (37 C)  Resp: 18     Body mass index is 39.01 kg/(m^2).    ECOG FS: 1 Filed Weights   12/21/15 1519  Weight: 241 lb 9.6 oz (109.589 kg)    Sclerae unicteric, EOMs intact Oropharynx clear, dentition in good repair No cervical or supraclavicular adenopathy Lungs no rales or rhonchi Heart regular rate and rhythm Abd soft, Obese, nontender, positive bowel sounds MSK no focal spinal tenderness, grade 2 right upper extremity lymphedema, with sleeve in place Neuro: nonfocal, well oriented, appropriate affect Breasts: The right breast is status post mastectomy and radiation. Telangiectasias are as previously noted. The right axilla is benign. There is no evidence of chest wall recurrence. The left breast is unremarkable   LAB RESULTS: Lab Results  Component Value Date   WBC 9.9 12/13/2015   NEUTROABS 5.6 12/13/2015   HGB 13.6  12/13/2015   HCT 39.7 12/13/2015   MCV 91.3 12/13/2015   PLT 263 12/13/2015      Chemistry      Component Value Date/Time   NA 139 12/13/2015 1602   NA 140 08/25/2013 0413   K 4.0 12/13/2015 1602   K 3.5* 08/25/2013 0413   CL 100 08/25/2013 0413   CL 103 12/20/2012 1054   CO2 29 12/13/2015 1602   CO2 28 08/25/2013 0413   BUN 15.4 12/13/2015 1602   BUN 12 08/25/2013 0413   BUN 16 11/02/2010 1613   CREATININE 0.8 12/13/2015 1602   CREATININE 0.80 08/25/2013 0413   CREATININE 0.8 11/02/2010 1613      Component Value Date/Time   CALCIUM 8.7 12/13/2015 1602   CALCIUM 8.6 08/25/2013 0413   ALKPHOS 76 12/13/2015 1602   ALKPHOS 121* 12/12/2012 0558   AST 17 12/13/2015 1602   AST 56* 12/12/2012 0558   ALT 20 12/13/2015 1602   ALT 49* 12/12/2012 0558   BILITOT <0.30 12/13/2015 1602   BILITOT 0.5 12/12/2012 0558       Lab Results  Component Value Date   LABCA2 22 12/13/2015     STUDIES:  Unilateral left mammography at [facility] 11/08/2015 with tomosynthesis found the breast density to be category be. There was no evidence of malignancy.   ASSESSMENT: 60 y.o.  [place] woman   (1) status post right modified radical mastectomy in May 2009 for a multifocal/multicentric breast  carcinoma, the major component being lobular with a minor ductal component, pathologically T2 N1, Stage IIB, grade 2, estrogen and progesterone receptor positive, HER2 negative, with a low MIB-1,  (2) treated adjuvantly with dose-dense doxorubicin/ cyclophosphamide x4 followed by weekly paclitaxel x12, completed October 2009,   (3) radiation completed on December 2009   (4) started tamoxifen December 2009.  the goal is to continue on tamoxifen for a total of 10 years, until December 2019.  (4)  lymphedema, right upper extremity      PLAN:  [name] Gibson is now 8 years out from definitive surgery for her breast cancer with no evidence of disease recurrence. This is very favorable area  We again discussed my concern regarding her family history of melanoma and the fact that she is using a tanning bed for control of her psoriasis. At a minimum she really needs to have a yearly dermatologic exam and she tells me that she will operationalize this. Of course she also needs a yearly fundal exam. That she is already doing  We discussed her right upper extremity lymphedema. She is wearing the sleeve most of the time. She is not doing a lot of massage. She did not think she wanted to have it wrapped this summer but perhaps in the winter she might be interested and if so she will let us know  I'm going to ask her to see our breast survivorship nurse practitioner a year from now so she can become acquainted with that program and then [name] Gibson will see me to years from now. At that time she may "graduate" from follow-up or follow-up thereafter with survivorship at her choice   Chauncey Cruel, MD      12/21/2015

## 2015-12-21 NOTE — Telephone Encounter (Signed)
appt made and avs printed °

## 2015-12-31 MED FILL — BELVIQ 10 MG TABLET: 10 | 30 days supply | Qty: 60 | Fill #1

## 2015-12-31 MED FILL — LISINOPRIL 40 MG TABLET: 40 | 90 days supply | Qty: 90 | Fill #1

## 2016-01-12 MED FILL — HYDROCHLOROTHIAZIDE 25 MG T: 25 | 90 days supply | Qty: 90 | Fill #1

## 2016-01-19 ENCOUNTER — Encounter: Payer: Self-pay | Admitting: Oncology

## 2016-01-24 MED FILL — ESCITALOPRAM 10 MG TABLET: 10 | 90 days supply | Qty: 90 | Fill #1

## 2016-01-26 ENCOUNTER — Other Ambulatory Visit: Payer: Self-pay | Admitting: Women's Health

## 2016-01-26 ENCOUNTER — Ambulatory Visit (INDEPENDENT_AMBULATORY_CARE_PROVIDER_SITE_OTHER): Payer: 59 | Admitting: Women's Health

## 2016-01-26 ENCOUNTER — Other Ambulatory Visit: Payer: 59

## 2016-01-26 ENCOUNTER — Ambulatory Visit: Payer: 59 | Admitting: Women's Health

## 2016-01-26 ENCOUNTER — Ambulatory Visit (INDEPENDENT_AMBULATORY_CARE_PROVIDER_SITE_OTHER): Payer: 59

## 2016-01-26 ENCOUNTER — Encounter: Payer: Self-pay | Admitting: Women's Health

## 2016-01-26 DIAGNOSIS — Z853 Personal history of malignant neoplasm of breast: Secondary | ICD-10-CM

## 2016-01-26 DIAGNOSIS — N83202 Unspecified ovarian cyst, left side: Secondary | ICD-10-CM

## 2016-01-26 DIAGNOSIS — N83209 Unspecified ovarian cyst, unspecified side: Secondary | ICD-10-CM

## 2016-01-26 NOTE — Progress Notes (Signed)
Patient ID: [name] Gibson, female   DOB: 19-Jun-1956, 60 y.o.   MRN: [id number]  Presents for ultrasound for follow-up of asymptomatic left ovarian cyst since 2009, found on testing for breast cancer surveillance . 06/2015 ovarian cyst mean 45 mm, endometrium 7 mm. Ca 125 12. 2009 right breast cancer mastectomy, radiation and chemotherapy. On tamoxifen since 07/2008, no bleeding or spotting.   Exam: Appears well. Persistent right arm lymphedema. Ultrasound: T/V anteverted uterus with cortical cystic areas history of tamoxifen use. Right ovary atrophic normal echo. Left ovary rim of tissue thin-walled echo-free cyst 43 x 42 x 40 644 mm mean, negative CFD, second cyst 29 x 26 mm. Echo-free avascular. Negative cul-de-sac. Endometrium 6.4 mm  Persistent left ovarian cyst with new small second cyst Right breast cancer history on tamoxifen  Plan: CA-125, if negative repeat ultrasound in 6 months. Has been offered cystectomy in the past prefers  surveillance

## 2016-01-26 NOTE — Patient Instructions (Signed)
Ovarian Cyst An ovarian cyst is a fluid-filled sac that forms on an ovary. The ovaries are small organs that produce eggs in women. Various types of cysts can form on the ovaries. Most are not cancerous. Many do not cause problems, and they often go away on their own. Some may cause symptoms and require treatment. Common types of ovarian cysts include:  Functional cysts--These cysts may occur every month during the menstrual cycle. This is normal. The cysts usually go away with the next menstrual cycle if the woman does not get pregnant. Usually, there are no symptoms with a functional cyst.  Endometrioma cysts--These cysts form from the tissue that lines the uterus. They are also called "chocolate cysts" because they become filled with blood that turns brown. This type of cyst can cause pain in the lower abdomen during intercourse and with your menstrual period.  Cystadenoma cysts--This type develops from the cells on the outside of the ovary. These cysts can get very big and cause lower abdomen pain and pain with intercourse. This type of cyst can twist on itself, cut off its blood supply, and cause severe pain. It can also easily rupture and cause a lot of pain.  Dermoid cysts--This type of cyst is sometimes found in both ovaries. These cysts may contain different kinds of body tissue, such as skin, teeth, hair, or cartilage. They usually do not cause symptoms unless they get very big.  Theca lutein cysts--These cysts occur when too much of a certain hormone (human chorionic gonadotropin) is produced and overstimulates the ovaries to produce an egg. This is most common after procedures used to assist with the conception of a baby (in vitro fertilization). CAUSES   Fertility drugs can cause a condition in which multiple large cysts are formed on the ovaries. This is called ovarian hyperstimulation syndrome.  A condition called polycystic ovary syndrome can cause hormonal imbalances that can lead to  nonfunctional ovarian cysts. SIGNS AND SYMPTOMS  Many ovarian cysts do not cause symptoms. If symptoms are present, they may include:  Pelvic pain or pressure.  Pain in the lower abdomen.  Pain during sexual intercourse.  Increasing girth (swelling) of the abdomen.  Abnormal menstrual periods.  Increasing pain with menstrual periods.  Stopping having menstrual periods without being pregnant. DIAGNOSIS  These cysts are commonly found during a routine or annual pelvic exam. Tests may be ordered to find out more about the cyst. These tests may include:  Ultrasound.  X-ray of the pelvis.  CT scan.  MRI.  Blood tests. TREATMENT  Many ovarian cysts go away on their own without treatment. Your health care provider may want to check your cyst regularly for 2-3 months to see if it changes. For women in menopause, it is particularly important to monitor a cyst closely because of the higher rate of ovarian cancer in menopausal women. When treatment is needed, it may include any of the following:  A procedure to drain the cyst (aspiration). This may be done using a long needle and ultrasound. It can also be done through a laparoscopic procedure. This involves using a thin, lighted tube with a tiny camera on the end (laparoscope) inserted through a small incision.  Surgery to remove the whole cyst. This may be done using laparoscopic surgery or an open surgery involving a larger incision in the lower abdomen.  Hormone treatment or birth control pills. These methods are sometimes used to help dissolve a cyst. HOME CARE INSTRUCTIONS   Only take over-the-counter   or prescription medicines as directed by your health care provider.  Follow up with your health care provider as directed.  Get regular pelvic exams and Pap tests. SEEK MEDICAL CARE IF:   Your periods are late, irregular, or painful, or they stop.  Your pelvic pain or abdominal pain does not go away.  Your abdomen becomes  larger or swollen.  You have pressure on your bladder or trouble emptying your bladder completely.  You have pain during sexual intercourse.  You have feelings of fullness, pressure, or discomfort in your stomach.  You lose weight for no apparent reason.  You feel generally ill.  You become constipated.  You lose your appetite.  You develop acne.  You have an increase in body and facial hair.  You are gaining weight, without changing your exercise and eating habits.  You think you are pregnant. SEEK IMMEDIATE MEDICAL CARE IF:   You have increasing abdominal pain.  You feel sick to your stomach (nauseous), and you throw up (vomit).  You develop a fever that comes on suddenly.  You have abdominal pain during a bowel movement.  Your menstrual periods become heavier than usual. MAKE SURE YOU:  Understand these instructions.  Will watch your condition.  Will get help right away if you are not doing well or get worse.   This information is not intended to replace advice given to you by your health care provider. Make sure you discuss any questions you have with your health care provider.   Document Released: 07/10/2005 Document Revised: 07/15/2013 Document Reviewed: 03/17/2013 Elsevier Interactive Patient Education 2016 [employer]  

## 2016-01-27 LAB — CA 125: CA 125: 18 U/mL (ref <35)

## 2016-01-31 MED FILL — KLOR-CON M20 TABLET: 20 | 90 days supply | Qty: 90 | Fill #0

## 2016-02-01 ENCOUNTER — Encounter: Payer: Self-pay | Admitting: Oncology

## 2016-02-09 ENCOUNTER — Telehealth: Payer: Self-pay | Admitting: *Deleted

## 2016-02-09 NOTE — Telephone Encounter (Signed)
New Rx for compression sleeves mailed to pt.

## 2016-02-09 NOTE — Telephone Encounter (Signed)
"  I have not received prescription for compression sleeve.  My office is now on [facility] and I can come pick it up if needed.  Please call."

## 2016-02-16 MED FILL — KLOR-CON M20 TABLET: 20 | 40 days supply | Qty: 40 | Fill #1

## 2016-02-19 IMAGING — CR DG CHEST 2V
2 series · 2 of 2 positions shown · non-contrast
Comparison: 07/15/2012.

CLINICAL DATA: Right-sided chest pain and shortness of breath.
Cough, breast cancer.

EXAM:
CHEST  2 VIEW

[w chest pa]
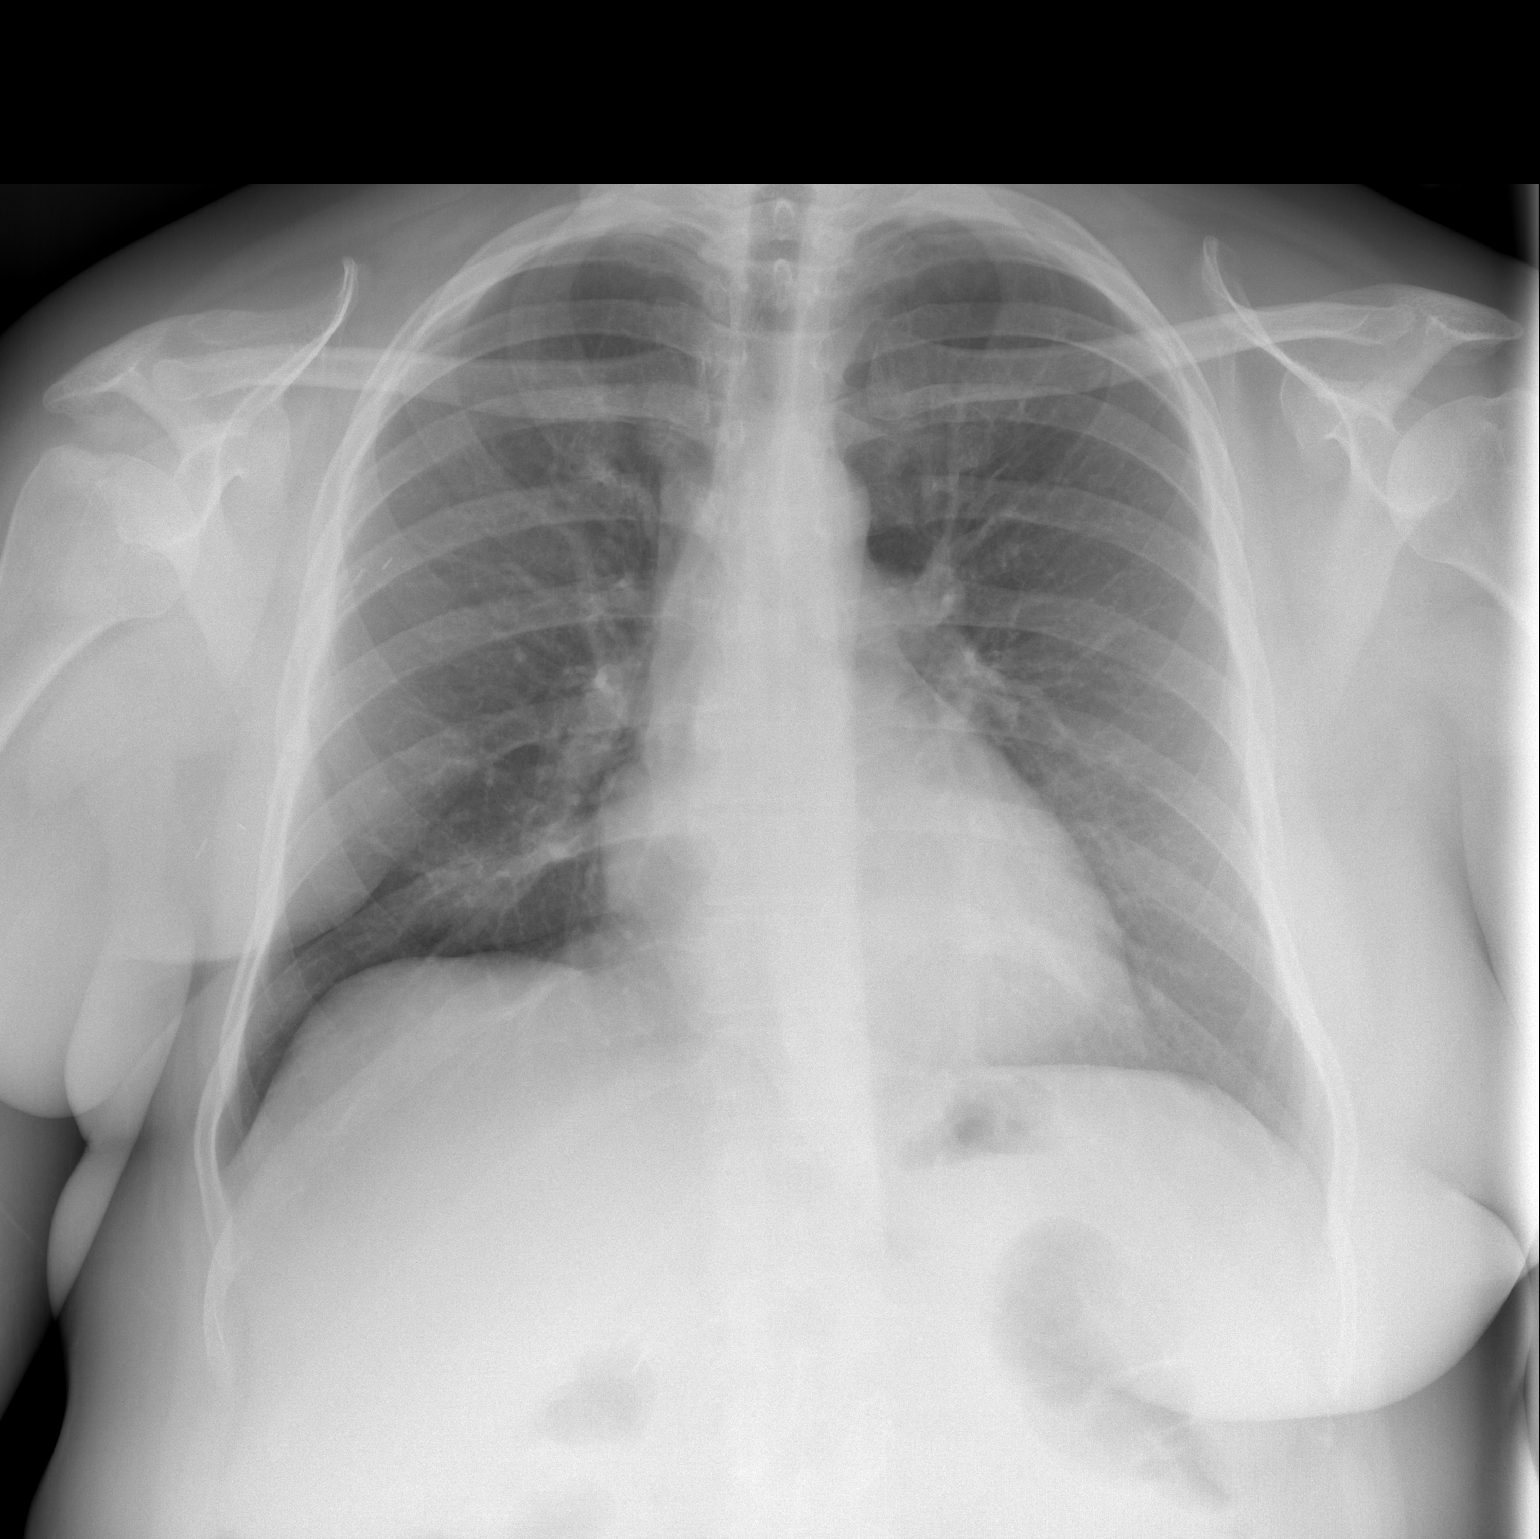

[w chest lat]
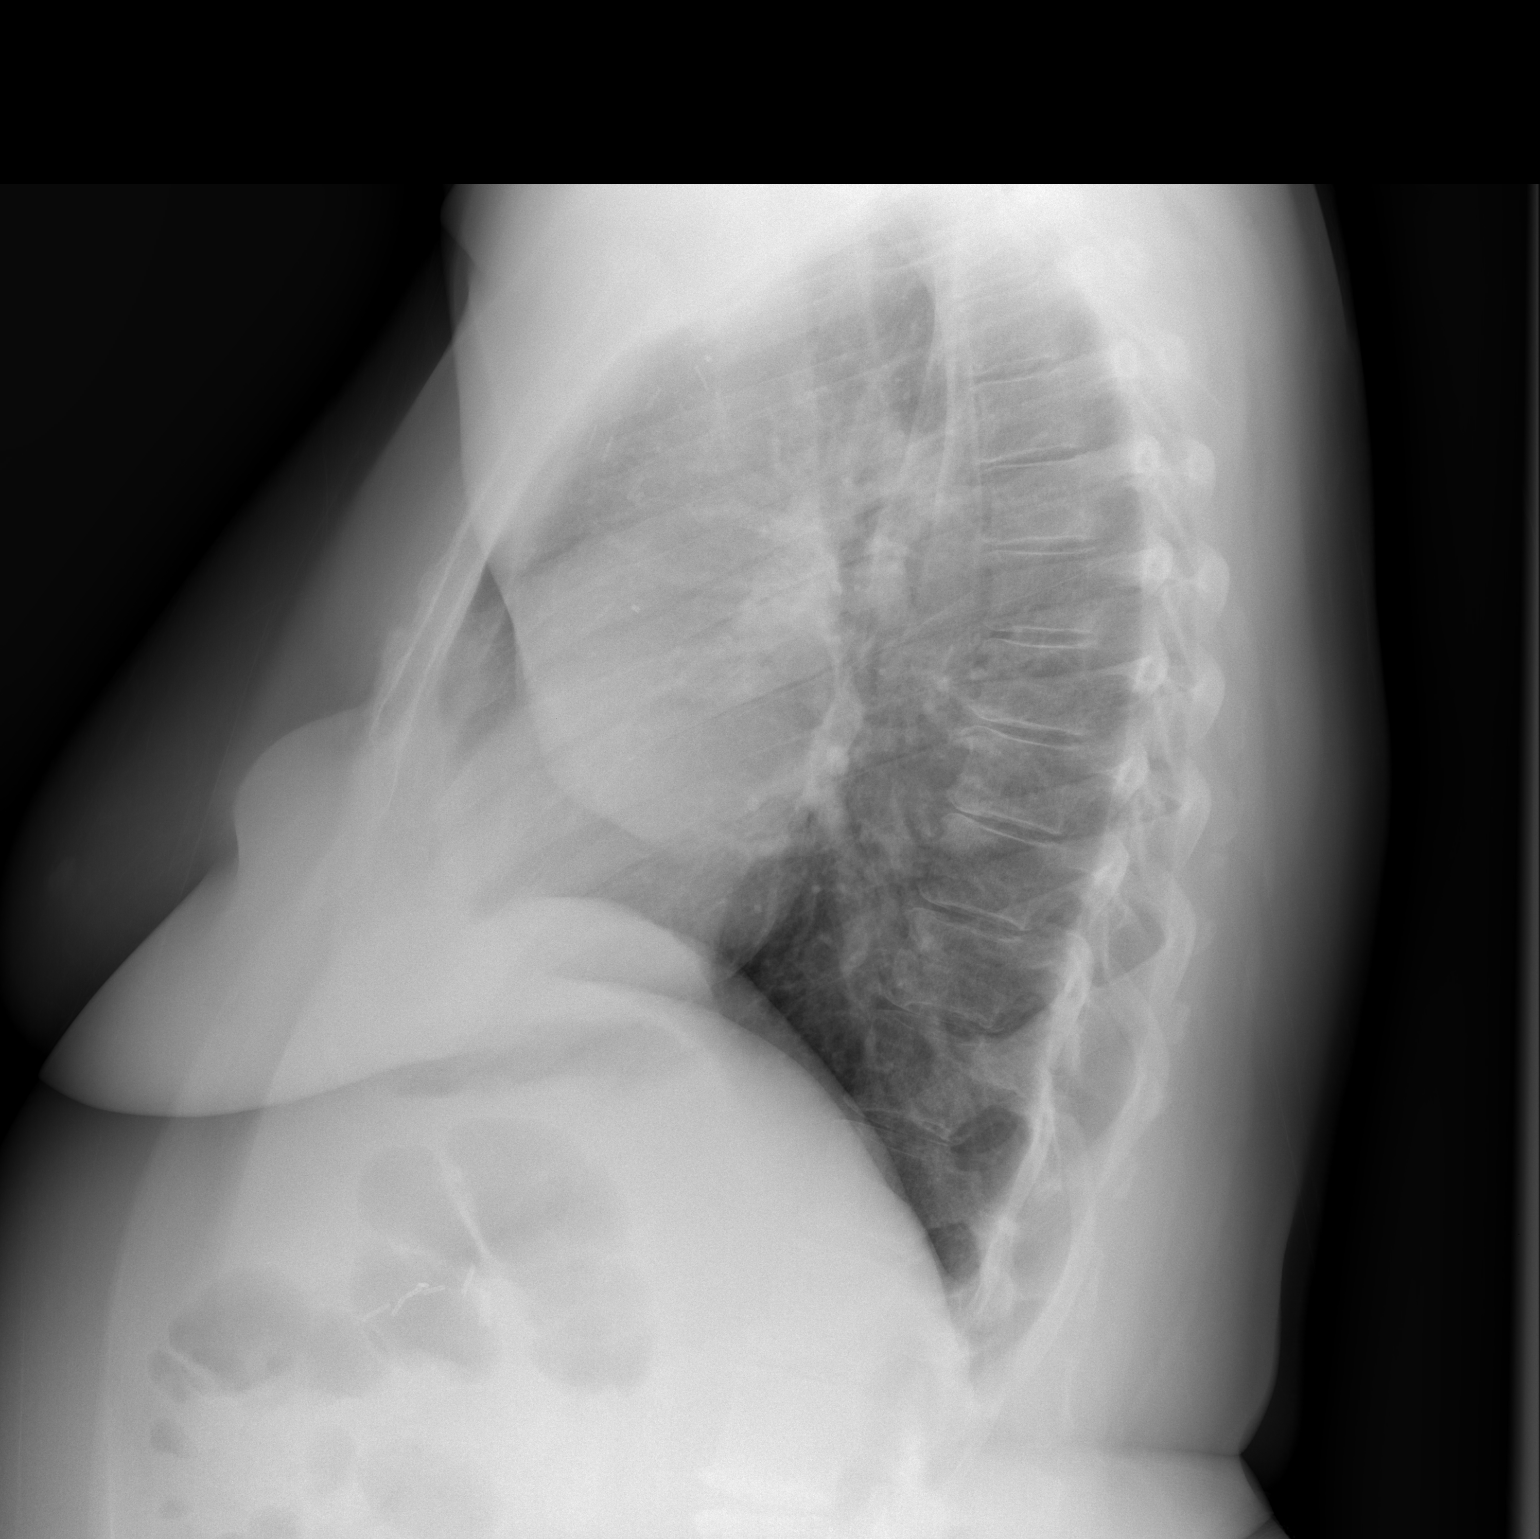

[2 of 2 positions shown; findings below may reference images not displayed]

FINDINGS: Trachea is midline. Heart size stable. Lungs are clear. No pleural
fluid. Postoperative changes of right mastectomy and right axillary
lymph node dissection are noted.
IMPRESSION: No acute findings.

## 2016-03-15 MED FILL — ZOLPIDEM TARTRATE 10 MG TAB: 10 | 90 days supply | Qty: 90 | Fill #0

## 2016-03-19 DIAGNOSIS — B029 Zoster without complications: Secondary | ICD-10-CM | POA: Diagnosis not present

## 2016-03-20 MED FILL — TAMOXIFEN 20 MG TABLET: 20 | 90 days supply | Qty: 90 | Fill #1

## 2016-03-28 MED FILL — LISINOPRIL 40 MG TABLET: 40 | 30 days supply | Qty: 30 | Fill #0

## 2016-03-30 MED FILL — KLOR-CON M20 TABLET: 20 | 50 days supply | Qty: 50 | Fill #2

## 2016-04-10 MED FILL — HYDROCHLOROTHIAZIDE 25 MG T: 25 | 30 days supply | Qty: 30 | Fill #0

## 2016-04-25 MED FILL — ESCITALOPRAM 10 MG TABLET: 10 | 90 days supply | Qty: 90 | Fill #0

## 2016-04-25 MED FILL — LISINOPRIL 40 MG TABLET: 40 | 90 days supply | Qty: 90 | Fill #0

## 2016-05-08 DIAGNOSIS — R5383 Other fatigue: Secondary | ICD-10-CM | POA: Diagnosis not present

## 2016-05-08 DIAGNOSIS — Z1159 Encounter for screening for other viral diseases: Secondary | ICD-10-CM | POA: Diagnosis not present

## 2016-05-08 DIAGNOSIS — C50911 Malignant neoplasm of unspecified site of right female breast: Secondary | ICD-10-CM | POA: Diagnosis not present

## 2016-05-08 DIAGNOSIS — G4700 Insomnia, unspecified: Secondary | ICD-10-CM | POA: Diagnosis not present

## 2016-05-08 DIAGNOSIS — F324 Major depressive disorder, single episode, in partial remission: Secondary | ICD-10-CM | POA: Diagnosis not present

## 2016-05-08 DIAGNOSIS — I10 Essential (primary) hypertension: Secondary | ICD-10-CM | POA: Diagnosis not present

## 2016-05-09 MED FILL — KLOR-CON M20 TABLET: 20 | 90 days supply | Qty: 90 | Fill #0

## 2016-05-09 MED FILL — HYDROCHLOROTHIAZIDE 25 MG T: 25 | 90 days supply | Qty: 90 | Fill #0

## 2016-05-09 MED FILL — BUPROPION HCL XL 150 MG TAB: 150 | 30 days supply | Qty: 60 | Fill #0

## 2016-06-09 MED FILL — BUPROPION HCL XL 150 MG TAB: 150 | 15 days supply | Qty: 30 | Fill #0

## 2016-06-12 MED FILL — ZOLPIDEM TARTRATE 10 MG TAB: 10 | 90 days supply | Qty: 90 | Fill #0

## 2016-06-20 DIAGNOSIS — E6609 Other obesity due to excess calories: Secondary | ICD-10-CM | POA: Diagnosis not present

## 2016-06-20 DIAGNOSIS — F325 Major depressive disorder, single episode, in full remission: Secondary | ICD-10-CM | POA: Diagnosis not present

## 2016-06-21 MED FILL — TAMOXIFEN 20 MG TABLET: 20 | 90 days supply | Qty: 90 | Fill #2

## 2016-06-22 ENCOUNTER — Encounter: Payer: Self-pay | Admitting: Women's Health

## 2016-06-22 ENCOUNTER — Ambulatory Visit (INDEPENDENT_AMBULATORY_CARE_PROVIDER_SITE_OTHER): Payer: 59 | Admitting: Women's Health

## 2016-06-22 VITALS — BP 124/84 | Ht 66.25 in | Wt 249.0 lb

## 2016-06-22 DIAGNOSIS — Z01419 Encounter for gynecological examination (general) (routine) without abnormal findings: Secondary | ICD-10-CM

## 2016-06-22 MED FILL — BUPROPION HCL XL 150 MG TAB: 150 | 90 days supply | Qty: 90 | Fill #0

## 2016-06-22 NOTE — Patient Instructions (Signed)
Health  Health Maintenance for Postmenopausal Women Introduction Menopause is a normal process in which your reproductive ability comes to an end. This process happens gradually over a span of months to years, usually between the ages of 30 and 85. Menopause is complete when you have missed 12 consecutive menstrual periods. It is important to talk with your health care provider about some of the most common conditions that affect postmenopausal women, such as heart disease, cancer, and bone loss (osteoporosis). Adopting a healthy lifestyle and getting preventive care can help to promote your health and wellness. Those actions can also lower your chances of developing some of these common conditions. What should I know about menopause? During menopause, you may experience a number of symptoms, such as:  Moderate-to-severe hot flashes.  Night sweats.  Decrease in sex drive.  Mood swings.  Headaches.  Tiredness.  Irritability.  Memory problems.  Insomnia. Choosing to treat or not to treat menopausal changes is an individual decision that you make with your health care provider. What should I know about hormone replacement therapy and supplements? Hormone therapy products are effective for treating symptoms that are associated with menopause, such as hot flashes and night sweats. Hormone replacement carries certain risks, especially as you become older. If you are thinking about using estrogen or estrogen with progestin treatments, discuss the benefits and risks with your health care provider. What should I know about heart disease and stroke? Heart disease, heart attack, and stroke become more likely as you age. This may be due, in part, to the hormonal changes that your body experiences during menopause. These can affect how your body processes dietary fats, triglycerides, and cholesterol. Heart attack and stroke are both medical emergencies. There are many things that you can do to help  prevent heart disease and stroke:  Have your blood pressure checked at least every 1-2 years. High blood pressure causes heart disease and increases the risk of stroke.  If you are 17-36 years old, ask your health care provider if you should take aspirin to prevent a heart attack or a stroke.  Do not use any tobacco products, including cigarettes, chewing tobacco, or electronic cigarettes. If you need help quitting, ask your health care provider.  It is important to eat a healthy diet and maintain a healthy weight.  Be sure to include plenty of vegetables, fruits, low-fat dairy products, and lean protein.  Avoid eating foods that are high in solid fats, added sugars, or salt (sodium).  Get regular exercise. This is one of the most important things that you can do for your health.  Try to exercise for at least 150 minutes each week. The type of exercise that you do should increase your heart rate and make you sweat. This is known as moderate-intensity exercise.  Try to do strengthening exercises at least twice each week. Do these in addition to the moderate-intensity exercise.  Know your numbers.Ask your health care provider to check your cholesterol and your blood glucose. Continue to have your blood tested as directed by your health care provider. What should I know about cancer screening? There are several types of cancer. Take the following steps to reduce your risk and to catch any cancer development as early as possible. Breast Cancer  Practice breast self-awareness.  This means understanding how your breasts normally appear and feel.  It also means doing regular breast self-exams. Let your health care provider know about any changes, no matter how small.  If you are  59 or older, have a clinician do a breast exam (clinical breast exam or CBE) every year. Depending on your age, family history, and medical history, it may be recommended that you also have a yearly breast X-ray  (mammogram).  If you have a family history of breast cancer, talk with your health care provider about genetic screening.  If you are at high risk for breast cancer, talk with your health care provider about having an MRI and a mammogram every year.  Breast cancer (BRCA) gene test is recommended for women who have family members with BRCA-related cancers. Results of the assessment will determine the need for genetic counseling and BRCA1 and for BRCA2 testing. BRCA-related cancers include these types:  Breast. This occurs in males or females.  Ovarian.  Tubal. This may also be called fallopian tube cancer.  Cancer of the abdominal or pelvic lining (peritoneal cancer).  Prostate.  Pancreatic. Cervical, Uterine, and Ovarian Cancer  Your health care provider may recommend that you be screened regularly for cancer of the pelvic organs. These include your ovaries, uterus, and vagina. This screening involves a pelvic exam, which includes checking for microscopic changes to the surface of your cervix (Pap test).  For women ages 21-65, health care providers may recommend a pelvic exam and a Pap test every three years. For women ages 46-65, they may recommend the Pap test and pelvic exam, combined with testing for human papilloma virus (HPV), every five years. Some types of HPV increase your risk of cervical cancer. Testing for HPV may also be done on women of any age who have unclear Pap test results.  Other health care providers may not recommend any screening for nonpregnant women who are considered low risk for pelvic cancer and have no symptoms. Ask your health care provider if a screening pelvic exam is right for you.  If you have had past treatment for cervical cancer or a condition that could lead to cancer, you need Pap tests and screening for cancer for at least 20 years after your treatment. If Pap tests have been discontinued for you, your risk factors (such as having a new sexual  partner) need to be reassessed to determine if you should start having screenings again. Some women have medical problems that increase the chance of getting cervical cancer. In these cases, your health care provider may recommend that you have screening and Pap tests more often.  If you have a family history of uterine cancer or ovarian cancer, talk with your health care provider about genetic screening.  If you have vaginal bleeding after reaching menopause, tell your health care provider.  There are currently no reliable tests available to screen for ovarian cancer. Lung Cancer  Lung cancer screening is recommended for adults 76-75 years old who are at high risk for lung cancer because of a history of smoking. A yearly low-dose CT scan of the lungs is recommended if you:  Currently smoke.  Have a history of at least 30 pack-years of smoking and you currently smoke or have quit within the past 15 years. A pack-year is smoking an average of one pack of cigarettes per day for one year. Yearly screening should:  Continue until it has been 15 years since you quit.  Stop if you develop a health problem that would prevent you from having lung cancer treatment. Colorectal Cancer  This type of cancer can be detected and can often be prevented.  Routine colorectal cancer screening usually begins at age  50 and continues through age 1.  If you have risk factors for colon cancer, your health care provider may recommend that you be screened at an earlier age.  If you have a family history of colorectal cancer, talk with your health care provider about genetic screening.  Your health care provider may also recommend using home test kits to check for hidden blood in your stool.  A small camera at the end of a tube can be used to examine your colon directly (sigmoidoscopy or colonoscopy). This is done to check for the earliest forms of colorectal cancer.  Direct examination of the colon should be  repeated every 5-10 years until age 17. However, if early forms of precancerous polyps or small growths are found or if you have a family history or genetic risk for colorectal cancer, you may need to be screened more often. Skin Cancer  Check your skin from head to toe regularly.  Monitor any moles. Be sure to tell your health care provider:  About any new moles or changes in moles, especially if there is a change in a mole's shape or color.  If you have a mole that is larger than the size of a pencil eraser.  If any of your family members has a history of skin cancer, especially at a young age, talk with your health care provider about genetic screening.  Always use sunscreen. Apply sunscreen liberally and repeatedly throughout the day.  Whenever you are outside, protect yourself by wearing long sleeves, pants, a wide-brimmed hat, and sunglasses. What should I know about osteoporosis? Osteoporosis is a condition in which bone destruction happens more quickly than new bone creation. After menopause, you may be at an increased risk for osteoporosis. To help prevent osteoporosis or the bone fractures that can happen because of osteoporosis, the following is recommended:  If you are 6-31 years old, get at least 1,000 mg of calcium and at least 600 mg of vitamin D per day.  If you are older than age 56 but younger than age 71, get at least 1,200 mg of calcium and at least 600 mg of vitamin D per day.  If you are older than age 93, get at least 1,200 mg of calcium and at least 800 mg of vitamin D per day. Smoking and excessive alcohol intake increase the risk of osteoporosis. Eat foods that are rich in calcium and vitamin D, and do weight-bearing exercises several times each week as directed by your health care provider. What should I know about how menopause affects my mental health? Depression may occur at any age, but it is more common as you become older. Common symptoms of depression  include:  Low or sad mood.  Changes in sleep patterns.  Changes in appetite or eating patterns.  Feeling an overall lack of motivation or enjoyment of activities that you previously enjoyed.  Frequent crying spells. Talk with your health care provider if you think that you are experiencing depression. What should I know about immunizations? It is important that you get and maintain your immunizations. These include:  Tetanus, diphtheria, and pertussis (Tdap) booster vaccine.  Influenza every year before the flu season begins.  Pneumonia vaccine.  Shingles vaccine. Your health care provider may also recommend other immunizations. This information is not intended to replace advice given to you by your health care provider. Make sure you discuss any questions you have with your health care provider. Document Released: 09/01/2005 Document Revised: 01/28/2016 Document Reviewed: 04/13/2015  2017 [employer]

## 2016-06-22 NOTE — Progress Notes (Signed)
[name] Gibson Nov 16, 1955 [id number]    History:    Presents for annual exam. Postmenopausal on no HRT with no bleeding. 209 right breast cancer had mastectomy, radiation and chemotherapy. Year 8 of Tamoxifen notes night sweats and hot flushes as side effects. Continues to struggle with right arm lymphedema. BRCA negative. 07/2008 tamoxifen started. 2013 DEXA T score +1.7 spine, +1.2 hip. 1988 CIN-3 cone with normal Paps after, last one in 2016. History of left ovarian cyst 69m , stable with low CA 125. Has declined oophorectomy in the past. Being monitored by ultrasound every 6 months  Past medical history, past surgical history, family history and social history were all reviewed and documented in the EPIC chart. Works at [employer] office. Husband was youth minister and now disabled due to [place] [employer] for friend to stay busy. Daughter healthy. Son went to kidney specialist for evaluation for hypertension, currently being watched.  ROS:  A ROS was performed and pertinent positives and negatives are included.  Exam:  Vitals:   06/22/16 1607  BP: 124/84  Weight: 249 lb (112.9 kg)  Height: 5' 6.25" (1.683 m)   Body mass index is 39.89 kg/m.   General appearance:  Normal Thyroid:  Symmetrical, normal in size, without palpable masses or nodularity. Respiratory  Auscultation:  Clear without wheezing or rhonchi Cardiovascular  Auscultation:  Regular rate, without rubs, murmurs or gallops  Edema/varicosities:  Not grossly evident Abdominal  Soft,nontender, without masses, guarding or rebound.  Liver/spleen:  No organomegaly noted  Hernia:  None appreciated  Skin  Inspection:  Grossly normal   Breasts: Examined lying and sitting.     Right: Without masses, retractions, discharge or axillary adenopathy.     Left: Without masses, retractions, discharge or axillary adenopathy. Gentitourinary   Inguinal/mons:  Normal without inguinal adenopathy  External genitalia:   Normal  BUS/Urethra/Skene's glands:  Normal  Vagina:  Normal  Cervix:  Normal  Uterus:  normal in size, shape and contour.  Midline and mobile  Adnexa/parametria:     Rt: Without masses or tenderness.   Lt: Without masses or tenderness.  Anus and perineum: Normal  Digital rectal exam: Normal sphincter tone without palpated masses or tenderness  Assessment/Plan:  60y.o.  M WF G2P2 for annual exam with no complaints.  2009 right breast cancer- mastectomy/radiation/chemotherapy BRCA negative on tamoxifen for 10 years/Dr Magrinat Right arm lymphedema, using compression sleeve 1988 CIN-3 conization normal after Left ovarian cyst stable, low Ca 125 Obesity Hypertension, anxiety/depression- meds and labs by PCP  Plan: SBE's, continue annual screening mammogram. Schedule colonoscopy for this year. Call in January to schedule follow-up ultrasound to evaluate the ovary cyst. Schedule DEXA next year. Fall prevention reviewed. Have PCP evaluate  vitamin D levels at next visit. Currently on Weight Wat[employer] and doing well. Pap normal 2016, will repeat next year.    YBerger 4:18 PM 06/22/2016

## 2016-06-26 ENCOUNTER — Other Ambulatory Visit: Payer: Self-pay | Admitting: *Deleted

## 2016-06-26 DIAGNOSIS — N83202 Unspecified ovarian cyst, left side: Secondary | ICD-10-CM

## 2016-07-18 MED FILL — ESCITALOPRAM 10 MG TABLET: 10 | 90 days supply | Qty: 90 | Fill #0

## 2016-07-26 MED FILL — LISINOPRIL 40 MG TABLET: 40 | 90 days supply | Qty: 90 | Fill #0

## 2016-07-28 DIAGNOSIS — H5203 Hypermetropia, bilateral: Secondary | ICD-10-CM | POA: Diagnosis not present

## 2016-08-07 MED FILL — KLOR-CON M20 TABLET: 20 | 90 days supply | Qty: 90 | Fill #1

## 2016-08-07 MED FILL — HYDROCHLOROTHIAZIDE 25 MG T: 25 | 90 days supply | Qty: 90 | Fill #1

## 2016-08-21 MED FILL — BUPROPION HCL XL 300 MG TAB: 300 | 90 days supply | Qty: 90 | Fill #0

## 2016-09-13 MED FILL — ZOLPIDEM TARTRATE 10 MG TAB: 10 | 90 days supply | Qty: 90 | Fill #0

## 2016-09-19 MED FILL — TAMOXIFEN 20 MG TABLET: 20 | 90 days supply | Qty: 90 | Fill #3

## 2016-09-21 DIAGNOSIS — R5383 Other fatigue: Secondary | ICD-10-CM | POA: Diagnosis not present

## 2016-09-21 DIAGNOSIS — M25542 Pain in joints of left hand: Secondary | ICD-10-CM | POA: Diagnosis not present

## 2016-09-21 DIAGNOSIS — F325 Major depressive disorder, single episode, in full remission: Secondary | ICD-10-CM | POA: Diagnosis not present

## 2016-09-21 DIAGNOSIS — M25541 Pain in joints of right hand: Secondary | ICD-10-CM | POA: Diagnosis not present

## 2016-09-21 DIAGNOSIS — E6609 Other obesity due to excess calories: Secondary | ICD-10-CM | POA: Diagnosis not present

## 2016-09-21 DIAGNOSIS — I10 Essential (primary) hypertension: Secondary | ICD-10-CM | POA: Diagnosis not present

## 2016-09-27 ENCOUNTER — Ambulatory Visit: Payer: 59 | Admitting: Women's Health

## 2016-09-27 ENCOUNTER — Other Ambulatory Visit: Payer: 59

## 2016-10-23 MED FILL — LISINOPRIL 40 MG TABLET: 40 | 90 days supply | Qty: 90 | Fill #1

## 2016-11-01 MED FILL — ESCITALOPRAM 10 MG TABLET: 10 | 90 days supply | Qty: 90 | Fill #1

## 2016-11-08 ENCOUNTER — Encounter: Payer: Self-pay | Admitting: Oncology

## 2016-11-08 DIAGNOSIS — Z853 Personal history of malignant neoplasm of breast: Secondary | ICD-10-CM | POA: Diagnosis not present

## 2016-11-08 DIAGNOSIS — Z1231 Encounter for screening mammogram for malignant neoplasm of breast: Secondary | ICD-10-CM | POA: Diagnosis not present

## 2016-11-09 MED FILL — POTASSIUM CL ER 20 MEQ TABL: 20 | 90 days supply | Qty: 90 | Fill #0

## 2016-11-09 MED FILL — HYDROCHLOROTHIAZIDE 25 MG T: 25 | 90 days supply | Qty: 90 | Fill #0

## 2016-11-20 MED FILL — BUPROPION HCL XL 300 MG TAB: 300 | 90 days supply | Qty: 90 | Fill #1

## 2016-12-06 ENCOUNTER — Encounter: Payer: Self-pay | Admitting: Gynecology

## 2016-12-11 ENCOUNTER — Other Ambulatory Visit (HOSPITAL_BASED_OUTPATIENT_CLINIC_OR_DEPARTMENT_OTHER): Payer: 59

## 2016-12-11 DIAGNOSIS — Z853 Personal history of malignant neoplasm of breast: Secondary | ICD-10-CM | POA: Diagnosis not present

## 2016-12-11 DIAGNOSIS — C50911 Malignant neoplasm of unspecified site of right female breast: Secondary | ICD-10-CM | POA: Diagnosis not present

## 2016-12-11 LAB — CBC WITH DIFFERENTIAL/PLATELET
BASO%: 0.6 % (ref 0.0–2.0)
Basophils Absolute: 0.1 10*3/uL (ref 0.0–0.1)
EOS%: 3 % (ref 0.0–7.0)
Eosinophils Absolute: 0.3 10*3/uL (ref 0.0–0.5)
HEMATOCRIT: 39.2 % (ref 34.8–46.6)
HEMOGLOBIN: 13.4 g/dL (ref 11.6–15.9)
LYMPH%: 30 % (ref 14.0–49.7)
MCH: 31.6 pg (ref 25.1–34.0)
MCHC: 34.2 g/dL (ref 31.5–36.0)
MCV: 92.5 fL (ref 79.5–101.0)
MONO#: 0.8 10*3/uL (ref 0.1–0.9)
MONO%: 8.1 % (ref 0.0–14.0)
NEUT%: 58.3 % (ref 38.4–76.8)
NEUTROS ABS: 6 10*3/uL (ref 1.5–6.5)
PLATELETS: 265 10*3/uL (ref 145–400)
RBC: 4.24 10*6/uL (ref 3.70–5.45)
RDW: 12.7 % (ref 11.2–14.5)
WBC: 10.2 10*3/uL (ref 3.9–10.3)
lymph#: 3.1 10*3/uL (ref 0.9–3.3)

## 2016-12-11 LAB — COMPREHENSIVE METABOLIC PANEL
ALT: 16 U/L (ref 0–55)
AST: 16 U/L (ref 5–34)
Albumin: 3.5 g/dL (ref 3.5–5.0)
Alkaline Phosphatase: 89 U/L (ref 40–150)
Anion Gap: 10 mEq/L (ref 3–11)
BUN: 14.1 mg/dL (ref 7.0–26.0)
CALCIUM: 8.8 mg/dL (ref 8.4–10.4)
CO2: 28 mEq/L (ref 22–29)
CREATININE: 0.8 mg/dL (ref 0.6–1.1)
Chloride: 103 mEq/L (ref 98–109)
EGFR: 80 mL/min/{1.73_m2} — ABNORMAL LOW (ref >90)
Glucose: 106 mg/dl (ref 70–140)
Potassium: 3.5 mEq/L (ref 3.5–5.1)
Sodium: 141 mEq/L (ref 136–145)
TOTAL PROTEIN: 6.6 g/dL (ref 6.4–8.3)

## 2016-12-12 LAB — CANCER ANTIGEN 27.29: CAN 27.29: 24 U/mL (ref 0.0–38.6)

## 2016-12-12 MED FILL — ZOLPIDEM TARTRATE 10 MG TAB: 10 | 90 days supply | Qty: 90 | Fill #0

## 2016-12-18 ENCOUNTER — Encounter: Payer: 59 | Admitting: Nurse Practitioner

## 2016-12-19 ENCOUNTER — Encounter: Payer: Self-pay | Admitting: Adult Health

## 2016-12-19 ENCOUNTER — Other Ambulatory Visit: Payer: Self-pay | Admitting: Oncology

## 2016-12-19 ENCOUNTER — Ambulatory Visit (HOSPITAL_BASED_OUTPATIENT_CLINIC_OR_DEPARTMENT_OTHER): Payer: 59 | Admitting: Adult Health

## 2016-12-19 VITALS — BP 139/71 | HR 85 | Temp 97.8°F | Resp 20 | Ht 66.25 in | Wt 250.4 lb

## 2016-12-19 DIAGNOSIS — Z170 Estrogen receptor positive status [ER+]: Secondary | ICD-10-CM

## 2016-12-19 DIAGNOSIS — I890 Lymphedema, not elsewhere classified: Secondary | ICD-10-CM

## 2016-12-19 DIAGNOSIS — M25519 Pain in unspecified shoulder: Secondary | ICD-10-CM | POA: Diagnosis not present

## 2016-12-19 DIAGNOSIS — C50911 Malignant neoplasm of unspecified site of right female breast: Secondary | ICD-10-CM

## 2016-12-19 DIAGNOSIS — Z1239 Encounter for other screening for malignant neoplasm of breast: Secondary | ICD-10-CM

## 2016-12-19 DIAGNOSIS — Z853 Personal history of malignant neoplasm of breast: Secondary | ICD-10-CM

## 2016-12-19 DIAGNOSIS — Z79810 Long term (current) use of selective estrogen receptor modulators (SERMs): Secondary | ICD-10-CM

## 2016-12-19 MED ORDER — PROCHLORPERAZINE MALEATE 10 MG PO TABS: 10.0000 mg | ORAL_TABLET | Freq: Four times a day (QID) | ORAL | 0 refills | Status: DC | PRN

## 2016-12-19 MED FILL — TAMOXIFEN 20 MG TABLET: 20 | 90 days supply | Qty: 90 | Fill #0

## 2016-12-19 MED FILL — PROCHLORPERAZINE 10 MG TAB: 10 | 8 days supply | Qty: 30 | Fill #0

## 2016-12-19 NOTE — Progress Notes (Signed)
CLINIC:  Survivorship   REASON FOR VISIT:  Routine follow-up for history of breast cancer.   BRIEF ONCOLOGIC HISTORY:   Per Dr. Virgie Dad last note:   61 y.o.  [place] woman   (1) status post right modified radical mastectomy in May 2009 for a multifocal/multicentric breast carcinoma, the major component being lobular with a minor ductal component, pathologically T2 N1, Stage IIB, grade 2, estrogen and progesterone receptor positive, HER2 negative, with a low MIB-1,  (2) treated adjuvantly with dose-dense doxorubicin/ cyclophosphamide x4 followed by weekly paclitaxel x12, completed October 2009,   (3) radiation completed on December 2009   (4) started tamoxifen December 2009.  the goal is to continue on tamoxifen for a total of 10 years, until December 2019.  (4)  lymphedema, right upper extremity    INTERVAL HISTORY:  [name] Gibson presents to the [facility] today for routine follow-up for her history of breast cancer.  Overall, she reports feeling quite well.  She has had some shoulder pain x 3-4 months.  She describes it as a tightness, and no pain on the shoulder, but worse when she is lying on a float in her pool.    She tans in the tanning bed due to psoriasis and has a fh of melanoma.  She has not yet seen a dermatologist for a skin evaluation, but is planning to.  She continues to take Tamoxifen daily and does have hot flashes that she is managing.  She continues to see Elon Alas at [facility] every year.  She is currently taking wellbutrin, however is tapering off of this as it has contributed to vivid dreams.  She continues to struggle with right arm lymphedema, and wears a sleeve.  She does manual drainage of this occasionally and has a machine she doesn't use often because it is complicated for her to use.  She continues to follow with her pcp at minimum annually, and is swimming and walking some, though it is not regular.  She is otherwise doing  well and without any questions or concerns.      REVIEW OF SYSTEMS:  Review of Systems  Constitutional: Negative for appetite change, chills, diaphoresis, fatigue, fever and unexpected weight change.  HENT:   Negative for hearing loss and lump/mass.   Eyes: Negative for eye problems and icterus.  Respiratory: Negative for chest tightness, cough and shortness of breath.   Cardiovascular: Negative for chest pain, leg swelling and palpitations.  Gastrointestinal: Negative for abdominal distention and abdominal pain.  Endocrine: Positive for hot flashes.  Genitourinary: Negative for difficulty urinating and vaginal bleeding.   Musculoskeletal: Negative for arthralgias.  Skin: Negative for itching and rash.  Neurological: Negative for dizziness, extremity weakness and headaches.  Hematological: Negative for adenopathy. Does not bruise/bleed easily.  Psychiatric/Behavioral: Negative for depression. The patient is not nervous/anxious.   Breast: Denies any new nodularity, masses, tenderness, nipple changes, or nipple discharge.      PAST MEDICAL/SURGICAL HISTORY:  Past Medical History:  Diagnosis Date  . Cellulitis of arm, right 08/21/2013  . Complication of anesthesia    some nausea after second mastectomy surgery  . Hypertension   . Ovarian cyst   . Pituitary microadenoma ([place])   . Psoriasis    Past Surgical History:  Procedure Laterality Date  . Anal Fistula Repair    . BREAST SURGERY     Right Mastectomy  . CERVICAL CONE BIOPSY    . CHOLECYSTECTOMY    . COLPOSCOPY    .  TONSILLECTOMY       ALLERGIES:  Allergies  Allergen Reactions  . Sulfa Antibiotics Hives     CURRENT MEDICATIONS:  Outpatient Encounter Prescriptions as of 12/19/2016  Medication Sig Note  . buPROPion (WELLBUTRIN XL) 150 MG 24 hr tablet  06/22/2016: Received from: External Pharmacy  . Calcium Carbonate-Vitamin D (CALCIUM + D PO) Take 1 tablet by mouth 2 (two) times daily.    Marland Kitchen escitalopram (LEXAPRO)  10 MG tablet Take 1 tablet (10 mg total) by mouth daily.   . hydrochlorothiazide (HYDRODIURIL) 25 MG tablet Take 25 mg by mouth daily.   Marland Kitchen lisinopril (PRINIVIL,ZESTRIL) 40 MG tablet Take 40 mg by mouth daily.   . potassium chloride SA (K-DUR,KLOR-CON) 20 MEQ tablet Take 20 mEq by mouth daily.   . tamoxifen (NOLVADEX) 20 MG tablet Take 1 tablet (20 mg total) by mouth daily.   Marland Kitchen zolpidem (AMBIEN) 5 MG tablet Take 5 mg by mouth at bedtime as needed.    No facility-administered encounter medications on file as of 12/19/2016.      ONCOLOGIC FAMILY HISTORY:  Family History  Problem Relation Age of Onset  . Breast cancer Cousin 50       passed away form disease  . Breast cancer Cousin   . Hypertension Mother   . Melanoma Mother   . Diabetes Father   . Hypertension Father   . Heart disease Father   . Parkinsonism Father   . COPD Brother     GENETIC COUNSELING/TESTING:   SOCIAL HISTORY:  [name] Gibson is married and lives with her husband in [place], [place]. Ms. Barrientes is currently working full time at [employer].  She denies any current or history of tobacco, alcohol, or illicit drug use.     PHYSICAL EXAMINATION:  Vital Signs: There were no vitals filed for this visit. There were no vitals filed for this visit. General: Well-nourished, well-appearing female in no acute distress.  Accompanied by her husband today.   HEENT: Head is normocephalic.  Pupils equal and reactive to light. Conjunctivae clear without exudate.  Sclerae anicteric. Oral mucosa is pink, moist.  Oropharynx is pink without lesions or erythema.  Lymph: No cervical, supraclavicular, or infraclavicular lymphadenopathy noted on palpation.  Cardiovascular: Regular rate and rhythm.Marland Kitchen Respiratory: Clear to auscultation bilaterally. Chest expansion symmetric; breathing non-labored.  Breast Exam:  -Left breast: No appreciable masses on palpation. No skin redness, thickening, or peau d'orange appearance; no  nipple retraction or nipple discharge;  -Right breast: surgically absent, well healed mastectomy site, no nodularity, skin changes or swelling noted -Axilla: No axillary adenopathy bilaterally.  GI: Abdomen soft and round; non-tender, non-distended. Bowel sounds normoactive. No hepatosplenomegaly.   GU: Deferred.  Neuro: No focal deficits. Steady gait.  Psych: Mood and affect normal and appropriate for situation.  Extremities: No edema. Skin: Warm and dry.  LABORATORY DATA:  No visits with results within 1 Day(s) from this visit.  Latest known visit with results is:  Appointment on 12/11/2016  Component Date Value Ref Range Status  . CA 27.29 12/11/2016 24.0  0.0 - 38.6 U/mL Final   Research scientist (life sciences)  . WBC 12/11/2016 10.2  3.9 - 10.3 10e3/uL Final  . NEUT# 12/11/2016 6.0  1.5 - 6.5 10e3/uL Final  . HGB 12/11/2016 13.4  11.6 - 15.9 g/dL Final  . HCT 12/11/2016 39.2  34.8 - 46.6 % Final  . Platelets 12/11/2016 265  145 - 400 10e3/uL Final  . MCV 12/11/2016 92.5  79.5 -  101.0 fL Final  . MCH 12/11/2016 31.6  25.1 - 34.0 pg Final  . MCHC 12/11/2016 34.2  31.5 - 36.0 g/dL Final  . RBC 12/11/2016 4.24  3.70 - 5.45 10e6/uL Final  . RDW 12/11/2016 12.7  11.2 - 14.5 % Final  . lymph# 12/11/2016 3.1  0.9 - 3.3 10e3/uL Final  . MONO# 12/11/2016 0.8  0.1 - 0.9 10e3/uL Final  . Eosinophils Absolute 12/11/2016 0.3  0.0 - 0.5 10e3/uL Final  . Basophils Absolute 12/11/2016 0.1  0.0 - 0.1 10e3/uL Final  . NEUT% 12/11/2016 58.3  38.4 - 76.8 % Final  . LYMPH% 12/11/2016 30.0  14.0 - 49.7 % Final  . MONO% 12/11/2016 8.1  0.0 - 14.0 % Final  . EOS% 12/11/2016 3.0  0.0 - 7.0 % Final  . BASO% 12/11/2016 0.6  0.0 - 2.0 % Final  . Sodium 12/11/2016 141  136 - 145 mEq/L Final  . Potassium 12/11/2016 3.5  3.5 - 5.1 mEq/L Final  . Chloride 12/11/2016 103  98 - 109 mEq/L Final  . CO2 12/11/2016 28  22 - 29 mEq/L Final  . Glucose 12/11/2016 106  70 - 140 mg/dl Final   Glucose reference range  is for nonfasting patients. Fasting glucose reference range is 70- 100.  Marland Kitchen BUN 12/11/2016 14.1  7.0 - 26.0 mg/dL Final  . Creatinine 12/11/2016 0.8  0.6 - 1.1 mg/dL Final  . Total Bilirubin 12/11/2016 <0.22  0.20 - 1.20 mg/dL Final  . Alkaline Phosphatase 12/11/2016 89  40 - 150 U/L Final  . AST 12/11/2016 16  5 - 34 U/L Final  . ALT 12/11/2016 16  0 - 55 U/L Final  . Total Protein 12/11/2016 6.6  6.4 - 8.3 g/dL Final  . Albumin 12/11/2016 3.5  3.5 - 5.0 g/dL Final  . Calcium 12/11/2016 8.8  8.4 - 10.4 mg/dL Final  . Anion Gap 12/11/2016 10  3 - 11 mEq/L Final  . EGFR 12/11/2016 80* >90 ml/min/1.73 m2 Final   eGFR is calculated using the CKD-EPI Creatinine Equation (2009)     DIAGNOSTIC IMAGING:  Most recent mammogram:      ASSESSMENT AND PLAN:  [name] Gibson is a pleasant 61 y.o. female with history of Stage IIB right breast invasive lobular (minor ductal component) carcinoma, ER+/PR+/HER2-, diagnosed in May, 2009, treated with right modified radical mastectomy, adjuvant chemotherapy, adjuvant radiation, and Tamoxifen daily starting in 06/2018, with goal of 10 years of treatment.  She presents to the [facility]orship [facility] for surveillance and routine follow-up.   1. History of breast cancer:  [name] Gibson is currently clinically and radiographically without evidence of disease or recurrence of breast cancer.  She had labs done and they were normal as well.  I reviewed her labs above with her in detail and gave her a copy of them.   She will be due for mammogram in 10/2017; orders placed today.  She will continue her anti-estrogen therapy with Tamoxifen, with plans to continue for 10 years.  She will return to the cancer center to see her medical oncologist, Dr. Jana Hakim, in 11/2017.  I encouraged her to call me with any questions or concerns before her next visit at the cancer center, and I would be happy to see her sooner, if needed.    2. Right arm lymphedema: She will continue to wear her  sleeve.  I am happy to refer back to PT if she ever wants another referral.    3. Shoulder pain: This  sounds musculoskeletal.  She will see her PCP next month, I encouraged her if it still bothering her then to have her PCP image and evaluate it.    4. Bone health:  Given [name] Gibson's age, history of breast cancer, she is at slight risk of bone demineralization.  We reviewed bone health including calcium intake and weight bearing exercises today.    5. Cancer screening:  Due to [name] Gibson history and her age, she should receive screening for skin cancers, colon cancer, and gynecologic cancers. She was encouraged to follow-up with her PCP for appropriate cancer screenings.   6. Health maintenance and wellness promotion: [name] Gibson was encouraged to consume 5-7 servings of fruits and vegetables per day. She was also encouraged to engage in moderate to vigorous exercise for 30 minutes per day most days of the week. She was instructed to limit her alcohol consumption and continue to abstain from tobacco use.    Dispo:  -Return to cancer center in one year for follow up with Dr. Jana Hakim -Mammogram in 10/2017 when due   A total of (30) minutes of face-to-face time was spent with this patient with greater than 50% of that time in counseling and care-coordination.   Gardenia Phlegm, NP Survivorship Program [facility] [phone]   Note: PRIMARY CARE PROVIDER Harlan Stains, [place] [phone]

## 2017-01-17 DIAGNOSIS — L821 Other seborrheic keratosis: Secondary | ICD-10-CM | POA: Diagnosis not present

## 2017-01-17 DIAGNOSIS — Z1283 Encounter for screening for malignant neoplasm of skin: Secondary | ICD-10-CM | POA: Diagnosis not present

## 2017-01-22 DIAGNOSIS — M25511 Pain in right shoulder: Secondary | ICD-10-CM | POA: Diagnosis not present

## 2017-01-22 DIAGNOSIS — I10 Essential (primary) hypertension: Secondary | ICD-10-CM | POA: Diagnosis not present

## 2017-01-22 DIAGNOSIS — M25512 Pain in left shoulder: Secondary | ICD-10-CM | POA: Diagnosis not present

## 2017-01-22 DIAGNOSIS — F325 Major depressive disorder, single episode, in full remission: Secondary | ICD-10-CM | POA: Diagnosis not present

## 2017-01-22 MED FILL — BUPROPION HCL SR 100 MG TAB: 100 | 90 days supply | Qty: 90 | Fill #0

## 2017-01-22 MED FILL — LISINOPRIL 40 MG TAB: 40 | 90 days supply | Qty: 90 | Fill #0

## 2017-01-22 MED FILL — NAPROXEN 500 MG TABLET: 500 | 30 days supply | Qty: 60 | Fill #0

## 2017-01-29 MED FILL — ESCITALOPRAM 10 MG TABLET: 10 | 90 days supply | Qty: 90 | Fill #0

## 2017-02-08 MED FILL — HYDROCHLOROTHIAZIDE 25 MG T: 25 | 90 days supply | Qty: 90 | Fill #1

## 2017-02-14 MED FILL — POTASSIUM CL ER 20 MEQ TABL: 20 | 90 days supply | Qty: 90 | Fill #0

## 2017-03-12 MED FILL — ZOLPIDEM TARTRATE 10 MG TAB: 10 | 90 days supply | Qty: 90 | Fill #0

## 2017-03-19 MED FILL — TAMOXIFEN CITRATE 20 MG TAB: 20 | 90 days supply | Qty: 90 | Fill #1

## 2017-03-27 DIAGNOSIS — C50911 Malignant neoplasm of unspecified site of right female breast: Secondary | ICD-10-CM | POA: Diagnosis not present

## 2017-03-27 DIAGNOSIS — F329 Major depressive disorder, single episode, unspecified: Secondary | ICD-10-CM | POA: Diagnosis not present

## 2017-03-27 DIAGNOSIS — R109 Unspecified abdominal pain: Secondary | ICD-10-CM | POA: Diagnosis not present

## 2017-04-20 MED FILL — LISINOPRIL 40 MG TABLET: 40 | 90 days supply | Qty: 90 | Fill #1

## 2017-04-24 DIAGNOSIS — I10 Essential (primary) hypertension: Secondary | ICD-10-CM | POA: Diagnosis not present

## 2017-04-24 DIAGNOSIS — Z6841 Body Mass Index (BMI) 40.0 and over, adult: Secondary | ICD-10-CM | POA: Diagnosis not present

## 2017-04-24 DIAGNOSIS — F325 Major depressive disorder, single episode, in full remission: Secondary | ICD-10-CM | POA: Diagnosis not present

## 2017-04-24 DIAGNOSIS — D72829 Elevated white blood cell count, unspecified: Secondary | ICD-10-CM | POA: Diagnosis not present

## 2017-04-27 MED FILL — ESCITALOPRAM 10 MG TABLET: 10 | 90 days supply | Qty: 90 | Fill #1

## 2017-05-11 MED FILL — POTASSIUM CL ER 20 MEQ TABL: 20 | 90 days supply | Qty: 90 | Fill #1

## 2017-05-11 MED FILL — HYDROCHLOROTHIAZIDE 25 MG T: 25 | 90 days supply | Qty: 90 | Fill #0

## 2017-05-23 DIAGNOSIS — C50911 Malignant neoplasm of unspecified site of right female breast: Secondary | ICD-10-CM | POA: Diagnosis not present

## 2017-06-07 ENCOUNTER — Other Ambulatory Visit: Payer: Self-pay | Admitting: *Deleted

## 2017-06-07 DIAGNOSIS — Z170 Estrogen receptor positive status [ER+]: Principal | ICD-10-CM

## 2017-06-07 DIAGNOSIS — C50911 Malignant neoplasm of unspecified site of right female breast: Secondary | ICD-10-CM

## 2017-06-08 MED FILL — ZOLPIDEM TARTRATE 10 MG TAB: 10 | 90 days supply | Qty: 90 | Fill #0

## 2017-06-18 MED FILL — TAMOXIFEN CITRATE 20 MG TAB: 20 | 90 days supply | Qty: 90 | Fill #2

## 2017-06-19 ENCOUNTER — Encounter: Payer: Self-pay | Admitting: Genetics

## 2017-06-25 ENCOUNTER — Encounter: Payer: Self-pay | Admitting: Genetics

## 2017-07-11 ENCOUNTER — Ambulatory Visit: Payer: 59 | Attending: Oncology | Admitting: Physical Therapy

## 2017-07-11 DIAGNOSIS — G8929 Other chronic pain: Secondary | ICD-10-CM | POA: Diagnosis not present

## 2017-07-11 DIAGNOSIS — M25511 Pain in right shoulder: Secondary | ICD-10-CM | POA: Diagnosis not present

## 2017-07-11 DIAGNOSIS — I972 Postmastectomy lymphedema syndrome: Secondary | ICD-10-CM | POA: Insufficient documentation

## 2017-07-11 NOTE — Therapy (Signed)
[place], [place], [address] Phone: [phone]   Fax:  [phone]  Physical Therapy Evaluation  Patient Details  Name: [name] Gibson MRN: [id number] Date of Birth: 01-17-1956 Referring Provider: Thedore Mins   Encounter Date: 07/11/2017  PT End of Session - 07/11/17 2106    Visit Number  1    Number of Visits  25    Date for PT Re-Evaluation  10/10/17 agreed with patient not to start PT until she can get regularly on the schedule M, W, F    PT Start Time  1606    PT Stop Time  1647    PT Time Calculation (min)  41 min    Activity Tolerance  Patient tolerated treatment well    Behavior During Therapy  [facility] for tasks assessed/performed       Past Medical History:  Diagnosis Date  . Cellulitis of arm, right 08/21/2013  . Complication of anesthesia    some nausea after second mastectomy surgery  . Hypertension   . Ovarian cyst   . Pituitary microadenoma ([place])   . Psoriasis     Past Surgical History:  Procedure Laterality Date  . Anal Fistula Repair    . BREAST SURGERY     Right Mastectomy  . CERVICAL CONE BIOPSY    . CHOLECYSTECTOMY    . COLPOSCOPY    . TONSILLECTOMY      There were no vitals filed for this visit.   Subjective Assessment - 07/11/17 1609    Subjective  My daughter is getting married in October and I'd like to have this (swollen arm) a little down if I can.  We have a pool at home now and I don't know about wearing a sleeve in the pool.  I can't get the pump on by myself. I can't get the sleeve on by myself. When I wear it it hurts. Wore the custom sleeve religiously for two years but last time I went to [place] at [facility] for a sleeve she gave me one off the shelf. Had some shoulder pain from hanging on the edge of the pool this summer.    Pertinent History  Right breast diagnosed in May 2009. Had mastectomy, chemo, and radiation; is on tamoxifen for 9 years.  HTN  controlled with meds.  Lymphedema started about one year out from treatment; she has been treated here on more than one occasion in the past.    Patient Stated Goals  get reductioin in the arm    Currently in Pain?  Yes    Pain Score  7     Pain Location  Arm    Pain Orientation  Right    Pain Descriptors / Indicators  Heaviness    Aggravating Factors   arm up in air    Pain Relieving Factors  arm at rest         [facility] PT Assessment - 07/11/17 0001      Assessment   Medical Diagnosis  right UE lymphedema    Referring Provider  Thedore Mins      Balance Screen   Has the patient fallen in the past 6 months  No    Has the patient had a decrease in activity level because of a fear of falling?   No    Is the patient reluctant to leave their home because of a fear of falling?   No      Home Environment  Living Environment  Private residence    Living Arrangements  Spouse/significant other      Prior Function   Level of Independence  Independent except with getting sleeve on or pump on    Vocation  Full time employment    Vocation Requirements  [employer] work    Leisure  joined [employer] and has been trying to get there but hasn't been there as much as 3x/week yet      Cognition   Overall Cognitive Status  Within Functional Limits for tasks assessed      Observation/Other Assessments   Other Surveys   -- lymph life impact scale score of 38 = 56% impairment      ROM / Strength   AROM / PROM / Strength  AROM      AROM   Overall AROM Comments  both shoulders WFL        LYMPHEDEMA/ONCOLOGY QUESTIONNAIRE - 07/11/17 1622      What other symptoms do you have   Are you Having Heaviness or Tightness  Yes    Are you having pitting edema  Yes    Body Site  right UE    Stemmer Sign  Yes      Lymphedema Stage   Stage  STAGE 2 SPONTANEOUSLY IRREVERSIBLE      Lymphedema Assessments   Lymphedema Assessments  Upper extremities tissue is soft, although arm is quite large       Right Upper Extremity Lymphedema   15 cm Proximal to Olecranon Process  51.5 cm    10 cm Proximal to Olecranon Process  51.1 cm    Olecranon Process  41.1 cm    15 cm Proximal to Ulnar Styloid Process  45.2 cm    10 cm Proximal to Ulnar Styloid Process  42 cm    Just Proximal to Ulnar Styloid Process  28.5 cm    Across Hand at [employer]  25 cm    At [place] of 2nd Digit  7.7 cm      Left Upper Extremity Lymphedema   15 cm Proximal to Olecranon Process  40.3 cm    10 cm Proximal to Olecranon Process  37.7 cm    Olecranon Process  29.3 cm    15 cm Proximal to Ulnar Styloid Process  29.2 cm    10 cm Proximal to Ulnar Styloid Process  27 cm    Just Proximal to Ulnar Styloid Process  19.3 cm    Across Hand at [employer]  20.8 cm    At [place] of 2nd Digit  6.6 cm          Objective measurements completed on examination: See above findings.                PT Short Term Goals - 07/11/17 2122      PT SHORT TERM GOAL #1   Title  Patient's right arm circumference at 10 cm. proximal to olecranon will reduce to 48 cm.    Baseline  51.1 cm. on eval compared to 37.7 on the left    Time  4    Period  Weeks    Status  New      PT SHORT TERM GOAL #2   Title  Pt.'s right arm circumference at 15 cm. proximal to ulnar styloid will reduceto 42 cm.    Baseline  45.2 cm. right compared to 29.2 on left    Time  4  Period  Weeks    Status  New        PT Long Term Goals - 07/11/17 2125      PT LONG TERM GOAL #1   Title  Patient's right arm circumference at 10 cm. proximal to olecranon will reduce to 45 cm. or less.    Baseline  51.1 cm. compared to 37.7 on left at eval    Time  8    Period  Weeks    Status  New      PT LONG TERM GOAL #2   Title  Patient's right arm circumference at 15 cm. proximal to ulnar styloid will reduce to 39 cm. or less.    Baseline  45.2 cm. on right compared to 29.2 on left at eval.    Time  8    Period  Weeks      PT LONG TERM GOAL  #3   Title  Pt. will have new custom fit compression sleeve and glove.    Time  8    Status  New      PT LONG TERM GOAL #4   Title  Pt. will have a plan in place for lymphedema self-management    Time  8    Period  Weeks    Status  New             Plan - 07/11/17 2107    Clinical Impression Statement  This is a pleasant woman familiar to providers in this clinic from previous episodes of care for right UE lymphedema. She comes in today seeking help with this. Her arm is visibly bigger than it has been in the past.  When she replaced her compression sleeve last time, the vendor issued her an off the shelf type, and she does really need the stiffness of a custom fit flat knit. She is unable to get into her pump garments and turn on the machine without help, so it has become difficult to be regular with that. On the plus side, the tissue in her right UE still feels fairly soft and there remains some pitting, so seems to have little or no fibrosis.     Clinical Decision Making  Low    Rehab Potential  Good    PT Frequency  3x / week    PT Duration  8 weeks    PT Treatment/Interventions  ADLs/Self Care Home Management;DME Instruction;Therapeutic exercise;Patient/family education;Orthotic Fit/Training;Manual techniques;Manual lymph drainage;Compression bandaging;Passive range of motion;Taping    PT Next Visit Plan  Begin complete decongestive therapy; later, suggest custom flatknit compression garments.       Patient will benefit from skilled therapeutic intervention in order to improve the following deficits and impairments:  Increased edema, Decreased knowledge of use of DME  Visit Diagnosis: Postmastectomy lymphedema - Plan: PT plan of care cert/re-cert  Chronic right shoulder pain - Plan: PT plan of care cert/re-cert     Problem List Patient Active Problem List   Diagnosis Date Noted  . Psoriasis 08/23/2013  . Chronic acquired lymphedema 08/21/2013  . Breast cancer, right  breast ([place]) 12/26/2011  . Hypertension   . Carcinoma in situ of cervix   . Ovarian cyst   . Cancer ([place])   . Pituitary microadenoma ([place])     [place] 07/11/2017, 9:35 PM  [place], [place], [address] Phone: [phone]   Fax:  [phone]  Name: [name] Gibson MRN: [id number] Date of Birth: 09/05/1955  Serafina Royals, PT 07/11/17 9:35 PM

## 2017-07-19 MED FILL — LISINOPRIL 40 MG TABLET: 40 | 90 days supply | Qty: 90 | Fill #0

## 2017-07-27 MED FILL — HYDROCHLOROTHIAZIDE 25 MG T: 25 | 90 days supply | Qty: 90 | Fill #1

## 2017-07-30 ENCOUNTER — Ambulatory Visit: Payer: 59 | Attending: Oncology | Admitting: Physical Therapy

## 2017-07-30 ENCOUNTER — Encounter: Payer: Self-pay | Admitting: Physical Therapy

## 2017-07-30 DIAGNOSIS — G8929 Other chronic pain: Secondary | ICD-10-CM | POA: Diagnosis not present

## 2017-07-30 DIAGNOSIS — I972 Postmastectomy lymphedema syndrome: Secondary | ICD-10-CM | POA: Insufficient documentation

## 2017-07-30 DIAGNOSIS — M25511 Pain in right shoulder: Secondary | ICD-10-CM | POA: Diagnosis not present

## 2017-07-30 MED FILL — ESCITALOPRAM 10 MG TABLET: 10 | 90 days supply | Qty: 90 | Fill #0

## 2017-07-30 NOTE — Therapy (Signed)
[place], [place], [address] Phone: [phone]   Fax:  [phone]  Physical Therapy Treatment  Patient Details  Name: [name] Gibson MRN: [id number] Date of Birth: November 10, 1955 Referring Provider: Thedore Mins   Encounter Date: 07/30/2017  PT End of Session - 07/30/17 1701    Visit Number  2    Number of Visits  25    Date for PT Re-Evaluation  10/10/17    PT Start Time  7616    PT Stop Time  1605    PT Time Calculation (min)  49 min    Activity Tolerance  Patient tolerated treatment well    Behavior During Therapy  [facility] for tasks assessed/performed       Past Medical History:  Diagnosis Date  . Cellulitis of arm, right 08/21/2013  . Complication of anesthesia    some nausea after second mastectomy surgery  . Hypertension   . Ovarian cyst   . Pituitary microadenoma ([place])   . Psoriasis     Past Surgical History:  Procedure Laterality Date  . Anal Fistula Repair    . BREAST SURGERY     Right Mastectomy  . CERVICAL CONE BIOPSY    . CHOLECYSTECTOMY    . COLPOSCOPY    . TONSILLECTOMY      There were no vitals filed for this visit.  Subjective Assessment - 07/30/17 1520    Subjective  My arm is huge. It is heavy and is affecting my shoulder.     Pertinent History  Right breast diagnosed in May 2009. Had mastectomy, chemo, and radiation; is on tamoxifen for 9 years.  HTN controlled with meds.  Lymphedema started about one year out from treatment; she has been treated here on more than one occasion in the past.    Patient Stated Goals  get reductioin in the arm    Currently in Pain?  No/denies    Pain Score  0-No pain                      [facility] Adult PT Treatment/Exercise - 07/30/17 0001      Manual Therapy   Manual Therapy  Compression Bandaging;Manual Lymphatic Drainage (MLD)    Manual Lymphatic Drainage (MLD)  short neck, superficial and deep abdominals, left axillary  nodes and establishment of interaxillary pathway, right inguinal nodes and establishment of axillo inguinal pathway, R UE working proximal to distal then retracing all steps    Compression Bandaging  lotion applied then TG soft from hand to axilla, artiflex from hand to axilla, then 1 6 cm bandage, 1 8 cm bandage, 1 10 cm bandage, 3 12 cm bandage with 2nd 12 in herringbone fashion from hand to axilla               PT Short Term Goals - 07/11/17 2122      PT SHORT TERM GOAL #1   Title  Patient's right arm circumference at 10 cm. proximal to olecranon will reduce to 48 cm.    Baseline  51.1 cm. on eval compared to 37.7 on the left    Time  4    Period  Weeks    Status  New      PT SHORT TERM GOAL #2   Title  Pt.'s right arm circumference at 15 cm. proximal to ulnar styloid will reduceto 42 cm.    Baseline  45.2 cm. right compared to 29.2 on left  Time  4    Period  Weeks    Status  New        PT Long Term Goals - 07/11/17 2125      PT LONG TERM GOAL #1   Title  Patient's right arm circumference at 10 cm. proximal to olecranon will reduce to 45 cm. or less.    Baseline  51.1 cm. compared to 37.7 on left at eval    Time  8    Period  Weeks    Status  New      PT LONG TERM GOAL #2   Title  Patient's right arm circumference at 15 cm. proximal to ulnar styloid will reduce to 39 cm. or less.    Baseline  45.2 cm. on right compared to 29.2 on left at eval.    Time  8    Period  Weeks      PT LONG TERM GOAL #3   Title  Pt. will have new custom fit compression sleeve and glove.    Time  8    Status  New      PT LONG TERM GOAL #4   Title  Pt. will have a plan in place for lymphedema self-management    Time  8    Period  Weeks    Status  New            Plan - 07/30/17 1703    Clinical Impression Statement  Began CDT today. Performed MLD to pt's RUE then applied compression bandages from hand to axilla for managment of lymphedema. Did not wrap fingers since this  limits pt's mobility too much.     Rehab Potential  Good    PT Frequency  3x / week    PT Duration  8 weeks    PT Treatment/Interventions  ADLs/Self Care Home Management;DME Instruction;Therapeutic exercise;Patient/family education;Orthotic Fit/Training;Manual techniques;Manual lymph drainage;Compression bandaging;Passive range of motion;Taping    PT Next Visit Plan  continue complete decongestive therapy; later, suggest custom flatknit compression garments.    Consulted and Agree with Plan of Care  Patient       Patient will benefit from skilled therapeutic intervention in order to improve the following deficits and impairments:  Increased edema, Decreased knowledge of use of DME  Visit Diagnosis: Postmastectomy lymphedema     Problem List Patient Active Problem List   Diagnosis Date Noted  . Psoriasis 08/23/2013  . Chronic acquired lymphedema 08/21/2013  . Breast cancer, right breast ([place]) 12/26/2011  . Hypertension   . Carcinoma in situ of cervix   . Ovarian cyst   . Cancer ([place])   . Pituitary microadenoma [facility])     Allyson Sabal [facility] 07/30/2017, 5:04 PM  [place], [place], [address] Phone: [phone]   Fax:  [phone]  Name: [name] Gibson MRN: [id number] Date of Birth: 03-21-1956  Manus Gunning, PT 07/30/17 5:04 PM

## 2017-08-01 ENCOUNTER — Ambulatory Visit: Payer: 59

## 2017-08-01 DIAGNOSIS — I972 Postmastectomy lymphedema syndrome: Secondary | ICD-10-CM

## 2017-08-01 DIAGNOSIS — G8929 Other chronic pain: Secondary | ICD-10-CM | POA: Diagnosis not present

## 2017-08-01 DIAGNOSIS — M25511 Pain in right shoulder: Secondary | ICD-10-CM | POA: Diagnosis not present

## 2017-08-01 NOTE — Therapy (Signed)
[place], [place], [address] Phone: [phone]   Fax:  [phone]  Physical Therapy Treatment  Patient Details  Name: [name] Gibson MRN: [id number] Date of Birth: 1956/02/15 Referring Provider: Thedore Mins   Encounter Date: 08/01/2017  PT End of Session - 08/01/17 1704    Visit Number  3    Number of Visits  25    Date for PT Re-Evaluation  10/10/17    PT Start Time  1608    PT Stop Time  1658    PT Time Calculation (min)  50 min    Activity Tolerance  Patient tolerated treatment well    Behavior During Therapy  [facility] for tasks assessed/performed       Past Medical History:  Diagnosis Date  . Cellulitis of arm, right 08/21/2013  . Complication of anesthesia    some nausea after second mastectomy surgery  . Hypertension   . Ovarian cyst   . Pituitary microadenoma ([place])   . Psoriasis     Past Surgical History:  Procedure Laterality Date  . Anal Fistula Repair    . BREAST SURGERY     Right Mastectomy  . CERVICAL CONE BIOPSY    . CHOLECYSTECTOMY    . COLPOSCOPY    . TONSILLECTOMY      There were no vitals filed for this visit.  Subjective Assessment - 08/01/17 1610    Subjective  Bandage felt a little loose.     Pertinent History  Right breast diagnosed in May 2009. Had mastectomy, chemo, and radiation; is on tamoxifen for 9 years.  HTN controlled with meds.  Lymphedema started about one year out from treatment; she has been treated here on more than one occasion in the past.    Patient Stated Goals  get reductioin in the arm    Currently in Pain?  No/denies                      [facility] Adult PT Treatment/Exercise - 08/01/17 0001      Manual Therapy   Manual Therapy  Compression Bandaging;Manual Lymphatic Drainage (MLD)    Manual Lymphatic Drainage (MLD)  short neck, superficial and deep abdominals, left axillary nodes and establishment of interaxillary pathway, right  inguinal nodes and establishment of axillo inguinal pathway, R UE working proximal to distal then retracing all steps    Compression Bandaging  lotion applied then TG soft from hand to axilla, artiflex x2 from hand to axilla, then 1 6 cm bandage, 1 8 cm bandage, 1 10 cm bandage, 3 12 cm bandage with 2nd 12 in herringbone fashion from hand to axilla               PT Short Term Goals - 07/11/17 2122      PT SHORT TERM GOAL #1   Title  Patient's right arm circumference at 10 cm. proximal to olecranon will reduce to 48 cm.    Baseline  51.1 cm. on eval compared to 37.7 on the left    Time  4    Period  Weeks    Status  New      PT SHORT TERM GOAL #2   Title  Pt.'s right arm circumference at 15 cm. proximal to ulnar styloid will reduceto 42 cm.    Baseline  45.2 cm. right compared to 29.2 on left    Time  4    Period  Weeks  Status  New        PT Long Term Goals - 07/11/17 2125      PT LONG TERM GOAL #1   Title  Patient's right arm circumference at 10 cm. proximal to olecranon will reduce to 45 cm. or less.    Baseline  51.1 cm. compared to 37.7 on left at eval    Time  8    Period  Weeks    Status  New      PT LONG TERM GOAL #2   Title  Patient's right arm circumference at 15 cm. proximal to ulnar styloid will reduce to 39 cm. or less.    Baseline  45.2 cm. on right compared to 29.2 on left at eval.    Time  8    Period  Weeks      PT LONG TERM GOAL #3   Title  Pt. will have new custom fit compression sleeve and glove.    Time  8    Status  New      PT LONG TERM GOAL #4   Title  Pt. will have a plan in place for lymphedema self-management    Time  8    Period  Weeks    Status  New            Plan - 08/01/17 1705    Clinical Impression Statement  Continued with CDT today. Pt came in with bandages intact but reported they felt loose so when reapplied today increased tension. Continued with not wrapping fingers per pts request but instructed her if  swelling at fingers increases we should add these and pt agreed.     Rehab Potential  Good    PT Frequency  3x / week    PT Duration  8 weeks    PT Treatment/Interventions  ADLs/Self Care Home Management;DME Instruction;Therapeutic exercise;Patient/family education;Orthotic Fit/Training;Manual techniques;Manual lymph drainage;Compression bandaging;Passive range of motion;Taping    PT Next Visit Plan  continue complete decongestive therapy; later, suggest custom flatknit compression garments.    Consulted and Agree with Plan of Care  Patient       Patient will benefit from skilled therapeutic intervention in order to improve the following deficits and impairments:  Increased edema, Decreased knowledge of use of DME  Visit Diagnosis: Postmastectomy lymphedema     Problem List Patient Active Problem List   Diagnosis Date Noted  . Psoriasis 08/23/2013  . Chronic acquired lymphedema 08/21/2013  . Breast cancer, right breast ([place]) 12/26/2011  . Hypertension   . Carcinoma in situ of cervix   . Ovarian cyst   . Cancer ([place])   . Pituitary microadenoma [facility])     Otelia Limes, PTA 08/01/2017, 5:07 PM  [place], [place], [address] Phone: [phone]   Fax:  [phone]  Name: ZAIRA IACOVELLI MRN: [id number] Date of Birth: December 26, 1955

## 2017-08-03 ENCOUNTER — Ambulatory Visit: Payer: 59 | Admitting: Physical Therapy

## 2017-08-03 DIAGNOSIS — I972 Postmastectomy lymphedema syndrome: Secondary | ICD-10-CM

## 2017-08-03 DIAGNOSIS — G8929 Other chronic pain: Secondary | ICD-10-CM | POA: Diagnosis not present

## 2017-08-03 DIAGNOSIS — M25511 Pain in right shoulder: Secondary | ICD-10-CM | POA: Diagnosis not present

## 2017-08-03 NOTE — Therapy (Signed)
[place], [place], [address] Phone: [phone]   Fax:  [phone]  Physical Therapy Treatment  Patient Details  Name: [name] Gibson MRN: [id number] Date of Birth: 13-Jun-1956 Referring Provider: Thedore Mins   Encounter Date: 08/03/2017  PT End of Session - 08/03/17 0950    Visit Number  4    Number of Visits  25    Date for PT Re-Evaluation  10/10/17    PT Start Time  0801    PT Stop Time  0846    PT Time Calculation (min)  45 min    Activity Tolerance  Patient tolerated treatment well    Behavior During Therapy  [facility] for tasks assessed/performed       Past Medical History:  Diagnosis Date  . Cellulitis of arm, right 08/21/2013  . Complication of anesthesia    some nausea after second mastectomy surgery  . Hypertension   . Ovarian cyst   . Pituitary microadenoma ([place])   . Psoriasis     Past Surgical History:  Procedure Laterality Date  . Anal Fistula Repair    . BREAST SURGERY     Right Mastectomy  . CERVICAL CONE BIOPSY    . CHOLECYSTECTOMY    . COLPOSCOPY    . TONSILLECTOMY      There were no vitals filed for this visit.  Subjective Assessment - 08/03/17 0947    Subjective  The bandages stayed on really well this time--I think it would've lasted through the weekend.  It was tighter.    Pertinent History  Right breast diagnosed in May 2009. Had mastectomy, chemo, and radiation; is on tamoxifen for 9 years.  HTN controlled with meds.  Lymphedema started about one year out from treatment; she has been treated here on more than one occasion in the past.    Currently in Pain?  No/denies                      [facility] Adult PT Treatment/Exercise - 08/03/17 0001      Manual Therapy   Manual Therapy  Edema management    Edema Management  removed bandages and checked skin, which looks good.  Pt. and therapist both feel the arm looks like it has reduced some already.    Manual Lymphatic Drainage (MLD)  short neck, superficial and deep abdominals, left axillary nodes and establishment of interaxillary pathway, right inguinal nodes and establishment of axillo inguinal pathway, R UE working proximal to distal (to fingers) then retracing all steps    Compression Bandaging  lotion applied then TG soft from hand to axilla, artiflex x2 from hand to axilla, then 1 6 cm bandage, 1 8 cm bandage, 1 10 cm bandage, 3 12 cm bandage with last two 12 cm. bandages in herringbone (one on forearm, one on upper arm), overall from hand to axilla               PT Short Term Goals - 07/11/17 2122      PT SHORT TERM GOAL #1   Title  Patient's right arm circumference at 10 cm. proximal to olecranon will reduce to 48 cm.    Baseline  51.1 cm. on eval compared to 37.7 on the left    Time  4    Period  Weeks    Status  New      PT SHORT TERM GOAL #2   Title  Pt.'s right arm circumference at  15 cm. proximal to ulnar styloid will reduceto 42 cm.    Baseline  45.2 cm. right compared to 29.2 on left    Time  4    Period  Weeks    Status  New        PT Long Term Goals - 07/11/17 2125      PT LONG TERM GOAL #1   Title  Patient's right arm circumference at 10 cm. proximal to olecranon will reduce to 45 cm. or less.    Baseline  51.1 cm. compared to 37.7 on left at eval    Time  8    Period  Weeks    Status  New      PT LONG TERM GOAL #2   Title  Patient's right arm circumference at 15 cm. proximal to ulnar styloid will reduce to 39 cm. or less.    Baseline  45.2 cm. on right compared to 29.2 on left at eval.    Time  8    Period  Weeks      PT LONG TERM GOAL #3   Title  Pt. will have new custom fit compression sleeve and glove.    Time  8    Status  New      PT LONG TERM GOAL #4   Title  Pt. will have a plan in place for lymphedema self-management    Time  8    Period  Weeks    Status  New            Plan - 08/03/17 0950    Clinical Impression  Statement  Pt. is off to a good start, noting reduction in swelling by observation.  Continued complete decongestive therapy today.  Did not wrap fingers again; they did look puffy, but perhaps not any larger than they were before bandaging.    Rehab Potential  Good    PT Frequency  3x / week    PT Duration  8 weeks    PT Treatment/Interventions  ADLs/Self Care Home Management;DME Instruction;Therapeutic exercise;Patient/family education;Orthotic Fit/Training;Manual techniques;Manual lymph drainage;Compression bandaging;Passive range of motion;Taping    PT Next Visit Plan  measure soon--Monday or Wednesday; continue complete decongestive therapy; later, suggest custom flatknit compression garments.    PT Home Exercise Plan  okay to do her workout at [employer] (treadmill, bike, 30-minute circuit room) with bandages on    Consulted and Agree with Plan of Care  Patient       Patient will benefit from skilled therapeutic intervention in order to improve the following deficits and impairments:  Increased edema, Decreased knowledge of use of DME  Visit Diagnosis: Postmastectomy lymphedema     Problem List Patient Active Problem List   Diagnosis Date Noted  . Psoriasis 08/23/2013  . Chronic acquired lymphedema 08/21/2013  . Breast cancer, right breast ([place]) 12/26/2011  . Hypertension   . Carcinoma in situ of cervix   . Ovarian cyst   . Cancer ([place])   . Pituitary microadenoma ([place])     [place] 08/03/2017, 9:53 AM  [place], [place], [address] Phone: [phone]   Fax:  [phone]  Name: [name] Gibson MRN: [id number] Date of Birth: Oct 19, 1955  Serafina Royals, PT 08/03/17 9:53 AM

## 2017-08-06 ENCOUNTER — Ambulatory Visit: Payer: 59 | Admitting: Physical Therapy

## 2017-08-06 DIAGNOSIS — M25511 Pain in right shoulder: Secondary | ICD-10-CM | POA: Diagnosis not present

## 2017-08-06 DIAGNOSIS — I10 Essential (primary) hypertension: Secondary | ICD-10-CM | POA: Diagnosis not present

## 2017-08-06 DIAGNOSIS — G8929 Other chronic pain: Secondary | ICD-10-CM | POA: Diagnosis not present

## 2017-08-06 DIAGNOSIS — I972 Postmastectomy lymphedema syndrome: Secondary | ICD-10-CM | POA: Diagnosis not present

## 2017-08-06 NOTE — Therapy (Signed)
[place], [place], [address] Phone: [phone]   Fax:  [phone]  Physical Therapy Treatment  Patient Details  Name: [name] Gibson MRN: [id number] Date of Birth: [birth year] Referring Provider: Thedore Mins   Encounter Date: 08/06/2017  PT End of Session - 08/06/17 1700    Visit Number  5    Number of Visits  25    Date for PT Re-Evaluation  10/10/17    PT Start Time  6283    PT Stop Time  1603    PT Time Calculation (min)  46 min    Activity Tolerance  Patient tolerated treatment well    Behavior During Therapy  [facility] for tasks assessed/performed       Past Medical History:  Diagnosis Date  . Cellulitis of arm, right 08/21/2013  . Complication of anesthesia    some nausea after second mastectomy surgery  . Hypertension   . Ovarian cyst   . Pituitary microadenoma ([place])   . Psoriasis     Past Surgical History:  Procedure Laterality Date  . Anal Fistula Repair    . BREAST SURGERY     Right Mastectomy  . CERVICAL CONE BIOPSY    . CHOLECYSTECTOMY    . COLPOSCOPY    . TONSILLECTOMY      There were no vitals filed for this visit.  Subjective Assessment - 08/06/17 1517    Subjective  Did okay.  It still feels tight in here (forearm).    Pertinent History  Right breast diagnosed in May 2009. Had mastectomy, chemo, and radiation; is on tamoxifen for 9 years.  HTN controlled with meds.  Lymphedema started about one year out from treatment; she has been treated here on more than one occasion in the past.    Currently in Pain?  No/denies            LYMPHEDEMA/ONCOLOGY QUESTIONNAIRE - 08/06/17 1523      Right Upper Extremity Lymphedema   10 cm Proximal to Olecranon Process  45 cm    Olecranon Process  38.8 cm    15 cm Proximal to Ulnar Styloid Process  38.7 cm    10 cm Proximal to Ulnar Styloid Process  35.3 cm    Just Proximal to Ulnar Styloid Process  27.2 cm    Across Hand at  [employer]  23.3 cm    At [place] of 2nd Digit  7.8 cm               [facility] Adult PT Treatment/Exercise - 08/06/17 0001      Manual Therapy   Edema Management  removed bandages and checked skin, which looks good.  Circumference measurements taken.    Manual Lymphatic Drainage (MLD)  short neck, superficial and deep abdominals, left axillary nodes and establishment of interaxillary pathway, right inguinal nodes and establishment of axillo inguinal pathway, R UE working proximal to distal (to fingers) then retracing all steps    Compression Bandaging  lotion applied then TG soft from hand to axilla, artiflex x2 from hand to axilla, then 1 6 cm bandage, 1 8 cm bandage, 1 10 cm bandage, 3 12 cm bandage with two of the last three 12 cm. bandages in herringbone (one on forearm, one on upper arm), overall from hand to axilla               PT Short Term Goals - 07/11/17 2122      PT SHORT  TERM GOAL #1   Title  Patient's right arm circumference at 10 cm. proximal to olecranon will reduce to 48 cm.    Baseline  51.1 cm. on eval compared to 37.7 on the left    Time  4    Period  Weeks    Status  New      PT SHORT TERM GOAL #2   Title  Pt.'s right arm circumference at 15 cm. proximal to ulnar styloid will reduceto 42 cm.    Baseline  45.2 cm. right compared to 29.2 on left    Time  4    Period  Weeks    Status  New        PT Long Term Goals - 07/11/17 2125      PT LONG TERM GOAL #1   Title  Patient's right arm circumference at 10 cm. proximal to olecranon will reduce to 45 cm. or less.    Baseline  51.1 cm. compared to 37.7 on left at eval    Time  8    Period  Weeks    Status  New      PT LONG TERM GOAL #2   Title  Patient's right arm circumference at 15 cm. proximal to ulnar styloid will reduce to 39 cm. or less.    Baseline  45.2 cm. on right compared to 29.2 on left at eval.    Time  8    Period  Weeks      PT LONG TERM GOAL #3   Title  Pt. will have new  custom fit compression sleeve and glove.    Time  8    Status  New      PT LONG TERM GOAL #4   Title  Pt. will have a plan in place for lymphedema self-management    Time  8    Period  Weeks    Status  New            Plan - 08/06/17 1701    Clinical Impression Statement  Pt.'s arm circumferences were measured today and she shows very significant reductions except at her finger; arm reductions are on the order of 6 cm.  Tissue also feels soft today.  She is tolerating bandaging well and she requests that wrapping be done tightly.    Rehab Potential  Good    PT Frequency  3x / week    PT Duration  8 weeks    PT Treatment/Interventions  ADLs/Self Care Home Management;DME Instruction;Therapeutic exercise;Patient/family education;Orthotic Fit/Training;Manual techniques;Manual lymph drainage;Compression bandaging;Passive range of motion;Taping    PT Next Visit Plan  continue complete decongestive therapy; later, suggest custom flatknit compression garments.    PT Home Exercise Plan  okay to do her workout at [employer] (treadmill, bike, 30-minute circuit room) with bandages on    Consulted and Agree with Plan of Care  Patient       Patient will benefit from skilled therapeutic intervention in order to improve the following deficits and impairments:  Increased edema, Decreased knowledge of use of DME  Visit Diagnosis: Postmastectomy lymphedema     Problem List Patient Active Problem List   Diagnosis Date Noted  . Psoriasis 08/23/2013  . Chronic acquired lymphedema 08/21/2013  . Breast cancer, right breast ([place]) 12/26/2011  . Hypertension   . Carcinoma in situ of cervix   . Ovarian cyst   . Cancer ([place])   . Pituitary microadenoma ([place])     [place] 08/06/2017, 5:03  PM  [place], [place], [address] Phone: [phone]   Fax:  [phone]  Name: [name] Gibson MRN:  [id number] Date of Birth: [birth year]  Serafina Royals, PT 08/06/17 5:03 PM

## 2017-08-08 ENCOUNTER — Ambulatory Visit: Payer: 59

## 2017-08-08 DIAGNOSIS — I972 Postmastectomy lymphedema syndrome: Secondary | ICD-10-CM | POA: Diagnosis not present

## 2017-08-08 DIAGNOSIS — G8929 Other chronic pain: Secondary | ICD-10-CM | POA: Diagnosis not present

## 2017-08-08 DIAGNOSIS — M25511 Pain in right shoulder: Secondary | ICD-10-CM | POA: Diagnosis not present

## 2017-08-08 NOTE — Therapy (Signed)
[place], [place], [address] Phone: [phone]   Fax:  [phone]  Physical Therapy Treatment  Patient Details  Name: [name] Gibson MRN: [id number] Date of Birth: 1955-10-17 Referring Provider: Thedore Mins   Encounter Date: 08/08/2017  PT End of Session - 08/08/17 1701    Visit Number  6    Number of Visits  25    Date for PT Re-Evaluation  10/10/17    PT Start Time  6962    PT Stop Time  1656    PT Time Calculation (min)  49 min    Activity Tolerance  Patient tolerated treatment well    Behavior During Therapy  [facility] for tasks assessed/performed       Past Medical History:  Diagnosis Date  . Cellulitis of arm, right 08/21/2013  . Complication of anesthesia    some nausea after second mastectomy surgery  . Hypertension   . Ovarian cyst   . Pituitary microadenoma ([place])   . Psoriasis     Past Surgical History:  Procedure Laterality Date  . Anal Fistula Repair    . BREAST SURGERY     Right Mastectomy  . CERVICAL CONE BIOPSY    . CHOLECYSTECTOMY    . COLPOSCOPY    . TONSILLECTOMY      There were no vitals filed for this visit.  Subjective Assessment - 08/08/17 1616    Subjective  My fingers tried to start swelling but Ithink the wrist was a little tight.     Pertinent History  Right breast diagnosed in May 2009. Had mastectomy, chemo, and radiation; is on tamoxifen for 9 years.  HTN controlled with meds.  Lymphedema started about one year out from treatment; she has been treated here on more than one occasion in the past.    Patient Stated Goals  get reductioin in the arm    Currently in Pain?  No/denies                      [facility] Adult PT Treatment/Exercise - 08/08/17 0001      Manual Therapy   Manual Therapy  Manual Lymphatic Drainage (MLD);Compression Bandaging    Manual Lymphatic Drainage (MLD)  short neck, superficial and deep abdominals, left axillary nodes  and establishment of interaxillary pathway, right inguinal nodes and establishment of axillo inguinal pathway, R UE working proximal to distal (to fingers) then retracing all steps    Compression Bandaging  lotion applied then TG soft from hand to axilla, Elastomull to fingers 1-4 only to 2nd PIP, artiflex x2 from hand to axilla, then 1 6 cm bandage, 1 8 cm bandage, 1 10 cm bandage, 3 12 cm bandage with two of the last three 12 cm. bandages in herringbone (one on forearm, one on upper arm), overall from hand to axilla               PT Short Term Goals - 07/11/17 2122      PT SHORT TERM GOAL #1   Title  Patient's right arm circumference at 10 cm. proximal to olecranon will reduce to 48 cm.    Baseline  51.1 cm. on eval compared to 37.7 on the left    Time  4    Period  Weeks    Status  New      PT SHORT TERM GOAL #2   Title  Pt.'s right arm circumference at 15 cm. proximal to ulnar styloid  will reduceto 42 cm.    Baseline  45.2 cm. right compared to 29.2 on left    Time  4    Period  Weeks    Status  New        PT Long Term Goals - 07/11/17 2125      PT LONG TERM GOAL #1   Title  Patient's right arm circumference at 10 cm. proximal to olecranon will reduce to 45 cm. or less.    Baseline  51.1 cm. compared to 37.7 on left at eval    Time  8    Period  Weeks    Status  New      PT LONG TERM GOAL #2   Title  Patient's right arm circumference at 15 cm. proximal to ulnar styloid will reduce to 39 cm. or less.    Baseline  45.2 cm. on right compared to 29.2 on left at eval.    Time  8    Period  Weeks      PT LONG TERM GOAL #3   Title  Pt. will have new custom fit compression sleeve and glove.    Time  8    Status  New      PT LONG TERM GOAL #4   Title  Pt. will have a plan in place for lymphedema self-management    Time  8    Period  Weeks    Status  New            Plan - 08/08/17 1701    Clinical Impression Statement  Pt is tolerating bandaging very well  and being compliant with leaving bandages on between sessions only to remove them here. Pt had noticed her finger swelling had increased since last session so agreed to trial bandaging with elastomull to fingers 1-4 to see if she could tolerate having her fingers wrapped.     Rehab Potential  Good    PT Frequency  3x / week    PT Duration  8 weeks    PT Treatment/Interventions  ADLs/Self Care Home Management;DME Instruction;Therapeutic exercise;Patient/family education;Orthotic Fit/Training;Manual techniques;Manual lymph drainage;Compression bandaging;Passive range of motion;Taping    PT Next Visit Plan  See how pt tolerated elastomull and cont wrapping fingers as well if pt agreeable; continue complete decongestive therapy; later, suggest custom flatknit compression garments.    Consulted and Agree with Plan of Care  Patient       Patient will benefit from skilled therapeutic intervention in order to improve the following deficits and impairments:  Increased edema, Decreased knowledge of use of DME  Visit Diagnosis: Postmastectomy lymphedema     Problem List Patient Active Problem List   Diagnosis Date Noted  . Psoriasis 08/23/2013  . Chronic acquired lymphedema 08/21/2013  . Breast cancer, right breast ([place]) 12/26/2011  . Hypertension   . Carcinoma in situ of cervix   . Ovarian cyst   . Cancer ([place])   . Pituitary microadenoma [facility])     Otelia Limes, PTA 08/08/2017, 5:05 PM  [place], [place], [address] Phone: [phone]   Fax:  [phone]  Name: [name] Gibson MRN: [id number] Date of Birth: 06-09-1956

## 2017-08-10 ENCOUNTER — Ambulatory Visit: Payer: 59 | Admitting: Physical Therapy

## 2017-08-10 DIAGNOSIS — G8929 Other chronic pain: Secondary | ICD-10-CM | POA: Diagnosis not present

## 2017-08-10 DIAGNOSIS — M25511 Pain in right shoulder: Secondary | ICD-10-CM

## 2017-08-10 DIAGNOSIS — I972 Postmastectomy lymphedema syndrome: Secondary | ICD-10-CM

## 2017-08-10 NOTE — Therapy (Signed)
[place], [place], [address] Phone: [phone]   Fax:  [phone]  Physical Therapy Treatment  Patient Details  Name: [name] Gibson MRN: [id number] Date of Birth: 1956/04/22 Referring Provider: Thedore Mins   Encounter Date: 08/10/2017  PT End of Session - 08/10/17 0948    Visit Number  7    Number of Visits  25    Date for PT Re-Evaluation  10/10/17    PT Start Time  0802    PT Stop Time  0849    PT Time Calculation (min)  47 min    Activity Tolerance  Patient tolerated treatment well    Behavior During Therapy  [facility] for tasks assessed/performed       Past Medical History:  Diagnosis Date  . Cellulitis of arm, right 08/21/2013  . Complication of anesthesia    some nausea after second mastectomy surgery  . Hypertension   . Ovarian cyst   . Pituitary microadenoma ([place])   . Psoriasis     Past Surgical History:  Procedure Laterality Date  . Anal Fistula Repair    . BREAST SURGERY     Right Mastectomy  . CERVICAL CONE BIOPSY    . CHOLECYSTECTOMY    . COLPOSCOPY    . TONSILLECTOMY      There were no vitals filed for this visit.  Subjective Assessment - 08/10/17 0804    Subjective  I had to take some Advil the first night to sleep. It was tight right in the elbow.  I was able to sleep     Currently in Pain?  No/denies                      [facility] Adult PT Treatment/Exercise - 08/10/17 0001      Manual Therapy   Edema Management  removed bandages and checked skin, which looks good.  Circumference measurements taken.    Manual Lymphatic Drainage (MLD)  short neck, superficial and deep abdominals, left axillary nodes and establishment of interaxillary pathway, right inguinal nodes and establishment of axillo inguinal pathway, R UE working proximal to distal (to fingers) then retracing all steps    Compression Bandaging  lotion applied then TG soft from hand to axilla,  Elastomull to fingers 1-4 only to PIP, artiflex x2 from hand to axilla, then 1 6 cm bandage, 1 8 cm bandage, 1 10 cm bandage, 3 12 cm bandage with two of the last three 12 cm. bandages in herringbone (one on forearm, one on upper arm), overall from hand to axilla               PT Short Term Goals - 08/10/17 0951      PT SHORT TERM GOAL #1   Title  Patient's right arm circumference at 10 cm. proximal to olecranon will reduce to 48 cm.    Baseline  51.1 cm. on eval compared to 37.7 on the left    Status  Achieved      PT SHORT TERM GOAL #2   Title  Pt.'s right arm circumference at 15 cm. proximal to ulnar styloid will reduceto 42 cm.    Baseline  45.2 cm. right compared to 29.2 on left    Status  Achieved        PT Long Term Goals - 07/11/17 2125      PT LONG TERM GOAL #1   Title  Patient's right arm circumference at 10 cm.  proximal to olecranon will reduce to 45 cm. or less.    Baseline  51.1 cm. compared to 37.7 on left at eval    Time  8    Period  Weeks    Status  New      PT LONG TERM GOAL #2   Title  Patient's right arm circumference at 15 cm. proximal to ulnar styloid will reduce to 39 cm. or less.    Baseline  45.2 cm. on right compared to 29.2 on left at eval.    Time  8    Period  Weeks      PT LONG TERM GOAL #3   Title  Pt. will have new custom fit compression sleeve and glove.    Time  8    Status  New      PT LONG TERM GOAL #4   Title  Pt. will have a plan in place for lymphedema self-management    Time  8    Period  Weeks    Status  New            Plan - 08/10/17 5427    Clinical Impression Statement  Pt. continues to do well with this course of bandaging and is pleased with reductions.    Rehab Potential  Good    PT Frequency  3x / week    PT Duration  8 weeks    PT Treatment/Interventions  ADLs/Self Care Home Management;DME Instruction;Therapeutic exercise;Patient/family education;Orthotic Fit/Training;Manual techniques;Manual lymph  drainage;Compression bandaging;Passive range of motion;Taping    PT Next Visit Plan  See how pt tolerated elastomull and cont wrapping fingers as well if pt agreeable; continue complete decongestive therapy; later, suggest custom flatknit compression garments.    PT Home Exercise Plan  okay to do her workout at [employer] (treadmill, bike, 30-minute circuit room) with bandages on    Consulted and Agree with Plan of Care  Patient       Patient will benefit from skilled therapeutic intervention in order to improve the following deficits and impairments:  Increased edema, Decreased knowledge of use of DME  Visit Diagnosis: Postmastectomy lymphedema  Chronic right shoulder pain     Problem List Patient Active Problem List   Diagnosis Date Noted  . Psoriasis 08/23/2013  . Chronic acquired lymphedema 08/21/2013  . Breast cancer, right breast ([place]) 12/26/2011  . Hypertension   . Carcinoma in situ of cervix   . Ovarian cyst   . Cancer ([place])   . Pituitary microadenoma ([place])     [place] 08/10/2017, 9:54 AM  [place], [place], [address] Phone: [phone]   Fax:  [phone]  Name: [name] Gibson MRN: [id number] Date of Birth: [birth year]  Serafina Royals, PT 08/10/17 9:54 AM

## 2017-08-13 ENCOUNTER — Ambulatory Visit: Payer: 59 | Admitting: Physical Therapy

## 2017-08-13 DIAGNOSIS — I972 Postmastectomy lymphedema syndrome: Secondary | ICD-10-CM

## 2017-08-13 DIAGNOSIS — G8929 Other chronic pain: Secondary | ICD-10-CM | POA: Diagnosis not present

## 2017-08-13 DIAGNOSIS — M25511 Pain in right shoulder: Secondary | ICD-10-CM | POA: Diagnosis not present

## 2017-08-13 NOTE — Therapy (Signed)
[place], [place], [address] Phone: [phone]   Fax:  [phone]  Physical Therapy Treatment  Patient Details  Name: [name] Gibson MRN: [id number] Date of Birth: 09/09/1955 Referring Provider: Thedore Mins   Encounter Date: 08/13/2017  PT End of Session - 08/13/17 1714    Visit Number  8    Number of Visits  25    Date for PT Re-Evaluation  10/10/17    PT Start Time  1512    PT Stop Time  1608    PT Time Calculation (min)  56 min    Activity Tolerance  Patient tolerated treatment well    Behavior During Therapy  [facility] for tasks assessed/performed       Past Medical History:  Diagnosis Date  . Cellulitis of arm, right 08/21/2013  . Complication of anesthesia    some nausea after second mastectomy surgery  . Hypertension   . Ovarian cyst   . Pituitary microadenoma ([place])   . Psoriasis     Past Surgical History:  Procedure Laterality Date  . Anal Fistula Repair    . BREAST SURGERY     Right Mastectomy  . CERVICAL CONE BIOPSY    . CHOLECYSTECTOMY    . COLPOSCOPY    . TONSILLECTOMY      There were no vitals filed for this visit.  Subjective Assessment - 08/13/17 1522    Subjective  "It did okay--the hand started to come off so I had to just pull it back on like this."    Pertinent History  Right breast diagnosed in May 2009. Had mastectomy, chemo, and radiation; is on tamoxifen for 9 years.  HTN controlled with meds.  Lymphedema started about one year out from treatment; she has been treated here on more than one occasion in the past.    Currently in Pain?  No/denies            LYMPHEDEMA/ONCOLOGY QUESTIONNAIRE - 08/13/17 1523      Right Upper Extremity Lymphedema   15 cm Proximal to Olecranon Process  43 cm    10 cm Proximal to Olecranon Process  42.3 cm    Olecranon Process  38 cm    15 cm Proximal to Ulnar Styloid Process  36.9 cm    10 cm Proximal to Ulnar Styloid  Process  33.4 cm    Just Proximal to Ulnar Styloid Process  26.5 cm    Across Hand at [employer]  23.3 cm    At [place] of 2nd Digit  7.7 cm               [facility] Adult PT Treatment/Exercise - 08/13/17 0001      Manual Therapy   Edema Management  removed bandages and checked skin, which looks good.  Circumference measurements taken.    Manual Lymphatic Drainage (MLD)  short neck, superficial and deep abdominals, left axillary nodes and establishment of interaxillary pathway, right inguinal nodes and establishment of axillo inguinal pathway, R UE working proximal to distal (to fingers) then retracing all steps    Compression Bandaging  lotion applied then TG soft from hand to axilla, Elastomull to all fingers only to PIP, artiflex x2 from hand to axilla, then 1-6 cm bandage, 2-10 cm bandage, 3-12 cm bandage, with two of the last three 12 cm. bandages in herringbone (one on forearm, one on upper arm), overall from hand to axilla  PT Short Term Goals - 08/10/17 0951      PT SHORT TERM GOAL #1   Title  Patient's right arm circumference at 10 cm. proximal to olecranon will reduce to 48 cm.    Baseline  51.1 cm. on eval compared to 37.7 on the left    Status  Achieved      PT SHORT TERM GOAL #2   Title  Pt.'s right arm circumference at 15 cm. proximal to ulnar styloid will reduceto 42 cm.    Baseline  45.2 cm. right compared to 29.2 on left    Status  Achieved        PT Long Term Goals - 07/11/17 2125      PT LONG TERM GOAL #1   Title  Patient's right arm circumference at 10 cm. proximal to olecranon will reduce to 45 cm. or less.    Baseline  51.1 cm. compared to 37.7 on left at eval    Time  8    Period  Weeks    Status  New      PT LONG TERM GOAL #2   Title  Patient's right arm circumference at 15 cm. proximal to ulnar styloid will reduce to 39 cm. or less.    Baseline  45.2 cm. on right compared to 29.2 on left at eval.    Time  8    Period  Weeks       PT LONG TERM GOAL #3   Title  Pt. will have new custom fit compression sleeve and glove.    Time  8    Status  New      PT LONG TERM GOAL #4   Title  Pt. will have a plan in place for lymphedema self-management    Time  8    Period  Weeks    Status  New            Plan - 08/13/17 1714    Clinical Impression Statement  Circumference measurements show continued reductions at forearm and upper arm, though not at hand.  We compared these measurements to those taken when she finished up a course of treatment about three years ago, and current measurements remain greater than those.  She is making quick progress, and tissue has remained soft since starting bandaging.    Rehab Potential  Good    PT Frequency  3x / week    PT Duration  8 weeks    PT Treatment/Interventions  ADLs/Self Care Home Management;DME Instruction;Therapeutic exercise;Patient/family education;Orthotic Fit/Training;Manual techniques;Manual lymph drainage;Compression bandaging;Passive range of motion;Taping    PT Next Visit Plan  continue complete decongestive therapy; later, suggest custom flatknit compression garments.    PT Home Exercise Plan  okay to do her workout at [employer] (treadmill, bike, 30-minute circuit room) with bandages on    Consulted and Agree with Plan of Care  Patient       Patient will benefit from skilled therapeutic intervention in order to improve the following deficits and impairments:     Visit Diagnosis: Postmastectomy lymphedema     Problem List Patient Active Problem List   Diagnosis Date Noted  . Psoriasis 08/23/2013  . Chronic acquired lymphedema 08/21/2013  . Breast cancer, right breast ([place]) 12/26/2011  . Hypertension   . Carcinoma in situ of cervix   . Ovarian cyst   . Cancer ([place])   . Pituitary microadenoma ([place])     [place] 08/13/2017, 5:17 PM  [facility]  [place], [place],  [address] Phone: [phone]   Fax:  [phone]  Name: [name] Gibson MRN: [id number] Date of Birth: August 25, 1955  Serafina Royals, PT 08/13/17 5:17 PM

## 2017-08-15 ENCOUNTER — Ambulatory Visit: Payer: 59

## 2017-08-15 DIAGNOSIS — I972 Postmastectomy lymphedema syndrome: Secondary | ICD-10-CM | POA: Diagnosis not present

## 2017-08-15 DIAGNOSIS — M25511 Pain in right shoulder: Secondary | ICD-10-CM | POA: Diagnosis not present

## 2017-08-15 DIAGNOSIS — G8929 Other chronic pain: Secondary | ICD-10-CM | POA: Diagnosis not present

## 2017-08-15 MED FILL — POTASSIUM CL ER 20 MEQ TABL: 20 | 90 days supply | Qty: 90 | Fill #0

## 2017-08-15 NOTE — Therapy (Signed)
[place], [place], [address] Phone: [phone]   Fax:  [phone]  Physical Therapy Treatment  Patient Details  Name: [name] Gibson MRN: [id number] Date of Birth: 02-07-1956 Referring Provider: Thedore Mins   Encounter Date: 08/15/2017  PT End of Session - 08/15/17 1658    Visit Number  9    Number of Visits  25    Date for PT Re-Evaluation  10/10/17    PT Start Time  5638    PT Stop Time  1653    PT Time Calculation (min)  48 min    Activity Tolerance  Patient tolerated treatment well    Behavior During Therapy  [facility] for tasks assessed/performed       Past Medical History:  Diagnosis Date  . Cellulitis of arm, right 08/21/2013  . Complication of anesthesia    some nausea after second mastectomy surgery  . Hypertension   . Ovarian cyst   . Pituitary microadenoma ([place])   . Psoriasis     Past Surgical History:  Procedure Laterality Date  . Anal Fistula Repair    . BREAST SURGERY     Right Mastectomy  . CERVICAL CONE BIOPSY    . CHOLECYSTECTOMY    . COLPOSCOPY    . TONSILLECTOMY      There were no vitals filed for this visit.  Subjective Assessment - 08/15/17 1613    Subjective  The fingers started too unravel a little but overall the bandage stayed good. It did feel a little loose at the back of my elbow though.     Pertinent History  Right breast diagnosed in May 2009. Had mastectomy, chemo, and radiation; is on tamoxifen for 9 years.  HTN controlled with meds.  Lymphedema started about one year out from treatment; she has been treated here on more than one occasion in the past.    Patient Stated Goals  get reductioin in the arm    Currently in Pain?  No/denies                      [facility] Adult PT Treatment/Exercise - 08/15/17 0001      Manual Therapy   Edema Management  removed bandages and checked skin, which looks good.     Manual Lymphatic Drainage (MLD)   short neck, superficial and deep abdominals, left axillary nodes and establishment of interaxillary pathway, right inguinal nodes and establishment of axillo inguinal pathway, R UE working proximal to distal (to fingers) then retracing all steps    Compression Bandaging  lotion applied then TG soft from hand to axilla, Elastomull to fingers 1-4 only to PIP, artiflex x2 from hand to axilla, then 1-6 cm bandage, 2-10 cm bandage, 3-12 cm bandage, with two of the last three 12 cm. bandages in herringbone (one on forearm, one on upper arm), overall from hand to axilla               PT Short Term Goals - 08/10/17 0951      PT SHORT TERM GOAL #1   Title  Patient's right arm circumference at 10 cm. proximal to olecranon will reduce to 48 cm.    Baseline  51.1 cm. on eval compared to 37.7 on the left    Status  Achieved      PT SHORT TERM GOAL #2   Title  Pt.'s right arm circumference at 15 cm. proximal to ulnar styloid will reduceto 42 cm.  Baseline  45.2 cm. right compared to 29.2 on left    Status  Achieved        PT Long Term Goals - 07/11/17 2125      PT LONG TERM GOAL #1   Title  Patient's right arm circumference at 10 cm. proximal to olecranon will reduce to 45 cm. or less.    Baseline  51.1 cm. compared to 37.7 on left at eval    Time  8    Period  Weeks    Status  New      PT LONG TERM GOAL #2   Title  Patient's right arm circumference at 15 cm. proximal to ulnar styloid will reduce to 39 cm. or less.    Baseline  45.2 cm. on right compared to 29.2 on left at eval.    Time  8    Period  Weeks      PT LONG TERM GOAL #3   Title  Pt. will have new custom fit compression sleeve and glove.    Time  8    Status  New      PT LONG TERM GOAL #4   Title  Pt. will have a plan in place for lymphedema self-management    Time  8    Period  Weeks    Status  New            Plan - 08/15/17 1658    Clinical Impression Statement  Pt is tolerating bandaging very well and  visibly she appears to reduce each session with her tissue being palpably softer. She asked if there was anthing else she could do to hasten reductions so instructed her briefly at end of session how to perform self MLD up to an dincluding anastomosis at least once/day. She verbalized understanding this. Pt is pleased she is reducing so quickly this episode.     Rehab Potential  Good    PT Frequency  3x / week    PT Duration  8 weeks    PT Treatment/Interventions  ADLs/Self Care Home Management;DME Instruction;Therapeutic exercise;Patient/family education;Orthotic Fit/Training;Manual techniques;Manual lymph drainage;Compression bandaging;Passive range of motion;Taping    PT Next Visit Plan  continue complete decongestive therapy; later, suggest custom flatknit compression garments.    Consulted and Agree with Plan of Care  Patient       Patient will benefit from skilled therapeutic intervention in order to improve the following deficits and impairments:  Increased edema, Decreased knowledge of use of DME  Visit Diagnosis: Postmastectomy lymphedema     Problem List Patient Active Problem List   Diagnosis Date Noted  . Psoriasis 08/23/2013  . Chronic acquired lymphedema 08/21/2013  . Breast cancer, right breast ([place]) 12/26/2011  . Hypertension   . Carcinoma in situ of cervix   . Ovarian cyst   . Cancer ([place])   . Pituitary microadenoma [facility])     Otelia Limes, PTA 08/15/2017, 5:01 PM  [place], [place], [address] Phone: [phone]   Fax:  [phone]  Name: [name] Gibson MRN: [id number] Date of Birth: 1956-01-27

## 2017-08-17 ENCOUNTER — Ambulatory Visit: Payer: 59 | Admitting: Physical Therapy

## 2017-08-17 DIAGNOSIS — I972 Postmastectomy lymphedema syndrome: Secondary | ICD-10-CM | POA: Diagnosis not present

## 2017-08-17 DIAGNOSIS — G8929 Other chronic pain: Secondary | ICD-10-CM | POA: Diagnosis not present

## 2017-08-17 DIAGNOSIS — M25511 Pain in right shoulder: Secondary | ICD-10-CM | POA: Diagnosis not present

## 2017-08-17 NOTE — Therapy (Signed)
[place], [place], [address] Phone: [phone]   Fax:  [phone]  Physical Therapy Treatment  Patient Details  Name: [name] Gibson MRN: [id number] Date of Birth: 1955/10/13 Referring Provider: Thedore Mins   Encounter Date: 08/17/2017  PT End of Session - 08/17/17 0859    Visit Number  10    Number of Visits  25    Date for PT Re-Evaluation  10/10/17    PT Start Time  0802    PT Stop Time  0851    PT Time Calculation (min)  49 min    Activity Tolerance  Patient tolerated treatment well    Behavior During Therapy  [facility] for tasks assessed/performed       Past Medical History:  Diagnosis Date  . Cellulitis of arm, right 08/21/2013  . Complication of anesthesia    some nausea after second mastectomy surgery  . Hypertension   . Ovarian cyst   . Pituitary microadenoma ([place])   . Psoriasis     Past Surgical History:  Procedure Laterality Date  . Anal Fistula Repair    . BREAST SURGERY     Right Mastectomy  . CERVICAL CONE BIOPSY    . CHOLECYSTECTOMY    . COLPOSCOPY    . TONSILLECTOMY      There were no vitals filed for this visit.  Subjective Assessment - 08/17/17 0803    Subjective  Nothing new.  Had some minor irritation at base of right thumb, so pulled on stockinette there.    Pertinent History  Right breast diagnosed in May 2009. Had mastectomy, chemo, and radiation; is on tamoxifen for 9 years.  HTN controlled with meds.  Lymphedema started about one year out from treatment; she has been treated here on more than one occasion in the past.    Currently in Pain?  No/denies                      [facility] Adult PT Treatment/Exercise - 08/17/17 0001      Manual Therapy   Edema Management  removed bandages and checked skin, which looks good.     Manual Lymphatic Drainage (MLD)  short neck, superficial and deep abdominals, left axillary nodes and establishment of  interaxillary pathway, right inguinal nodes and establishment of axillo inguinal pathway, R UE working proximal to distal (to fingers) then retracing all steps    Compression Bandaging  lotion applied then TG soft from hand to axilla, Elastomull to all fingers only to PIP, artiflex x2 from hand to axilla, then 1-6 cm bandage, 2-10 cm bandage, 3-12 cm bandage, with two of the last three 12 cm. bandages in herringbone (one on forearm, one on upper arm), overall from hand to axilla               PT Short Term Goals - 08/10/17 0951      PT SHORT TERM GOAL #1   Title  Patient's right arm circumference at 10 cm. proximal to olecranon will reduce to 48 cm.    Baseline  51.1 cm. on eval compared to 37.7 on the left    Status  Achieved      PT SHORT TERM GOAL #2   Title  Pt.'s right arm circumference at 15 cm. proximal to ulnar styloid will reduceto 42 cm.    Baseline  45.2 cm. right compared to 29.2 on left    Status  Achieved  PT Long Term Goals - 07/11/17 2125      PT LONG TERM GOAL #1   Title  Patient's right arm circumference at 10 cm. proximal to olecranon will reduce to 45 cm. or less.    Baseline  51.1 cm. compared to 37.7 on left at eval    Time  8    Period  Weeks    Status  New      PT LONG TERM GOAL #2   Title  Patient's right arm circumference at 15 cm. proximal to ulnar styloid will reduce to 39 cm. or less.    Baseline  45.2 cm. on right compared to 29.2 on left at eval.    Time  8    Period  Weeks      PT LONG TERM GOAL #3   Title  Pt. will have new custom fit compression sleeve and glove.    Time  8    Status  New      PT LONG TERM GOAL #4   Title  Pt. will have a plan in place for lymphedema self-management    Time  8    Period  Weeks    Status  New            Plan - 08/17/17 0859    Clinical Impression Statement  Continuing to do well with complete decongestive therapy.    Rehab Potential  Good    PT Frequency  3x / week    PT Duration   8 weeks    PT Treatment/Interventions  ADLs/Self Care Home Management;DME Instruction;Therapeutic exercise;Patient/family education;Orthotic Fit/Training;Manual techniques;Manual lymph drainage;Compression bandaging;Passive range of motion;Taping    PT Next Visit Plan  continue complete decongestive therapy; later, suggest custom flatknit compression garments.    PT Home Exercise Plan  okay to do her workout at [employer] (treadmill, bike, 30-minute circuit room) with bandages on    Consulted and Agree with Plan of Care  Patient       Patient will benefit from skilled therapeutic intervention in order to improve the following deficits and impairments:  Increased edema, Decreased knowledge of use of DME  Visit Diagnosis: Postmastectomy lymphedema     Problem List Patient Active Problem List   Diagnosis Date Noted  . Psoriasis 08/23/2013  . Chronic acquired lymphedema 08/21/2013  . Breast cancer, right breast ([place]) 12/26/2011  . Hypertension   . Carcinoma in situ of cervix   . Ovarian cyst   . Cancer ([place])   . Pituitary microadenoma ([place])     [place] 08/17/2017, 9:01 AM  [place], [place], [address] Phone: [phone]   Fax:  [phone]  Name: [name] Gibson MRN: [id number] Date of Birth: June 16, 1956  Serafina Royals, PT 08/17/17 9:01 AM

## 2017-08-20 ENCOUNTER — Ambulatory Visit: Payer: 59 | Admitting: Physical Therapy

## 2017-08-20 ENCOUNTER — Encounter: Payer: Self-pay | Admitting: Physical Therapy

## 2017-08-20 DIAGNOSIS — I972 Postmastectomy lymphedema syndrome: Secondary | ICD-10-CM | POA: Diagnosis not present

## 2017-08-20 DIAGNOSIS — G8929 Other chronic pain: Secondary | ICD-10-CM | POA: Diagnosis not present

## 2017-08-20 DIAGNOSIS — M25511 Pain in right shoulder: Secondary | ICD-10-CM | POA: Diagnosis not present

## 2017-08-20 NOTE — Therapy (Signed)
[place], [place], [address] Phone: [phone]   Fax:  [phone]  Physical Therapy Treatment  Patient Details  Name: [name] Gibson MRN: [id number] Date of Birth: 10/28/1955 Referring Provider: Thedore Mins   Encounter Date: 08/20/2017  PT End of Session - 08/20/17 1506    Visit Number  11    Number of Visits  25    Date for PT Re-Evaluation  10/10/17    PT Start Time  1355    PT Stop Time  1455    PT Time Calculation (min)  60 min    Activity Tolerance  Patient tolerated treatment well    Behavior During Therapy  [facility] for tasks assessed/performed       Past Medical History:  Diagnosis Date  . Cellulitis of arm, right 08/21/2013  . Complication of anesthesia    some nausea after second mastectomy surgery  . Hypertension   . Ovarian cyst   . Pituitary microadenoma ([place])   . Psoriasis     Past Surgical History:  Procedure Laterality Date  . Anal Fistula Repair    . BREAST SURGERY     Right Mastectomy  . CERVICAL CONE BIOPSY    . CHOLECYSTECTOMY    . COLPOSCOPY    . TONSILLECTOMY      There were no vitals filed for this visit.  Subjective Assessment - 08/20/17 1358    Subjective  Stayed wrapped. It seems like my arm is doing better but we need to wrap my fingers really well. They're really swollen.    Pertinent History  Right breast diagnosed in May 2009. Had mastectomy, chemo, and radiation; is on tamoxifen for 9 years.  HTN controlled with meds.  Lymphedema started about one year out from treatment; she has been treated here on more than one occasion in the past.    Patient Stated Goals  Reduce arm swelling    Currently in Pain?  No/denies            LYMPHEDEMA/ONCOLOGY QUESTIONNAIRE - 08/20/17 1400      Right Upper Extremity Lymphedema   15 cm Proximal to Olecranon Process  43.7 cm  (Pended)     10 cm Proximal to Olecranon Process  42 cm  (Pended)     Olecranon Process   36.5 cm  (Pended)     15 cm Proximal to Ulnar Styloid Process  36.3 cm  (Pended)     10 cm Proximal to Ulnar Styloid Process  33.8 cm  (Pended)     Just Proximal to Ulnar Styloid Process  26.7 cm  (Pended)     Across Hand at [employer]  23.3 cm  (Pended)     At [place] of 2nd Digit  7.8 cm  (Pended)                [facility] Adult PT Treatment/Exercise - 08/20/17 0001      Manual Therapy   Edema Management  removed bandages and checked skin, which looks good.     Manual Lymphatic Drainage (MLD)  short neck, superficial and deep abdominals, left axillary nodes and establishment of interaxillary pathway, right inguinal nodes and establishment of axillo inguinal pathway, R UE working proximal to distal (to fingers) then retracing all steps    Compression Bandaging  Lotion applied then TG soft from hand to axilla, Elastomull to all fingers only to PIP and then a reverse wrapping with elastomull was applied to try to  better control finger swelling, artiflex x2 from hand to axilla, then 1-6 cm bandage, 2-10 cm bandage, 4-12 cm bandage, with two of the last three 12 cm. bandages in herringbone (one on forearm, one on upper arm), overall from hand to axilla               PT Short Term Goals - 08/10/17 0951      PT SHORT TERM GOAL #1   Title  Patient's right arm circumference at 10 cm. proximal to olecranon will reduce to 48 cm.    Baseline  51.1 cm. on eval compared to 37.7 on the left    Status  Achieved      PT SHORT TERM GOAL #2   Title  Pt.'s right arm circumference at 15 cm. proximal to ulnar styloid will reduceto 42 cm.    Baseline  45.2 cm. right compared to 29.2 on left    Status  Achieved        PT Long Term Goals - 08/20/17 1509      PT LONG TERM GOAL #1   Title  Patient's right arm circumference at 10 cm. proximal to olecranon will reduce to 45 cm. or less.    Baseline  51.1 cm. compared to 37.7 on left at eval    Time  8    Period  Weeks    Status  Achieved       PT LONG TERM GOAL #2   Title  Patient's right arm circumference at 15 cm. proximal to ulnar styloid will reduce to 39 cm. or less.    Baseline  45.2 cm. on right compared to 29.2 on left at eval.    Time  8    Period  Weeks      PT LONG TERM GOAL #3   Title  Pt. will have new custom fit compression sleeve and glove.    Time  8    Period  Weeks    Status  On-going      PT LONG TERM GOAL #4   Title  Pt. will have a plan in place for lymphedema self-management    Time  8    Period  Weeks    Status  On-going      PT LONG TERM GOAL #5   Title  Patient's right arm circumference at 15 cm. proximal to ulnar styloid will reduce to 34 cm. or less.    Time  8    Period  Weeks    Status  New      Additional Long Term Goals   Additional Long Term Goals  Yes      PT LONG TERM GOAL #6   Title  Patient's right arm circumference at 10 cm. proximal to olecranon will reduce to 40 cm. or less.    Time  8    Period  Weeks    Status  New            Plan - 08/20/17 1506    Clinical Impression Statement  Patient's measurements were taken again today and she continues to reduce in arm circumference. She is pleased with this and her skin is more mobile and softer. She reports being able to tolerate staying wrapped easier this time compared to previous years.    Rehab Potential  Good    PT Frequency  3x / week    PT Duration  8 weeks    PT Treatment/Interventions  ADLs/Self Care Home Management;DME Instruction;Therapeutic exercise;Patient/family  education;Orthotic Fit/Training;Manual techniques;Manual lymph drainage;Compression bandaging;Passive range of motion;Taping    PT Next Visit Plan  continue complete decongestive therapy; later, suggest custom flatknit compression garments.    Consulted and Agree with Plan of Care  Patient       Patient will benefit from skilled therapeutic intervention in order to improve the following deficits and impairments:  Increased edema, Decreased  knowledge of use of DME  Visit Diagnosis: Postmastectomy lymphedema - Plan: PT plan of care cert/re-cert  Chronic right shoulder pain - Plan: PT plan of care cert/re-cert     Problem List Patient Active Problem List   Diagnosis Date Noted  . Psoriasis 08/23/2013  . Chronic acquired lymphedema 08/21/2013  . Breast cancer, right breast ([place]) 12/26/2011  . Hypertension   . Carcinoma in situ of cervix   . Ovarian cyst   . Cancer ([place])   . Pituitary microadenoma [facility])    [place] Friend[place], [place] 08/20/17 3:17 PM  [place], [place], [address] Phone: [phone]   Fax:  [phone]  Name: OLANDA BOUGHNER MRN: [id number] Date of Birth: March 23, 1956

## 2017-08-22 ENCOUNTER — Ambulatory Visit: Payer: 59 | Admitting: Physical Therapy

## 2017-08-22 DIAGNOSIS — M25511 Pain in right shoulder: Secondary | ICD-10-CM | POA: Diagnosis not present

## 2017-08-22 DIAGNOSIS — G8929 Other chronic pain: Secondary | ICD-10-CM | POA: Diagnosis not present

## 2017-08-22 DIAGNOSIS — I972 Postmastectomy lymphedema syndrome: Secondary | ICD-10-CM

## 2017-08-22 NOTE — Therapy (Signed)
[place], [place], [address] Phone: [phone]   Fax:  [phone]  Physical Therapy Treatment  Patient Details  Name: [name] Gibson MRN: [id number] Date of Birth: 28-May-1956 Referring Provider: Thedore Mins   Encounter Date: 08/22/2017  PT End of Session - 08/22/17 1718    Visit Number  12    Number of Visits  25    Date for PT Re-Evaluation  10/10/17    PT Start Time  4580    PT Stop Time  1707    PT Time Calculation (min)  57 min    Activity Tolerance  Patient tolerated treatment well    Behavior During Therapy  [facility] for tasks assessed/performed       Past Medical History:  Diagnosis Date  . Cellulitis of arm, right 08/21/2013  . Complication of anesthesia    some nausea after second mastectomy surgery  . Hypertension   . Ovarian cyst   . Pituitary microadenoma ([place])   . Psoriasis     Past Surgical History:  Procedure Laterality Date  . Anal Fistula Repair    . BREAST SURGERY     Right Mastectomy  . CERVICAL CONE BIOPSY    . CHOLECYSTECTOMY    . COLPOSCOPY    . TONSILLECTOMY      There were no vitals filed for this visit.  Subjective Assessment - 08/22/17 1619    Subjective  Fingers felt better with the double wrap (and the Neosporin).    Pertinent History  Right breast diagnosed in May 2009. Had mastectomy, chemo, and radiation; is on tamoxifen for 9 years.  HTN controlled with meds.  Lymphedema started about one year out from treatment; she has been treated here on more than one occasion in the past.    Currently in Pain?  No/denies                      [facility] Adult PT Treatment/Exercise - 08/22/17 0001      Manual Therapy   Edema Management  removed bandages and checked skin, which looks good.     Manual Lymphatic Drainage (MLD)  short neck, superficial and deep abdominals, left axillary nodes and establishment of interaxillary pathway, right inguinal nodes  and establishment of axillo inguinal pathway, R UE working proximal to distal (to fingers) then retracing all steps    Compression Bandaging  Lotion applied then TG soft from hand to axilla, Elastomull to all fingers only to PIP and then a reverse wrapping with elastomull was applied to try to better control finger swelling, artiflex x2 from hand to axilla, then 1-6 cm, 1-8 cm bandage, 2-10 cm bandage, 3-12 cm bandage, with two of the last three 12 cm. bandages in herringbone (one on forearm, one on upper arm), overall from hand to axilla               PT Short Term Goals - 08/10/17 0951      PT SHORT TERM GOAL #1   Title  Patient's right arm circumference at 10 cm. proximal to olecranon will reduce to 48 cm.    Baseline  51.1 cm. on eval compared to 37.7 on the left    Status  Achieved      PT SHORT TERM GOAL #2   Title  Pt.'s right arm circumference at 15 cm. proximal to ulnar styloid will reduceto 42 cm.    Baseline  45.2 cm. right compared to 29.2  on left    Status  Achieved        PT Long Term Goals - 08/20/17 1509      PT LONG TERM GOAL #1   Title  Patient's right arm circumference at 10 cm. proximal to olecranon will reduce to 45 cm. or less.    Baseline  51.1 cm. compared to 37.7 on left at eval    Time  8    Period  Weeks    Status  Achieved      PT LONG TERM GOAL #2   Title  Patient's right arm circumference at 15 cm. proximal to ulnar styloid will reduce to 39 cm. or less.    Baseline  45.2 cm. on right compared to 29.2 on left at eval.    Time  8    Period  Weeks      PT LONG TERM GOAL #3   Title  Pt. will have new custom fit compression sleeve and glove.    Time  8    Period  Weeks    Status  On-going      PT LONG TERM GOAL #4   Title  Pt. will have a plan in place for lymphedema self-management    Time  8    Period  Weeks    Status  On-going      PT LONG TERM GOAL #5   Title  Patient's right arm circumference at 15 cm. proximal to ulnar styloid  will reduce to 34 cm. or less.    Time  8    Period  Weeks    Status  New      Additional Long Term Goals   Additional Long Term Goals  Yes      PT LONG TERM GOAL #6   Title  Patient's right arm circumference at 10 cm. proximal to olecranon will reduce to 40 cm. or less.    Time  8    Period  Weeks    Status  New            Plan - 08/22/17 1718    Clinical Impression Statement  Continuing complete decongestive therapy and tolerating bandaging well.    Rehab Potential  Good    PT Frequency  3x / week    PT Duration  8 weeks    PT Treatment/Interventions  ADLs/Self Care Home Management;DME Instruction;Therapeutic exercise;Patient/family education;Orthotic Fit/Training;Manual techniques;Manual lymph drainage;Compression bandaging;Passive range of motion;Taping    PT Next Visit Plan  continue complete decongestive therapy; later, suggest custom flatknit compression garments.    PT Home Exercise Plan  okay to do her workout at [employer] (treadmill, bike, 30-minute circuit room) with bandages on    Consulted and Agree with Plan of Care  Patient       Patient will benefit from skilled therapeutic intervention in order to improve the following deficits and impairments:  Increased edema, Decreased knowledge of use of DME  Visit Diagnosis: Postmastectomy lymphedema     Problem List Patient Active Problem List   Diagnosis Date Noted  . Psoriasis 08/23/2013  . Chronic acquired lymphedema 08/21/2013  . Breast cancer, right breast ([place]) 12/26/2011  . Hypertension   . Carcinoma in situ of cervix   . Ovarian cyst   . Cancer ([place])   . Pituitary microadenoma ([place])     [place] 08/22/2017, 5:20 PM  [place], [place], [address] Phone: [phone]   Fax:  [phone]  Name: [name] Gibson MRN: [id number] Date of Birth: 1956/05/31  Serafina Royals, PT 08/22/17 5:20 PM

## 2017-08-24 ENCOUNTER — Ambulatory Visit: Payer: 59 | Attending: Oncology | Admitting: Physical Therapy

## 2017-08-24 ENCOUNTER — Encounter: Payer: Self-pay | Admitting: Physical Therapy

## 2017-08-24 DIAGNOSIS — I972 Postmastectomy lymphedema syndrome: Secondary | ICD-10-CM | POA: Insufficient documentation

## 2017-08-24 DIAGNOSIS — M25511 Pain in right shoulder: Secondary | ICD-10-CM | POA: Diagnosis not present

## 2017-08-24 DIAGNOSIS — G8929 Other chronic pain: Secondary | ICD-10-CM | POA: Diagnosis not present

## 2017-08-24 NOTE — Therapy (Signed)
[place], [place], [address] Phone: [phone]   Fax:  [phone]  Physical Therapy Treatment  Patient Details  Name: [name] Gibson MRN: [id number] Date of Birth: 1956-06-30 Referring Provider: Thedore Mins   Encounter Date: 08/24/2017  PT End of Session - 08/24/17 1133    Visit Number  13    Number of Visits  25    Date for PT Re-Evaluation  10/10/17    PT Start Time  0803    PT Stop Time  0855    PT Time Calculation (min)  52 min    Activity Tolerance  Patient tolerated treatment well    Behavior During Therapy  [facility] for tasks assessed/performed       Past Medical History:  Diagnosis Date  . Cellulitis of arm, right 08/21/2013  . Complication of anesthesia    some nausea after second mastectomy surgery  . Hypertension   . Ovarian cyst   . Pituitary microadenoma ([place])   . Psoriasis     Past Surgical History:  Procedure Laterality Date  . Anal Fistula Repair    . BREAST SURGERY     Right Mastectomy  . CERVICAL CONE BIOPSY    . CHOLECYSTECTOMY    . COLPOSCOPY    . TONSILLECTOMY      There were no vitals filed for this visit.  Subjective Assessment - 08/24/17 1123    Subjective  pt reports she had pain in her hand and especailly thumb last night that "feels like carpal tunnel"  she took her bandages off at 4:30 am     Pertinent History  Right breast diagnosed in May 2009. Had mastectomy, chemo, and radiation; is on tamoxifen for 9 years.  HTN controlled with meds.  Lymphedema started about one year out from treatment; she has been treated here on more than one occasion in the past.    Patient Stated Goals  Reduce arm swelling    Currently in Pain?  Yes    Pain Score  -- did not rate     Pain Location  -- thumb    Pain Orientation  Right    Pain Descriptors / Indicators  Aching    Pain Type  Acute pain    Pain Onset  Today    Aggravating Factors   bandages     Pain Relieving  Factors  removed bandages                       [facility] Adult PT Treatment/Exercise - 08/24/17 0001      Manual Therapy   Edema Management  enouraged elevation and exercise of shoulder , forearm, wrist and hand     Manual Lymphatic Drainage (MLD)  short neck, superficial and deep abdominals, left axillary nodes and establishment of interaxillary pathway, right inguinal nodes and establishment of axillo inguinal pathway, R UE working proximal to distal (to fingers) then retracing all steps    Compression Bandaging  Lotion applied then TG soft from hand to axilla,thin foam with hole cut out for thumb applies to palmer surface from thumb to just above wrist  Elastomull x 2  to all fingers with third piece around MCP joints and hand artiflex x2 from hand to axilla, then 1-6 cm, 1-8 cm bandage, 2-10 cm bandage, 3-12 cm bandage, with two of the last three 12 cm. bandages in herringbone (one on forearm, one on upper arm), overall from hand to axilla  PT Short Term Goals - 08/10/17 0951      PT SHORT TERM GOAL #1   Title  Patient's right arm circumference at 10 cm. proximal to olecranon will reduce to 48 cm.    Baseline  51.1 cm. on eval compared to 37.7 on the left    Status  Achieved      PT SHORT TERM GOAL #2   Title  Pt.'s right arm circumference at 15 cm. proximal to ulnar styloid will reduceto 42 cm.    Baseline  45.2 cm. right compared to 29.2 on left    Status  Achieved        PT Long Term Goals - 08/20/17 1509      PT LONG TERM GOAL #1   Title  Patient's right arm circumference at 10 cm. proximal to olecranon will reduce to 45 cm. or less.    Baseline  51.1 cm. compared to 37.7 on left at eval    Time  8    Period  Weeks    Status  Achieved      PT LONG TERM GOAL #2   Title  Patient's right arm circumference at 15 cm. proximal to ulnar styloid will reduce to 39 cm. or less.    Baseline  45.2 cm. on right compared to 29.2 on left at eval.     Time  8    Period  Weeks      PT LONG TERM GOAL #3   Title  Pt. will have new custom fit compression sleeve and glove.    Time  8    Period  Weeks    Status  On-going      PT LONG TERM GOAL #4   Title  Pt. will have a plan in place for lymphedema self-management    Time  8    Period  Weeks    Status  On-going      PT LONG TERM GOAL #5   Title  Patient's right arm circumference at 15 cm. proximal to ulnar styloid will reduce to 34 cm. or less.    Time  8    Period  Weeks    Status  New      Additional Long Term Goals   Additional Long Term Goals  Yes      PT LONG TERM GOAL #6   Title  Patient's right arm circumference at 10 cm. proximal to olecranon will reduce to 40 cm. or less.    Time  8    Period  Weeks    Status  New            Plan - 08/24/17 1134    Clinical Impression Statement  pt continues to had full swelling in hand with some pain in thumb with bandaging.  Added foam piece today to protect this area.  Pt pleased with reduction in her forearm     PT Frequency  3x / week    PT Duration  8 weeks    PT Next Visit Plan  Pt is interested in getting garement measuarement scheduled and learning more about a new type of nighttime garment that will be easier for her to sleep. continue complete decongestive therapy;     PT Home Exercise Plan  okay to do her workout at [employer] (treadmill, bike, 30-minute circuit room) with bandages on    Consulted and Agree with Plan of Care  Patient       Patient will benefit from  skilled therapeutic intervention in order to improve the following deficits and impairments:  Increased edema, Decreased knowledge of use of DME  Visit Diagnosis: Postmastectomy lymphedema  Chronic right shoulder pain     Problem List Patient Active Problem List   Diagnosis Date Noted  . Psoriasis 08/23/2013  . Chronic acquired lymphedema 08/21/2013  . Breast cancer, right breast ([place]) 12/26/2011  . Hypertension   . Carcinoma in situ of  cervix   . Ovarian cyst   . Cancer ([place])   . Pituitary microadenoma ([place])    Donato Heinz. Owens Shark PT  [place] 08/24/2017, 11:37 AM  [place], [place], [address] Phone: [phone]   Fax:  [phone]  Name: [name] Gibson MRN: [id number] Date of Birth: 08/22/1955

## 2017-08-27 ENCOUNTER — Ambulatory Visit: Payer: 59 | Admitting: Physical Therapy

## 2017-08-27 DIAGNOSIS — G8929 Other chronic pain: Secondary | ICD-10-CM | POA: Diagnosis not present

## 2017-08-27 DIAGNOSIS — M25511 Pain in right shoulder: Secondary | ICD-10-CM | POA: Diagnosis not present

## 2017-08-27 DIAGNOSIS — I972 Postmastectomy lymphedema syndrome: Secondary | ICD-10-CM

## 2017-08-27 NOTE — Therapy (Signed)
[place], [place], [address] Phone: [phone]   Fax:  [phone]  Physical Therapy Treatment  Patient Details  Name: [name] Gibson MRN: [id number] Date of Birth: 01/08/1956 Referring Provider: Thedore Mins   Encounter Date: 08/27/2017  PT End of Session - 08/27/17 1709    Visit Number  14    Number of Visits  25    Date for PT Re-Evaluation  10/10/17    PT Start Time  0093    PT Stop Time  1702    PT Time Calculation (min)  67 min    Activity Tolerance  Patient tolerated treatment well    Behavior During Therapy  [facility] for tasks assessed/performed       Past Medical History:  Diagnosis Date  . Cellulitis of arm, right 08/21/2013  . Complication of anesthesia    some nausea after second mastectomy surgery  . Hypertension   . Ovarian cyst   . Pituitary microadenoma ([place])   . Psoriasis     Past Surgical History:  Procedure Laterality Date  . Anal Fistula Repair    . BREAST SURGERY     Right Mastectomy  . CERVICAL CONE BIOPSY    . CHOLECYSTECTOMY    . COLPOSCOPY    . TONSILLECTOMY      There were no vitals filed for this visit.  Subjective Assessment - 08/27/17 1558    Subjective  I think I need to have it a little looser compared to the last time you wrapped it.    Pertinent History  Right breast diagnosed in May 2009. Had mastectomy, chemo, and radiation; is on tamoxifen for 9 years.  HTN controlled with meds.  Lymphedema started about one year out from treatment; she has been treated here on more than one occasion in the past.    Currently in Pain?  No/denies            LYMPHEDEMA/ONCOLOGY QUESTIONNAIRE - 08/27/17 1607      Right Upper Extremity Lymphedema   15 cm Proximal to Olecranon Process  43.2 cm    10 cm Proximal to Olecranon Process  43.2 cm    Olecranon Process  35.8 cm    15 cm Proximal to Ulnar Styloid Process  37.2 cm    10 cm Proximal to Ulnar Styloid  Process  34.3 cm    Just Proximal to Ulnar Styloid Process  24.8 cm    Across Hand at [employer]  21.6 cm    At [place] of 2nd Digit  7.2 cm               [facility] Adult PT Treatment/Exercise - 08/27/17 0001      Manual Therapy   Edema Management  removed bandages and checked skin, which looks good; circumference measurements taken    Manual Lymphatic Drainage (MLD)  short neck, superficial and deep abdominals, left axillary nodes and establishment of interaxillary pathway, right inguinal nodes and establishment of axillo inguinal pathway, R UE working proximal to distal (to fingers) then retracing all steps    Compression Bandaging  Lotion applied then TG soft from hand to axilla, thin foam with hole cut out for thumb applied to palmer surface from thumb to just above wrist  Elastomull x 2  to all fingers with third piece around MCP joints and hand artiflex x2 from hand to axilla, then 1-6 cm. bandage, 2-10 cm bandage, 3-12 cm bandage, with two of the last  three 12 cm. bandages in herringbone (one on forearm, one on upper arm), overall from hand to axilla               PT Short Term Goals - 08/10/17 0951      PT SHORT TERM GOAL #1   Title  Patient's right arm circumference at 10 cm. proximal to olecranon will reduce to 48 cm.    Baseline  51.1 cm. on eval compared to 37.7 on the left    Status  Achieved      PT SHORT TERM GOAL #2   Title  Pt.'s right arm circumference at 15 cm. proximal to ulnar styloid will reduceto 42 cm.    Baseline  45.2 cm. right compared to 29.2 on left    Status  Achieved        PT Long Term Goals - 08/20/17 1509      PT LONG TERM GOAL #1   Title  Patient's right arm circumference at 10 cm. proximal to olecranon will reduce to 45 cm. or less.    Baseline  51.1 cm. compared to 37.7 on left at eval    Time  8    Period  Weeks    Status  Achieved      PT LONG TERM GOAL #2   Title  Patient's right arm circumference at 15 cm. proximal to  ulnar styloid will reduce to 39 cm. or less.    Baseline  45.2 cm. on right compared to 29.2 on left at eval.    Time  8    Period  Weeks      PT LONG TERM GOAL #3   Title  Pt. will have new custom fit compression sleeve and glove.    Time  8    Period  Weeks    Status  On-going      PT LONG TERM GOAL #4   Title  Pt. will have a plan in place for lymphedema self-management    Time  8    Period  Weeks    Status  On-going      PT LONG TERM GOAL #5   Title  Patient's right arm circumference at 15 cm. proximal to ulnar styloid will reduce to 34 cm. or less.    Time  8    Period  Weeks    Status  New      Additional Long Term Goals   Additional Long Term Goals  Yes      PT LONG TERM GOAL #6   Title  Patient's right arm circumference at 10 cm. proximal to olecranon will reduce to 40 cm. or less.    Time  8    Period  Weeks    Status  New            Plan - 08/27/17 1710    Clinical Impression Statement  Pt. had nice reductions of finger, hand, wrist and elbow circumference measurements; slight increases elsewhere.  She would like to keep the course of treatment as short as possible, even if that means measuring before she has reached maximal reduction.  Began process of getting measured by contacting vendor [employer]) today.    Rehab Potential  Good    PT Frequency  3x / week    PT Duration  8 weeks    PT Treatment/Interventions  ADLs/Self Care Home Management;DME Instruction;Therapeutic exercise;Patient/family education;Orthotic Fit/Training;Manual techniques;Manual lymph drainage;Compression bandaging;Passive range of motion;Taping    PT Next Visit  Plan  Pt is interested in getting garement measurement scheduled and learning more about a new type of nighttime garment that will be easier for her to sleep. Continue complete decongestive therapy;     PT Home Exercise Plan  okay to do her workout at [employer] (treadmill, bike, 30-minute circuit room) with bandages on     Recommended Other Services  emailed [employer] and faxed face sheet to her re: measuring    Consulted and Agree with Plan of Care  Patient       Patient will benefit from skilled therapeutic intervention in order to improve the following deficits and impairments:  Increased edema, Decreased knowledge of use of DME  Visit Diagnosis: Postmastectomy lymphedema     Problem List Patient Active Problem List   Diagnosis Date Noted  . Psoriasis 08/23/2013  . Chronic acquired lymphedema 08/21/2013  . Breast cancer, right breast ([place]) 12/26/2011  . Hypertension   . Carcinoma in situ of cervix   . Ovarian cyst   . Cancer ([place])   . Pituitary microadenoma ([place])     [place] 08/27/2017, 5:13 PM  [place], [place], [address] Phone: [phone]   Fax:  [phone]  Name: HETTY LINHART MRN: [id number] Date of Birth: 1956-04-30  Serafina Royals, PT 08/27/17 5:13 PM

## 2017-08-29 ENCOUNTER — Ambulatory Visit: Payer: 59 | Admitting: Physical Therapy

## 2017-08-29 DIAGNOSIS — M25511 Pain in right shoulder: Secondary | ICD-10-CM | POA: Diagnosis not present

## 2017-08-29 DIAGNOSIS — I972 Postmastectomy lymphedema syndrome: Secondary | ICD-10-CM

## 2017-08-29 DIAGNOSIS — G8929 Other chronic pain: Secondary | ICD-10-CM | POA: Diagnosis not present

## 2017-08-29 NOTE — Therapy (Signed)
[place], [place], [address] Phone: [phone]   Fax:  [phone]  Physical Therapy Treatment  Patient Details  Name: [name] Gibson MRN: [id number] Date of Birth: 04-19-1956 Referring Provider: Thedore Mins   Encounter Date: 08/29/2017  PT End of Session - 08/29/17 1726    Visit Number  15    Date for PT Re-Evaluation  10/10/17    PT Start Time  1612    PT Stop Time  1703    PT Time Calculation (min)  51 min    Activity Tolerance  Patient tolerated treatment well    Behavior During Therapy  [facility] for tasks assessed/performed       Past Medical History:  Diagnosis Date  . Cellulitis of arm, right 08/21/2013  . Complication of anesthesia    some nausea after second mastectomy surgery  . Hypertension   . Ovarian cyst   . Pituitary microadenoma ([place])   . Psoriasis     Past Surgical History:  Procedure Laterality Date  . Anal Fistula Repair    . BREAST SURGERY     Right Mastectomy  . CERVICAL CONE BIOPSY    . CHOLECYSTECTOMY    . COLPOSCOPY    . TONSILLECTOMY      There were no vitals filed for this visit.  Subjective Assessment - 08/29/17 1614    Subjective  Nothing new to report.    Pertinent History  Right breast diagnosed in May 2009. Had mastectomy, chemo, and radiation; is on tamoxifen for 9 years.  HTN controlled with meds.  Lymphedema started about one year out from treatment; she has been treated here on more than one occasion in the past.    Currently in Pain?  No/denies                      [facility] Adult PT Treatment/Exercise - 08/29/17 0001      Manual Therapy   Edema Management  removed bandages and checked skin, which looks good.   (Pended)     Manual Lymphatic Drainage (MLD)  short neck, superficial and deep abdominals, left axillary nodes and establishment of interaxillary pathway, right inguinal nodes and establishment of axillo inguinal pathway, R UE  working proximal to distal (to fingers) then retracing all steps  (Pended)     Compression Bandaging  Lotion applied then TG soft from hand to axilla, thin foam with hole cut out for thumb applied to palmer surface from thumb to just above wrist  Elastomull x 2  to all fingers with third piece around MCP joints and hand artiflex x2 from hand to axilla, then 1-6 cm. bandage, 2-10 cm bandage, 3-12 cm bandage, with two of the last three 12 cm. bandages in herringbone (one on forearm, one on upper arm), overall from hand to axilla  (Pended)                PT Short Term Goals - 08/10/17 0951      PT SHORT TERM GOAL #1   Title  Patient's right arm circumference at 10 cm. proximal to olecranon will reduce to 48 cm.    Baseline  51.1 cm. on eval compared to 37.7 on the left    Status  Achieved      PT SHORT TERM GOAL #2   Title  Pt.'s right arm circumference at 15 cm. proximal to ulnar styloid will reduceto 42 cm.    Baseline  45.2 cm.  right compared to 29.2 on left    Status  Achieved        PT Long Term Goals - 08/20/17 1509      PT LONG TERM GOAL #1   Title  Patient's right arm circumference at 10 cm. proximal to olecranon will reduce to 45 cm. or less.    Baseline  51.1 cm. compared to 37.7 on left at eval    Time  8    Period  Weeks    Status  Achieved      PT LONG TERM GOAL #2   Title  Patient's right arm circumference at 15 cm. proximal to ulnar styloid will reduce to 39 cm. or less.    Baseline  45.2 cm. on right compared to 29.2 on left at eval.    Time  8    Period  Weeks      PT LONG TERM GOAL #3   Title  Pt. will have new custom fit compression sleeve and glove.    Time  8    Period  Weeks    Status  On-going      PT LONG TERM GOAL #4   Title  Pt. will have a plan in place for lymphedema self-management    Time  8    Period  Weeks    Status  On-going      PT LONG TERM GOAL #5   Title  Patient's right arm circumference at 15 cm. proximal to ulnar styloid  will reduce to 34 cm. or less.    Time  8    Period  Weeks    Status  New      Additional Long Term Goals   Additional Long Term Goals  Yes      PT LONG TERM GOAL #6   Title  Patient's right arm circumference at 10 cm. proximal to olecranon will reduce to 40 cm. or less.    Time  8    Period  Weeks    Status  New            Plan - 08/29/17 1726    Clinical Impression Statement  Pt. doing well.  Arm tissue feels soft. Expect her to be measured for garments on 09/03/17.    Rehab Potential  Good    PT Frequency  3x / week    PT Duration  8 weeks    PT Treatment/Interventions  ADLs/Self Care Home Management;DME Instruction;Therapeutic exercise;Patient/family education;Orthotic Fit/Training;Manual techniques;Manual lymph drainage;Compression bandaging;Passive range of motion;Taping    PT Next Visit Plan  Pt is interested in learning more about a new type of nighttime garment that will be easier for her to sleep. Continue complete decongestive therapy; should be measured for garments 09/03/17.    PT Home Exercise Plan  okay to do her workout at [employer] (treadmill, bike, 30-minute circuit room) with bandages on    Consulted and Agree with Plan of Care  Patient       Patient will benefit from skilled therapeutic intervention in order to improve the following deficits and impairments:  Increased edema, Decreased knowledge of use of DME  Visit Diagnosis: Postmastectomy lymphedema     Problem List Patient Active Problem List   Diagnosis Date Noted  . Psoriasis 08/23/2013  . Chronic acquired lymphedema 08/21/2013  . Breast cancer, right breast ([place]) 12/26/2011  . Hypertension   . Carcinoma in situ of cervix   . Ovarian cyst   . Cancer ([place])   .  Pituitary microadenoma ([place])     [place] 08/29/2017, 5:28 PM  [address], [place], [address] Phone: [phone]   Fax:   [phone]  Name: [name] Gibson MRN: [id number] Date of Birth: [birth year]  Serafina Royals, PT 08/29/17 5:28 PM

## 2017-08-31 ENCOUNTER — Encounter: Payer: Self-pay | Admitting: Physical Therapy

## 2017-08-31 ENCOUNTER — Ambulatory Visit: Payer: 59 | Admitting: Physical Therapy

## 2017-08-31 DIAGNOSIS — G8929 Other chronic pain: Secondary | ICD-10-CM | POA: Diagnosis not present

## 2017-08-31 DIAGNOSIS — I972 Postmastectomy lymphedema syndrome: Secondary | ICD-10-CM

## 2017-08-31 DIAGNOSIS — M25511 Pain in right shoulder: Secondary | ICD-10-CM | POA: Diagnosis not present

## 2017-08-31 NOTE — Therapy (Signed)
[place], [place], [address] Phone: [phone]   Fax:  [phone]  Physical Therapy Treatment  Patient Details  Name: [name] Gibson MRN: [id number] Date of Birth: [birth year] Referring Provider: Thedore Mins   Encounter Date: 08/31/2017  PT End of Session - 08/31/17 0848    Visit Number  16    Number of Visits  25    Date for PT Re-Evaluation  10/10/17    PT Start Time  0803    PT Stop Time  0846    PT Time Calculation (min)  43 min    Activity Tolerance  Patient tolerated treatment well    Behavior During Therapy  [facility] for tasks assessed/performed       Past Medical History:  Diagnosis Date  . Cellulitis of arm, right 08/21/2013  . Complication of anesthesia    some nausea after second mastectomy surgery  . Hypertension   . Ovarian cyst   . Pituitary microadenoma ([place])   . Psoriasis     Past Surgical History:  Procedure Laterality Date  . Anal Fistula Repair    . BREAST SURGERY     Right Mastectomy  . CERVICAL CONE BIOPSY    . CHOLECYSTECTOMY    . COLPOSCOPY    . TONSILLECTOMY      There were no vitals filed for this visit.  Subjective Assessment - 08/31/17 0804    Subjective  No changes.    Pertinent History  Right breast diagnosed in May 2009. Had mastectomy, chemo, and radiation; is on tamoxifen for 9 years.  HTN controlled with meds.  Lymphedema started about one year out from treatment; she has been treated here on more than one occasion in the past.    Patient Stated Goals  Reduce arm swelling    Currently in Pain?  No/denies    Pain Score  0-No pain                      [facility] Adult PT Treatment/Exercise - 08/31/17 0001      Manual Therapy   Edema Management  removed bandages and checked skin, which looks good.     Manual Lymphatic Drainage (MLD)  short neck, superficial and deep abdominals, left axillary nodes and establishment of interaxillary pathway,  right inguinal nodes and establishment of axillo inguinal pathway, R UE working proximal to distal (to fingers) then retracing all steps    Compression Bandaging  Lotion applied then TG soft from hand to axilla, thin foam with hole cut out for thumb applied to palmer surface from thumb to just above wrist  Elastomull x 2  to all fingers with third piece around MCP joints and hand artiflex x2 from hand to axilla, then 1-6 cm. bandage, 2-10 cm bandage, 3-12 cm bandage, with two of the last three 12 cm. bandages in herringbone (one on forearm, one on upper arm), overall from hand to axilla             PT Education - 08/31/17 0849    Education provided  Yes    Education Details  educated pt briefly about [employer] time [employer] consultant) Educated  Patient    Methods  Explanation    Comprehension  Verbalized understanding       PT Short Term Goals - 08/10/17 0951      PT SHORT TERM GOAL #1   Title  Patient's right arm circumference at 10  cm. proximal to olecranon will reduce to 48 cm.    Baseline  51.1 cm. on eval compared to 37.7 on the left    Status  Achieved      PT SHORT TERM GOAL #2   Title  Pt.'s right arm circumference at 15 cm. proximal to ulnar styloid will reduceto 42 cm.    Baseline  45.2 cm. right compared to 29.2 on left    Status  Achieved        PT Long Term Goals - 08/20/17 1509      PT LONG TERM GOAL #1   Title  Patient's right arm circumference at 10 cm. proximal to olecranon will reduce to 45 cm. or less.    Baseline  51.1 cm. compared to 37.7 on left at eval    Time  8    Period  Weeks    Status  Achieved      PT LONG TERM GOAL #2   Title  Patient's right arm circumference at 15 cm. proximal to ulnar styloid will reduce to 39 cm. or less.    Baseline  45.2 cm. on right compared to 29.2 on left at eval.    Time  8    Period  Weeks      PT LONG TERM GOAL #3   Title  Pt. will have new custom fit compression sleeve and glove.    Time  8     Period  Weeks    Status  On-going      PT LONG TERM GOAL #4   Title  Pt. will have a plan in place for lymphedema self-management    Time  8    Period  Weeks    Status  On-going      PT LONG TERM GOAL #5   Title  Patient's right arm circumference at 15 cm. proximal to ulnar styloid will reduce to 34 cm. or less.    Time  8    Period  Weeks    Status  New      Additional Long Term Goals   Additional Long Term Goals  Yes      PT LONG TERM GOAL #6   Title  Patient's right arm circumference at 10 cm. proximal to olecranon will reduce to 40 cm. or less.    Time  8    Period  Weeks    Status  New            Plan - 08/31/17 0848    Clinical Impression Statement  Continued with MLD and compression bandaging. Pt is going to be measured for her compression garments on Monday prior to her appointment. She continues to demonstrate good reduction of UE per visual estimate.     Rehab Potential  Good    PT Frequency  3x / week    PT Duration  8 weeks    PT Treatment/Interventions  ADLs/Self Care Home Management;DME Instruction;Therapeutic exercise;Patient/family education;Orthotic Fit/Training;Manual techniques;Manual lymph drainage;Compression bandaging;Passive range of motion;Taping    PT Next Visit Plan  Pt is interested in learning more about a new type of nighttime garment that will be easier for her to sleep. Continue complete decongestive therapy; should be measured for garments 09/03/17.    PT Home Exercise Plan  okay to do her workout at [employer] (treadmill, bike, 30-minute circuit room) with bandages on    Consulted and Agree with Plan of Care  Patient       Patient will  benefit from skilled therapeutic intervention in order to improve the following deficits and impairments:  Increased edema, Decreased knowledge of use of DME  Visit Diagnosis: Postmastectomy lymphedema     Problem List Patient Active Problem List   Diagnosis Date Noted  . Psoriasis 08/23/2013  .  Chronic acquired lymphedema 08/21/2013  . Breast cancer, right breast ([place]) 12/26/2011  . Hypertension   . Carcinoma in situ of cervix   . Ovarian cyst   . Cancer ([place])   . Pituitary microadenoma [facility])     Allyson Sabal [facility] 08/31/2017, 8:50 AM  [place], [place], [address] Phone: [phone]   Fax:  [phone]  Name: [name] Gibson MRN: [id number] Date of Birth: 12-10-1955  Manus Gunning, PT 08/31/17 8:50 AM

## 2017-09-03 ENCOUNTER — Encounter: Payer: Self-pay | Admitting: Physical Therapy

## 2017-09-03 ENCOUNTER — Ambulatory Visit: Payer: 59 | Admitting: Physical Therapy

## 2017-09-03 DIAGNOSIS — M25511 Pain in right shoulder: Secondary | ICD-10-CM | POA: Diagnosis not present

## 2017-09-03 DIAGNOSIS — I972 Postmastectomy lymphedema syndrome: Secondary | ICD-10-CM | POA: Diagnosis not present

## 2017-09-03 DIAGNOSIS — G8929 Other chronic pain: Secondary | ICD-10-CM | POA: Diagnosis not present

## 2017-09-03 NOTE — Therapy (Signed)
[place], [place], [address] Phone: [phone]   Fax:  [phone]  Physical Therapy Treatment  Patient Details  Name: [name] Gibson MRN: [id number] Date of Birth: 04/06/1956 Referring Provider: Thedore Mins   Encounter Date: 09/03/2017  PT End of Session - 09/03/17 1701    Visit Number  17    Number of Visits  25    Date for PT Re-Evaluation  10/10/17    PT Start Time  7510    PT Stop Time  1658    PT Time Calculation (min)  62 min    Activity Tolerance  Patient tolerated treatment well    Behavior During Therapy  [facility] for tasks assessed/performed       Past Medical History:  Diagnosis Date  . Cellulitis of arm, right 08/21/2013  . Complication of anesthesia    some nausea after second mastectomy surgery  . Hypertension   . Ovarian cyst   . Pituitary microadenoma ([place])   . Psoriasis     Past Surgical History:  Procedure Laterality Date  . Anal Fistula Repair    . BREAST SURGERY     Right Mastectomy  . CERVICAL CONE BIOPSY    . CHOLECYSTECTOMY    . COLPOSCOPY    . TONSILLECTOMY      There were no vitals filed for this visit.  Subjective Assessment - 09/03/17 1557    Subjective  My arm feels good.     Pertinent History  Right breast diagnosed in May 2009. Had mastectomy, chemo, and radiation; is on tamoxifen for 9 years.  HTN controlled with meds.  Lymphedema started about one year out from treatment; she has been treated here on more than one occasion in the past.    Patient Stated Goals  Reduce arm swelling    Currently in Pain?  No/denies    Pain Score  0-No pain            LYMPHEDEMA/ONCOLOGY QUESTIONNAIRE - 09/03/17 1559      Right Upper Extremity Lymphedema   15 cm Proximal to Olecranon Process  43.5 cm    10 cm Proximal to Olecranon Process  41 cm    Olecranon Process  36.2 cm    15 cm Proximal to Ulnar Styloid Process  34.5 cm    10 cm Proximal to Ulnar  Styloid Process  32.2 cm    Just Proximal to Ulnar Styloid Process  24 cm    Across Hand at [employer]  21.8 cm    At [place] of 2nd Digit  7.2 cm               [facility] Adult PT Treatment/Exercise - 09/03/17 0001      Manual Therapy   Manual Lymphatic Drainage (MLD)  performed by Shan Levans, PT observed by this PT: short neck, superficial and deep abdominals, left axillary nodes and establishment of interaxillary pathway, right inguinal nodes and establishment of axillo inguinal pathway, R UE working proximal to distal (to fingers) then retracing all steps    Compression Bandaging  performed by Shan Levans, PT with observation by this therapist: Lotion applied then TG soft from hand to axilla, thin foam with hole cut out for thumb applied to palmer surface from thumb to just above wrist  Elastomull x 2  to all fingers with third piece around MCP joints and hand artiflex x2 from hand to axilla, then 1-6 cm. bandage, 2-10 cm bandage, 3-12  cm bandage, with two of the last three 12 cm. bandages in herringbone (one on forearm, one on upper arm), overall from hand to axilla               PT Short Term Goals - 08/10/17 0951      PT SHORT TERM GOAL #1   Title  Patient's right arm circumference at 10 cm. proximal to olecranon will reduce to 48 cm.    Baseline  51.1 cm. on eval compared to 37.7 on the left    Status  Achieved      PT SHORT TERM GOAL #2   Title  Pt.'s right arm circumference at 15 cm. proximal to ulnar styloid will reduceto 42 cm.    Baseline  45.2 cm. right compared to 29.2 on left    Status  Achieved        PT Long Term Goals - 08/20/17 1509      PT LONG TERM GOAL #1   Title  Patient's right arm circumference at 10 cm. proximal to olecranon will reduce to 45 cm. or less.    Baseline  51.1 cm. compared to 37.7 on left at eval    Time  8    Period  Weeks    Status  Achieved      PT LONG TERM GOAL #2   Title  Patient's right arm circumference at 15 cm.  proximal to ulnar styloid will reduce to 39 cm. or less.    Baseline  45.2 cm. on right compared to 29.2 on left at eval.    Time  8    Period  Weeks      PT LONG TERM GOAL #3   Title  Pt. will have new custom fit compression sleeve and glove.    Time  8    Period  Weeks    Status  On-going      PT LONG TERM GOAL #4   Title  Pt. will have a plan in place for lymphedema self-management    Time  8    Period  Weeks    Status  On-going      PT LONG TERM GOAL #5   Title  Patient's right arm circumference at 15 cm. proximal to ulnar styloid will reduce to 34 cm. or less.    Time  8    Period  Weeks    Status  New      Additional Long Term Goals   Additional Long Term Goals  Yes      PT LONG TERM GOAL #6   Title  Patient's right arm circumference at 10 cm. proximal to olecranon will reduce to 40 cm. or less.    Time  8    Period  Weeks    Status  New            Plan - 09/03/17 1701    Clinical Impression Statement  Pt was measured for compression garments, both day and night time prior to her appointment. Continued with MLD and bandaging to keep swelling down until she recieves her compression garments. Circumferential measurements were taken today and demonstrate an overall decrease of approximately 2 cm throughout her UE.     Rehab Potential  Good    PT Frequency  3x / week    PT Duration  8 weeks    PT Treatment/Interventions  ADLs/Self Care Home Management;DME Instruction;Therapeutic exercise;Patient/family education;Orthotic Fit/Training;Manual techniques;Manual lymph drainage;Compression bandaging;Passive range of motion;Taping  PT Next Visit Plan   Continue complete decongestive therapy;     PT Home Exercise Plan  okay to do her workout at [employer] (treadmill, bike, 30-minute circuit room) with bandages on    Consulted and Agree with Plan of Care  Patient       Patient will benefit from skilled therapeutic intervention in order to improve the following  deficits and impairments:  Increased edema, Decreased knowledge of use of DME  Visit Diagnosis: Postmastectomy lymphedema     Problem List Patient Active Problem List   Diagnosis Date Noted  . Psoriasis 08/23/2013  . Chronic acquired lymphedema 08/21/2013  . Breast cancer, right breast ([place]) 12/26/2011  . Hypertension   . Carcinoma in situ of cervix   . Ovarian cyst   . Cancer ([place])   . Pituitary microadenoma [facility])     Allyson Sabal [facility] 09/03/2017, 5:03 PM  [place], [place], [address] Phone: [phone]   Fax:  [phone]  Name: [name] Gibson MRN: [id number] Date of Birth: Nov 15, 1955  Manus Gunning, PT 09/03/17 5:03 PM

## 2017-09-05 ENCOUNTER — Ambulatory Visit: Payer: 59 | Admitting: Physical Therapy

## 2017-09-05 DIAGNOSIS — M25511 Pain in right shoulder: Secondary | ICD-10-CM | POA: Diagnosis not present

## 2017-09-05 DIAGNOSIS — I972 Postmastectomy lymphedema syndrome: Secondary | ICD-10-CM

## 2017-09-05 DIAGNOSIS — G8929 Other chronic pain: Secondary | ICD-10-CM | POA: Diagnosis not present

## 2017-09-05 MED FILL — ZOLPIDEM TARTRATE 10 MG TAB: 10 | 90 days supply | Qty: 90 | Fill #0

## 2017-09-05 NOTE — Therapy (Signed)
[place], [place], [address] Phone: [phone]   Fax:  [phone]  Physical Therapy Treatment  Patient Details  Name: [name] Gibson MRN: [id number] Date of Birth: 07-Apr-1956 Referring Provider: Thedore Mins   Encounter Date: 09/05/2017  PT End of Session - 09/05/17 1709    Visit Number  18    Number of Visits  25    Date for PT Re-Evaluation  10/10/17    PT Start Time  1603    PT Stop Time  1700    PT Time Calculation (min)  57 min    Activity Tolerance  Patient tolerated treatment well    Behavior During Therapy  [facility] for tasks assessed/performed       Past Medical History:  Diagnosis Date  . Cellulitis of arm, right 08/21/2013  . Complication of anesthesia    some nausea after second mastectomy surgery  . Hypertension   . Ovarian cyst   . Pituitary microadenoma ([place])   . Psoriasis     Past Surgical History:  Procedure Laterality Date  . Anal Fistula Repair    . BREAST SURGERY     Right Mastectomy  . CERVICAL CONE BIOPSY    . CHOLECYSTECTOMY    . COLPOSCOPY    . TONSILLECTOMY      There were no vitals filed for this visit.  Subjective Assessment - 09/05/17 1611    Subjective  This is my hard spot (just proximal to wrist on dorsal surface).  Can we get that down a little?    Pertinent History  Right breast diagnosed in May 2009. Had mastectomy, chemo, and radiation; is on tamoxifen for 9 years.  HTN controlled with meds.  Lymphedema started about one year out from treatment; she has been treated here on more than one occasion in the past.    Currently in Pain?  No/denies                      [facility] Adult PT Treatment/Exercise - 09/05/17 0001      Manual Therapy   Edema Management  removed bandages and checked skin, which looks good.     Manual Lymphatic Drainage (MLD)  performed by Shan Levans, PT observed by this PT: short neck, superficial and deep abdominals,  left axillary nodes and establishment of interaxillary pathway, right inguinal nodes and establishment of axillo inguinal pathway, R UE working proximal to distal (to fingers) then retracing all steps    Compression Bandaging  performed by Shan Levans, PT with observation by this therapist: Lotion applied then TG soft from hand to axilla, 1/2" gray foam with hole cut out for thumb applied to palmer surface from thumb to just above wrist  Elastomull x 2  to all fingers with third piece around MCP joints and hand artiflex x2 from hand to axilla, then 1-6 cm. bandage, 2-10 cm bandage, 3-12 cm bandage, with two of the last three 12 cm. bandages in herringbone (one on forearm, one on upper arm), overall from hand to axilla               PT Short Term Goals - 08/10/17 0951      PT SHORT TERM GOAL #1   Title  Patient's right arm circumference at 10 cm. proximal to olecranon will reduce to 48 cm.    Baseline  51.1 cm. on eval compared to 37.7 on the left    Status  Achieved  PT SHORT TERM GOAL #2   Title  Pt.'s right arm circumference at 15 cm. proximal to ulnar styloid will reduceto 42 cm.    Baseline  45.2 cm. right compared to 29.2 on left    Status  Achieved        PT Long Term Goals - 08/20/17 1509      PT LONG TERM GOAL #1   Title  Patient's right arm circumference at 10 cm. proximal to olecranon will reduce to 45 cm. or less.    Baseline  51.1 cm. compared to 37.7 on left at eval    Time  8    Period  Weeks    Status  Achieved      PT LONG TERM GOAL #2   Title  Patient's right arm circumference at 15 cm. proximal to ulnar styloid will reduce to 39 cm. or less.    Baseline  45.2 cm. on right compared to 29.2 on left at eval.    Time  8    Period  Weeks      PT LONG TERM GOAL #3   Title  Pt. will have new custom fit compression sleeve and glove.    Time  8    Period  Weeks    Status  On-going      PT LONG TERM GOAL #4   Title  Pt. will have a plan in place for  lymphedema self-management    Time  8    Period  Weeks    Status  On-going      PT LONG TERM GOAL #5   Title  Patient's right arm circumference at 15 cm. proximal to ulnar styloid will reduce to 34 cm. or less.    Time  8    Period  Weeks    Status  New      Additional Long Term Goals   Additional Long Term Goals  Yes      PT LONG TERM GOAL #6   Title  Patient's right arm circumference at 10 cm. proximal to olecranon will reduce to 40 cm. or less.    Time  8    Period  Weeks    Status  New            Plan - 09/05/17 1710    Clinical Impression Statement  Now continuing complete decongestive therapy until her garments arrive.  Pt. points out an extra fullness at forearm, and therapist and patient both felt this softened some with extra work with manual lymph drainage.     Rehab Potential  Good    PT Frequency  3x / week    PT Duration  8 weeks    PT Treatment/Interventions  ADLs/Self Care Home Management;DME Instruction;Therapeutic exercise;Patient/family education;Orthotic Fit/Training;Manual techniques;Manual lymph drainage;Compression bandaging;Passive range of motion;Taping    PT Next Visit Plan   Continue complete decongestive therapy;     PT Home Exercise Plan  okay to do her workout at [employer] (treadmill, bike, 30-minute circuit room) with bandages on    Consulted and Agree with Plan of Care  Patient       Patient will benefit from skilled therapeutic intervention in order to improve the following deficits and impairments:  Increased edema, Decreased knowledge of use of DME  Visit Diagnosis: Postmastectomy lymphedema     Problem List Patient Active Problem List   Diagnosis Date Noted  . Psoriasis 08/23/2013  . Chronic acquired lymphedema 08/21/2013  . Breast cancer, right breast (  [place]) 12/26/2011  . Hypertension   . Carcinoma in situ of cervix   . Ovarian cyst   . Cancer ([place])   . Pituitary microadenoma ([place])     [place] 09/05/2017, 5:12  PM  [place], [place], [address] Phone: [phone]   Fax:  [phone]  Name: NANCEY KREITZ MRN: [id number] Date of Birth: 07-07-1956  Serafina Royals, PT 09/05/17 5:12 PM

## 2017-09-06 DIAGNOSIS — H5203 Hypermetropia, bilateral: Secondary | ICD-10-CM | POA: Diagnosis not present

## 2017-09-07 ENCOUNTER — Ambulatory Visit: Payer: 59 | Admitting: Physical Therapy

## 2017-09-07 ENCOUNTER — Encounter: Payer: Self-pay | Admitting: Physical Therapy

## 2017-09-07 DIAGNOSIS — I972 Postmastectomy lymphedema syndrome: Secondary | ICD-10-CM | POA: Diagnosis not present

## 2017-09-07 DIAGNOSIS — M25511 Pain in right shoulder: Secondary | ICD-10-CM | POA: Diagnosis not present

## 2017-09-07 DIAGNOSIS — G8929 Other chronic pain: Secondary | ICD-10-CM | POA: Diagnosis not present

## 2017-09-07 NOTE — Therapy (Signed)
[place], [place], [address] Phone: [phone]   Fax:  [phone]  Physical Therapy Treatment  Patient Details  Name: [name] Gibson MRN: [id number] Date of Birth: Feb 21, 1956 Referring Provider: Thedore Mins   Encounter Date: 09/07/2017  PT End of Session - 09/07/17 0937    Visit Number  19    Number of Visits  25    Date for PT Re-Evaluation  10/10/17    PT Start Time  0802    PT Stop Time  0846    PT Time Calculation (min)  44 min    Activity Tolerance  Patient tolerated treatment well    Behavior During Therapy  [facility] for tasks assessed/performed       Past Medical History:  Diagnosis Date  . Cellulitis of arm, right 08/21/2013  . Complication of anesthesia    some nausea after second mastectomy surgery  . Hypertension   . Ovarian cyst   . Pituitary microadenoma ([place])   . Psoriasis     Past Surgical History:  Procedure Laterality Date  . Anal Fistula Repair    . BREAST SURGERY     Right Mastectomy  . CERVICAL CONE BIOPSY    . CHOLECYSTECTOMY    . COLPOSCOPY    . TONSILLECTOMY      There were no vitals filed for this visit.  Subjective Assessment - 09/07/17 0804    Subjective  One of my finger bandages came loose and I had to re wrap it around my finger.     Pertinent History  Right breast diagnosed in May 2009. Had mastectomy, chemo, and radiation; is on tamoxifen for 9 years.  HTN controlled with meds.  Lymphedema started about one year out from treatment; she has been treated here on more than one occasion in the past.    Patient Stated Goals  Reduce arm swelling    Currently in Pain?  No/denies    Pain Score  0-No pain                      [facility] Adult PT Treatment/Exercise - 09/07/17 0001      Manual Therapy   Edema Management  removed bandages and checked skin, which looks good.     Manual Lymphatic Drainage (MLD)  short neck, superficial and deep  abdominals, left axillary nodes and establishment of interaxillary pathway, right inguinal nodes and establishment of axillo inguinal pathway, R UE working proximal to distal (to fingers) then retracing all steps    Compression Bandaging  Lotion applied then TG soft from hand to axilla, thin foam with hole cut out for thumb applied to palmer surface from thumb to just above wrist  Elastomull x 2  to all fingers with third piece around MCP joints and hand artiflex x2 from hand to axilla, then 1-6 cm. bandage, 2-10 cm bandage, 3-12 cm bandage, with two of the last three 12 cm. bandages in herringbone (one on forearm, one on upper arm), overall from hand to axilla               PT Short Term Goals - 08/10/17 0951      PT SHORT TERM GOAL #1   Title  Patient's right arm circumference at 10 cm. proximal to olecranon will reduce to 48 cm.    Baseline  51.1 cm. on eval compared to 37.7 on the left    Status  Achieved  PT SHORT TERM GOAL #2   Title  Pt.'s right arm circumference at 15 cm. proximal to ulnar styloid will reduceto 42 cm.    Baseline  45.2 cm. right compared to 29.2 on left    Status  Achieved        PT Long Term Goals - 08/20/17 1509      PT LONG TERM GOAL #1   Title  Patient's right arm circumference at 10 cm. proximal to olecranon will reduce to 45 cm. or less.    Baseline  51.1 cm. compared to 37.7 on left at eval    Time  8    Period  Weeks    Status  Achieved      PT LONG TERM GOAL #2   Title  Patient's right arm circumference at 15 cm. proximal to ulnar styloid will reduce to 39 cm. or less.    Baseline  45.2 cm. on right compared to 29.2 on left at eval.    Time  8    Period  Weeks      PT LONG TERM GOAL #3   Title  Pt. will have new custom fit compression sleeve and glove.    Time  8    Period  Weeks    Status  On-going      PT LONG TERM GOAL #4   Title  Pt. will have a plan in place for lymphedema self-management    Time  8    Period  Weeks     Status  On-going      PT LONG TERM GOAL #5   Title  Patient's right arm circumference at 15 cm. proximal to ulnar styloid will reduce to 34 cm. or less.    Time  8    Period  Weeks    Status  New      Additional Long Term Goals   Additional Long Term Goals  Yes      PT LONG TERM GOAL #6   Title  Patient's right arm circumference at 10 cm. proximal to olecranon will reduce to 40 cm. or less.    Time  8    Period  Weeks    Status  New            Plan - 09/07/17 3825    Clinical Impression Statement  Continued complete decongestive therapy while pt awaits arrival of her garments. Pt continues to have increased fullness at forearm so spent extra time during MLD on this area to help soften and decrease swelling.     Rehab Potential  Good    PT Frequency  3x / week    PT Duration  8 weeks    PT Treatment/Interventions  ADLs/Self Care Home Management;DME Instruction;Therapeutic exercise;Patient/family education;Orthotic Fit/Training;Manual techniques;Manual lymph drainage;Compression bandaging;Passive range of motion;Taping    PT Next Visit Plan   Continue complete decongestive therapy;     PT Home Exercise Plan  okay to do her workout at [employer] (treadmill, bike, 30-minute circuit room) with bandages on    Consulted and Agree with Plan of Care  Patient       Patient will benefit from skilled therapeutic intervention in order to improve the following deficits and impairments:  Increased edema, Decreased knowledge of use of DME  Visit Diagnosis: Postmastectomy lymphedema     Problem List Patient Active Problem List   Diagnosis Date Noted  . Psoriasis 08/23/2013  . Chronic acquired lymphedema 08/21/2013  . Breast cancer, right breast (  [place]) 12/26/2011  . Hypertension   . Carcinoma in situ of cervix   . Ovarian cyst   . Cancer ([place])   . Pituitary microadenoma [facility])     Allyson Sabal [facility] 09/07/2017, 9:38 AM  [place], [place], [address] Phone: [phone]   Fax:  [phone]  Name: [name] Gibson MRN: [id number] Date of Birth: 21-Apr-1956  Manus Gunning, PT 09/07/17 9:38 AM

## 2017-09-10 ENCOUNTER — Ambulatory Visit: Payer: 59 | Admitting: Physical Therapy

## 2017-09-10 DIAGNOSIS — I972 Postmastectomy lymphedema syndrome: Secondary | ICD-10-CM

## 2017-09-10 DIAGNOSIS — M25511 Pain in right shoulder: Secondary | ICD-10-CM | POA: Diagnosis not present

## 2017-09-10 DIAGNOSIS — G8929 Other chronic pain: Secondary | ICD-10-CM | POA: Diagnosis not present

## 2017-09-10 NOTE — Therapy (Signed)
[place], [place], [address] Phone: [phone]   Fax:  [phone]  Physical Therapy Treatment  Patient Details  Name: [name] Gibson MRN: [id number] Date of Birth: [birth year] Referring Provider: Thedore Mins   Encounter Date: 09/10/2017  PT End of Session - 09/10/17 1711    Visit Number  20    Number of Visits  25    Date for PT Re-Evaluation  10/10/17    PT Start Time  9937    PT Stop Time  1705    PT Time Calculation (min)  60 min    Activity Tolerance  Patient tolerated treatment well    Behavior During Therapy  [facility] for tasks assessed/performed       Past Medical History:  Diagnosis Date  . Cellulitis of arm, right 08/21/2013  . Complication of anesthesia    some nausea after second mastectomy surgery  . Hypertension   . Ovarian cyst   . Pituitary microadenoma ([place])   . Psoriasis     Past Surgical History:  Procedure Laterality Date  . Anal Fistula Repair    . BREAST SURGERY     Right Mastectomy  . CERVICAL CONE BIOPSY    . CHOLECYSTECTOMY    . COLPOSCOPY    . TONSILLECTOMY      There were no vitals filed for this visit.         LYMPHEDEMA/ONCOLOGY QUESTIONNAIRE - 09/10/17 1613      Right Upper Extremity Lymphedema   15 cm Proximal to Olecranon Process  42.3 cm    10 cm Proximal to Olecranon Process  41 cm    Olecranon Process  35.3 cm    15 cm Proximal to Ulnar Styloid Process  34.8 cm    10 cm Proximal to Ulnar Styloid Process  33.2 cm    Just Proximal to Ulnar Styloid Process  29.2 cm    Across Hand at [employer]  22.6 cm    At [place] of 2nd Digit  7.6 cm               [facility] Adult PT Treatment/Exercise - 09/10/17 0001      Manual Therapy   Edema Management  removed bandages and checked skin, which looks good. Circumference measurements taken.    Manual Lymphatic Drainage (MLD)  performed by Shan Levans, PT observed by this PT: short neck,  superficial and deep abdominals, left axillary nodes and establishment of interaxillary pathway, right inguinal nodes and establishment of axillo inguinal pathway, R UE working proximal to distal (to fingers) then retracing all steps    Compression Bandaging  Lotion applied then TG soft from hand to axilla, thin foam with hole cut out for thumb applied to palmer surface from thumb to just above wrist  Elastomull x 2  to all fingers with third piece around MCP joints and hand artiflex x2 from hand to axilla, then 1-6 cm. bandage, 2-10 cm bandage, 3-12 cm bandage, with two of the last three 12 cm. bandages in herringbone (one on forearm, one on upper arm), overall from hand to axilla               PT Short Term Goals - 08/10/17 0951      PT SHORT TERM GOAL #1   Title  Patient's right arm circumference at 10 cm. proximal to olecranon will reduce to 48 cm.    Baseline  51.1 cm. on eval compared to 37.7 on  the left    Status  Achieved      PT SHORT TERM GOAL #2   Title  Pt.'s right arm circumference at 15 cm. proximal to ulnar styloid will reduceto 42 cm.    Baseline  45.2 cm. right compared to 29.2 on left    Status  Achieved        PT Long Term Goals - 08/20/17 1509      PT LONG TERM GOAL #1   Title  Patient's right arm circumference at 10 cm. proximal to olecranon will reduce to 45 cm. or less.    Baseline  51.1 cm. compared to 37.7 on left at eval    Time  8    Period  Weeks    Status  Achieved      PT LONG TERM GOAL #2   Title  Patient's right arm circumference at 15 cm. proximal to ulnar styloid will reduce to 39 cm. or less.    Baseline  45.2 cm. on right compared to 29.2 on left at eval.    Time  8    Period  Weeks      PT LONG TERM GOAL #3   Title  Pt. will have new custom fit compression sleeve and glove.    Time  8    Period  Weeks    Status  On-going      PT LONG TERM GOAL #4   Title  Pt. will have a plan in place for lymphedema self-management    Time  8     Period  Weeks    Status  On-going      PT LONG TERM GOAL #5   Title  Patient's right arm circumference at 15 cm. proximal to ulnar styloid will reduce to 34 cm. or less.    Time  8    Period  Weeks    Status  New      Additional Long Term Goals   Additional Long Term Goals  Yes      PT LONG TERM GOAL #6   Title  Patient's right arm circumference at 10 cm. proximal to olecranon will reduce to 40 cm. or less.    Time  8    Period  Weeks    Status  New            Plan - 09/10/17 1711    Clinical Impression Statement  Pt.'s forearm was visibly larger today, and measurements confirmed that; not sure why that was the case, but gray foam slipped out of the bandage over the weekend.    Rehab Potential  Good    PT Frequency  3x / week    PT Duration  8 weeks    PT Treatment/Interventions  ADLs/Self Care Home Management;DME Instruction;Therapeutic exercise;Patient/family education;Orthotic Fit/Training;Manual techniques;Manual lymph drainage;Compression bandaging;Passive range of motion;Taping    PT Next Visit Plan   Continue complete decongestive therapy;     PT Home Exercise Plan  okay to do her workout at [employer] (treadmill, bike, 30-minute circuit room) with bandages on    Consulted and Agree with Plan of Care  Patient       Patient will benefit from skilled therapeutic intervention in order to improve the following deficits and impairments:  Increased edema, Decreased knowledge of use of DME  Visit Diagnosis: Postmastectomy lymphedema     Problem List Patient Active Problem List   Diagnosis Date Noted  . Psoriasis 08/23/2013  . Chronic acquired lymphedema 08/21/2013  .  Breast cancer, right breast ([place]) 12/26/2011  . Hypertension   . Carcinoma in situ of cervix   . Ovarian cyst   . Cancer ([place])   . Pituitary microadenoma ([place])     [place] 09/10/2017, 5:14 PM  [place], [place], [address] Phone: [phone]   Fax:  [phone]  Name: ENNA WARWICK MRN: [id number] Date of Birth: 1956-04-28  Serafina Royals, PT 09/10/17 5:14 PM

## 2017-09-12 ENCOUNTER — Ambulatory Visit: Payer: 59 | Admitting: Physical Therapy

## 2017-09-12 DIAGNOSIS — G8929 Other chronic pain: Secondary | ICD-10-CM | POA: Diagnosis not present

## 2017-09-12 DIAGNOSIS — I972 Postmastectomy lymphedema syndrome: Secondary | ICD-10-CM | POA: Diagnosis not present

## 2017-09-12 DIAGNOSIS — M25511 Pain in right shoulder: Secondary | ICD-10-CM | POA: Diagnosis not present

## 2017-09-12 NOTE — Therapy (Signed)
[facility] [address] [place], [place], [address] Phone: [phone]   Fax:  [phone]  Physical Therapy Treatment  Patient Details  Name: [name] Gibson MRN: [id number] Date of Birth: 08/18/1955 Referring Provider: Lindsay Causey   Encounter Date: 09/12/2017  PT End of Session - 09/12/17 1713    Visit Number  21    Number of Visits  25    Date for PT Re-Evaluation  10/10/17    PT Start Time  1602    PT Stop Time  1704    PT Time Calculation (min)  62 min    Activity Tolerance  Patient tolerated treatment well    Behavior During Therapy  WFL for tasks assessed/performed       Past Medical History:  Diagnosis Date  . Cellulitis of arm, right 08/21/2013  . Complication of anesthesia    some nausea after second mastectomy surgery  . Hypertension   . Ovarian cyst   . Pituitary microadenoma ([facility])   . Psoriasis     Past Surgical History:  Procedure Laterality Date  . Anal Fistula Repair    . BREAST SURGERY     Right Mastectomy  . CERVICAL CONE BIOPSY    . CHOLECYSTECTOMY    . COLPOSCOPY    . TONSILLECTOMY      There were no vitals filed for this visit.  Subjective Assessment - 09/12/17 1604    Subjective  Tolerated the bandaging okay, even with snug forearm wrapping.  I think it did some good.    Pertinent History  Right breast diagnosed in May 2009. Had mastectomy, chemo, and radiation; is on tamoxifen for 9 years.  HTN controlled with meds.  Lymphedema started about one year out from treatment; she has been treated here on more than one occasion in the past.    Currently in Pain?  Yes    Pain Score  2     Pain Location  Finger (Comment which one) thumb; some in finger joints too    Pain Orientation  Right    Pain Descriptors / Indicators  Other (Comment) stiff    Aggravating Factors   bandaging            LYMPHEDEMA/ONCOLOGY QUESTIONNAIRE - 09/12/17 1607      Right Upper Extremity Lymphedema    Just Proximal to Ulnar Styloid Process  26.5 cm    Across Hand at Thumb Web Space  22.7 cm               [facility] Adult PT Treatment/Exercise - 09/12/17 0001      Manual Therapy   Edema Management  removed bandages and checked skin, which looks good. a couple of circumference measurements taken.    Manual Lymphatic Drainage (MLD)  short neck, superficial and deep abdominals, left axillary nodes and establishment of interaxillary pathway, right inguinal nodes and establishment of axillo inguinal pathway, R UE working proximal to distal (to fingers) then retracing all steps    Compression Bandaging  Lotion applied then TG soft from hand to axilla, small green Komprex foam pad at dorsal hand, Elastomull x 2  to all fingers with third piece around MCP joints and hand artiflex x2 from hand to axilla, then 1-6 cm. bandage, 2-10 cm bandage, 3-12 cm bandage, with two of the last three 12 cm. bandages in herringbone (one on forearm, one on upper arm), overall from hand to axilla                 PT Short Term Goals - 08/10/17 0951      PT SHORT TERM GOAL #1   Title  Patient's right arm circumference at 10 cm. proximal to olecranon will reduce to 48 cm.    Baseline  51.1 cm. on eval compared to 37.7 on the left    Status  Achieved      PT SHORT TERM GOAL #2   Title  Pt.'s right arm circumference at 15 cm. proximal to ulnar styloid will reduceto 42 cm.    Baseline  45.2 cm. right compared to 29.2 on left    Status  Achieved        PT Long Term Goals - 09/12/17 1716      PT LONG TERM GOAL #3   Title  Pt. will have new custom fit compression sleeve and glove.    Status  Partially Met      PT LONG TERM GOAL #4   Title  Pt. will have a plan in place for lymphedema self-management    Status  Partially Met            Plan - 09/12/17 1713    Clinical Impression Statement  Pt.'s forearm had increased in size prior to the last session, so bandages had been applied more snugly to  that area last time.  Today, circumference measurements had reduced part of the way back down (she had gone from 24 to 29.2 cm. on 2/18, then back down to 26.5 cm. today). Changed bandaging with leaving off the foam pad at thumb/wrist area, and used Komprex foam at dorsal hand to try to reduce that.    Rehab Potential  Good    PT Frequency  3x / week    PT Duration  8 weeks    PT Treatment/Interventions  ADLs/Self Care Home Management;DME Instruction;Therapeutic exercise;Patient/family education;Orthotic Fit/Training;Manual techniques;Manual lymph drainage;Compression bandaging;Passive range of motion;Taping    PT Next Visit Plan   Continue complete decongestive therapy, with focus on keeping forearm wrap quite snug, since that at increased in size recently    PT Home Exercise Plan  okay to do her workout at [employer] (treadmill, bike, 30-minute circuit room) with bandages on    Recommended Other Services  check with Dawson Bills the week of 2/25 about whether garments will be received next week    Consulted and Agree with Plan of Care  Patient       Patient will benefit from skilled therapeutic intervention in order to improve the following deficits and impairments:  Increased edema, Decreased knowledge of use of DME  Visit Diagnosis: Postmastectomy lymphedema     Problem List Patient Active Problem List   Diagnosis Date Noted  . Psoriasis 08/23/2013  . Chronic acquired lymphedema 08/21/2013  . Breast cancer, right breast ([place]) 12/26/2011  . Hypertension   . Carcinoma in situ of cervix   . Ovarian cyst   . Cancer ([place])   . Pituitary microadenoma ([place])     [place] 09/12/2017, 5:17 PM  [place], [place], [address] Phone: [phone]   Fax:  [phone]  Name: [name] Gibson MRN: [id number] Date of Birth: Dec 07, 1955  Serafina Royals, PT 09/12/17 5:17 PM

## 2017-09-13 MED FILL — TAMOXIFEN CITRATE 20 MG TAB: 20 | 90 days supply | Qty: 90 | Fill #3

## 2017-09-14 ENCOUNTER — Encounter: Payer: Self-pay | Admitting: Physical Therapy

## 2017-09-14 ENCOUNTER — Ambulatory Visit: Payer: 59 | Admitting: Physical Therapy

## 2017-09-14 DIAGNOSIS — I972 Postmastectomy lymphedema syndrome: Secondary | ICD-10-CM | POA: Diagnosis not present

## 2017-09-14 DIAGNOSIS — M25511 Pain in right shoulder: Secondary | ICD-10-CM | POA: Diagnosis not present

## 2017-09-14 DIAGNOSIS — G8929 Other chronic pain: Secondary | ICD-10-CM | POA: Diagnosis not present

## 2017-09-14 NOTE — Therapy (Signed)
[place], [place], [address] Phone: [phone]   Fax:  [phone]  Physical Therapy Treatment  Patient Details  Name: [name] Gibson MRN: [id number] Date of Birth: July 15, 1956 Referring Provider: Thedore Mins   Encounter Date: 09/14/2017  PT End of Session - 09/14/17 0849    Visit Number  22    Number of Visits  25    Date for PT Re-Evaluation  10/10/17    PT Start Time  0805    PT Stop Time  0849    PT Time Calculation (min)  44 min    Activity Tolerance  Patient tolerated treatment well    Behavior During Therapy  [facility] for tasks assessed/performed       Past Medical History:  Diagnosis Date  . Cellulitis of arm, right 08/21/2013  . Complication of anesthesia    some nausea after second mastectomy surgery  . Hypertension   . Ovarian cyst   . Pituitary microadenoma ([place])   . Psoriasis     Past Surgical History:  Procedure Laterality Date  . Anal Fistula Repair    . BREAST SURGERY     Right Mastectomy  . CERVICAL CONE BIOPSY    . CHOLECYSTECTOMY    . COLPOSCOPY    . TONSILLECTOMY      There were no vitals filed for this visit.  Subjective Assessment - 09/14/17 0806    Subjective  I took off the top bandage and my thumb came out last night.     Pertinent History  Right breast diagnosed in May 2009. Had mastectomy, chemo, and radiation; is on tamoxifen for 9 years.  HTN controlled with meds.  Lymphedema started about one year out from treatment; she has been treated here on more than one occasion in the past.    Patient Stated Goals  Reduce arm swelling    Currently in Pain?  No/denies    Pain Score  0-No pain            LYMPHEDEMA/ONCOLOGY QUESTIONNAIRE - 09/14/17 0811      Right Upper Extremity Lymphedema   Just Proximal to Ulnar Styloid Process  25 cm    Across Hand at [employer] Space  22.6 cm               [facility] Adult PT Treatment/Exercise - 09/14/17 0001      Manual Therapy   Edema Management  removed bandages and checked skin, which looks good. a couple of circumference measurements taken.    Manual Lymphatic Drainage (MLD)  short neck, superficial and deep abdominals, left axillary nodes and establishment of interaxillary pathway, right inguinal nodes and establishment of axillo inguinal pathway, R UE working proximal to distal (to fingers) then retracing all steps    Compression Bandaging  Lotion applied then TG soft from hand to axilla, small green Komprex foam pad at dorsal hand, Elastomull x 2  to all fingers with third piece around MCP joints and hand artiflex x2 from hand to axilla, then 1-6 cm. bandage, 2-10 cm bandage, 3-12 cm bandage, with two of the last three 12 cm. bandages in herringbone (one on forearm, one on upper arm), overall from hand to axilla               PT Short Term Goals - 08/10/17 0951      PT SHORT TERM GOAL #1   Title  Patient's right arm circumference at 10 cm. proximal to olecranon will  reduce to 48 cm.    Baseline  51.1 cm. on eval compared to 37.7 on the left    Status  Achieved      PT SHORT TERM GOAL #2   Title  Pt.'s right arm circumference at 15 cm. proximal to ulnar styloid will reduceto 42 cm.    Baseline  45.2 cm. right compared to 29.2 on left    Status  Achieved        PT Long Term Goals - 09/12/17 1716      PT LONG TERM GOAL #3   Title  Pt. will have new custom fit compression sleeve and glove.    Status  Partially Met      PT LONG TERM GOAL #4   Title  Pt. will have a plan in place for lymphedema self-management    Status  Partially Met            Plan - 09/14/17 0850    Clinical Impression Statement  Pt's forearm decreased another 1.5 cm today and is only 1 cm away from it lowest point. Applied bandages more snug again today and did not use the foam at the wrist to maintain reduction. Will measure again at next session.     Rehab Potential  Good    PT Frequency  3x / week     PT Duration  8 weeks    PT Treatment/Interventions  ADLs/Self Care Home Management;DME Instruction;Therapeutic exercise;Patient/family education;Orthotic Fit/Training;Manual techniques;Manual lymph drainage;Compression bandaging;Passive range of motion;Taping    PT Next Visit Plan   remeasure at wrist, Continue complete decongestive therapy, with focus on keeping forearm wrap quite snug, since that at increased in size recently    PT Home Exercise Plan  okay to do her workout at [employer] (treadmill, bike, 30-minute circuit room) with bandages on    Consulted and Agree with Plan of Care  Patient       Patient will benefit from skilled therapeutic intervention in order to improve the following deficits and impairments:  Increased edema, Decreased knowledge of use of DME  Visit Diagnosis: Postmastectomy lymphedema     Problem List Patient Active Problem List   Diagnosis Date Noted  . Psoriasis 08/23/2013  . Chronic acquired lymphedema 08/21/2013  . Breast cancer, right breast ([place]) 12/26/2011  . Hypertension   . Carcinoma in situ of cervix   . Ovarian cyst   . Cancer ([place])   . Pituitary microadenoma [facility])     Allyson Sabal [facility] 09/14/2017, 8:51 AM  [place], [place], [address] Phone: [phone]   Fax:  [phone]  Name: STEPHENIE NAVEJAS MRN: [id number] Date of Birth: 04-Sep-1955  Manus Gunning, PT 09/14/17 8:51 AM

## 2017-09-17 ENCOUNTER — Ambulatory Visit: Payer: 59 | Admitting: Physical Therapy

## 2017-09-17 DIAGNOSIS — M25511 Pain in right shoulder: Secondary | ICD-10-CM | POA: Diagnosis not present

## 2017-09-17 DIAGNOSIS — I972 Postmastectomy lymphedema syndrome: Secondary | ICD-10-CM | POA: Diagnosis not present

## 2017-09-17 DIAGNOSIS — G8929 Other chronic pain: Secondary | ICD-10-CM | POA: Diagnosis not present

## 2017-09-17 NOTE — Therapy (Signed)
[place], [place], [address] Phone: [phone]   Fax:  [phone]  Physical Therapy Treatment  Patient Details  Name: [name] Gibson MRN: [id number] Date of Birth: 10-09-1955 Referring Provider: Thedore Mins   Encounter Date: 09/17/2017  PT End of Session - 09/17/17 1726    Visit Number  23    Number of Visits  25    Date for PT Re-Evaluation  10/10/17    PT Start Time  1602    PT Stop Time  1706    PT Time Calculation (min)  64 min    Activity Tolerance  Patient tolerated treatment well    Behavior During Therapy  [facility] for tasks assessed/performed       Past Medical History:  Diagnosis Date  . Cellulitis of arm, right 08/21/2013  . Complication of anesthesia    some nausea after second mastectomy surgery  . Hypertension   . Ovarian cyst   . Pituitary microadenoma ([place])   . Psoriasis     Past Surgical History:  Procedure Laterality Date  . Anal Fistula Repair    . BREAST SURGERY     Right Mastectomy  . CERVICAL CONE BIOPSY    . CHOLECYSTECTOMY    . COLPOSCOPY    . TONSILLECTOMY      There were no vitals filed for this visit.  Subjective Assessment - 09/17/17 1604    Subjective  Nothing new.  Kept the bandages on all weekend.    Pertinent History  Right breast cancer diagnosed in May 2009. Had mastectomy, chemo, and radiation; is on tamoxifen for 9 years.  HTN controlled with meds.  Lymphedema started about one year out from treatment; she has been treated here on more than one occasion in the past.    Patient Stated Goals  Reduce arm swelling    Currently in Pain?  No/denies            LYMPHEDEMA/ONCOLOGY QUESTIONNAIRE - 09/17/17 1610      Right Upper Extremity Lymphedema   15 cm Proximal to Olecranon Process  41.1 cm    10 cm Proximal to Olecranon Process  41.1 cm    Olecranon Process  34.5 cm    15 cm Proximal to Ulnar Styloid Process  35.1 cm    10 cm Proximal to  Ulnar Styloid Process  33.6 cm    Just Proximal to Ulnar Styloid Process  27.3 cm    Across Hand at [employer]  22.5 cm    At [place] of 2nd Digit  7.4 cm               [facility] Adult PT Treatment/Exercise - 09/17/17 0001      Manual Therapy   Edema Management  removed bandages and checked skin, which looks good. Circumference measurements taken.    Manual Lymphatic Drainage (MLD)  performed by Shan Levans, PT observed by this PT: short neck, superficial and deep abdominals, left axillary nodes and establishment of interaxillary pathway, right inguinal nodes and establishment of axillo inguinal pathway, R UE working proximal to distal (to fingers) then retracing all steps    Compression Bandaging  Lotion applied then TG soft from hand to axilla, thin foam with hole cut out for thumb applied to palmer surface from thumb to just above wrist  Elastomull x 2  to all fingers with third piece around MCP joints and hand artiflex x2 from hand to axilla, then 1-6 cm. bandage,  2-10 cm bandage, 3-12 cm bandage, with two of the last three 12 cm. bandages in herringbone (one on forearm, one on upper arm), overall from hand to axilla               PT Short Term Goals - 08/10/17 0951      PT SHORT TERM GOAL #1   Title  Patient's right arm circumference at 10 cm. proximal to olecranon will reduce to 48 cm.    Baseline  51.1 cm. on eval compared to 37.7 on the left    Status  Achieved      PT SHORT TERM GOAL #2   Title  Pt.'s right arm circumference at 15 cm. proximal to ulnar styloid will reduceto 42 cm.    Baseline  45.2 cm. right compared to 29.2 on left    Status  Achieved        PT Long Term Goals - 09/12/17 1716      PT LONG TERM GOAL #3   Title  Pt. will have new custom fit compression sleeve and glove.    Status  Partially Met      PT LONG TERM GOAL #4   Title  Pt. will have a plan in place for lymphedema self-management    Status  Partially Met            Plan -  09/17/17 1727    Clinical Impression Statement  Pt.'s measurements today were slightly increased in spots, and decreased in other spots, such as at upper arm. Tissue continues to feel soft. We are now awaiting her new daytime compression garments.    Rehab Potential  Good    PT Frequency  3x / week    PT Duration  8 weeks    PT Treatment/Interventions  ADLs/Self Care Home Management;DME Instruction;Therapeutic exercise;Patient/family education;Orthotic Fit/Training;Manual techniques;Manual lymph drainage;Compression bandaging;Passive range of motion;Taping    PT Next Visit Plan  Continue complete decongestive therapy, with focus on keeping forearm wrap quite snug, since that at increased in size recently    PT Home Exercise Plan  okay to do her workout at [employer] (treadmill, bike, 30-minute circuit room) with bandages on    Recommended Other Services  Leone Payor, PT, emailed [facility] Bills about this patient's garment status    Consulted and Agree with Plan of Care  Patient       Patient will benefit from skilled therapeutic intervention in order to improve the following deficits and impairments:  Increased edema, Decreased knowledge of use of DME  Visit Diagnosis: Postmastectomy lymphedema     Problem List Patient Active Problem List   Diagnosis Date Noted  . Psoriasis 08/23/2013  . Chronic acquired lymphedema 08/21/2013  . Breast cancer, right breast ([place]) 12/26/2011  . Hypertension   . Carcinoma in situ of cervix   . Ovarian cyst   . Cancer ([place])   . Pituitary microadenoma ([place])     [place] 09/17/2017, 5:30 PM  [place], [place], [address] Phone: [phone]   Fax:  [phone]  Name: GALI SPINNEY MRN: [id number] Date of Birth: 1955/10/27  Serafina Royals, PT 09/17/17 5:30 PM

## 2017-09-19 ENCOUNTER — Ambulatory Visit: Payer: 59 | Admitting: Physical Therapy

## 2017-09-19 DIAGNOSIS — G8929 Other chronic pain: Secondary | ICD-10-CM

## 2017-09-19 DIAGNOSIS — M25511 Pain in right shoulder: Secondary | ICD-10-CM

## 2017-09-19 DIAGNOSIS — I972 Postmastectomy lymphedema syndrome: Secondary | ICD-10-CM

## 2017-09-19 NOTE — Therapy (Signed)
[place], [place], [address] Phone: [phone]   Fax:  [phone]  Physical Therapy Treatment  Patient Details  Name: [name] Gibson MRN: [id number] Date of Birth: [birth year] Referring Provider: Thedore Mins   Encounter Date: 09/19/2017  PT End of Session - 09/19/17 1711    Visit Number  24    Number of Visits  25    Date for PT Re-Evaluation  10/10/17    PT Start Time  1606    PT Stop Time  1700    PT Time Calculation (min)  54 min    Activity Tolerance  Patient tolerated treatment well    Behavior During Therapy  [facility] for tasks assessed/performed       Past Medical History:  Diagnosis Date  . Cellulitis of arm, right 08/21/2013  . Complication of anesthesia    some nausea after second mastectomy surgery  . Hypertension   . Ovarian cyst   . Pituitary microadenoma ([place])   . Psoriasis     Past Surgical History:  Procedure Laterality Date  . Anal Fistula Repair    . BREAST SURGERY     Right Mastectomy  . CERVICAL CONE BIOPSY    . CHOLECYSTECTOMY    . COLPOSCOPY    . TONSILLECTOMY      There were no vitals filed for this visit.  Subjective Assessment - 09/19/17 1610    Subjective  Bandages felt okay.    Pertinent History  Right breast cancer diagnosed in May 2009. Had mastectomy, chemo, and radiation; is on tamoxifen for 9 years.  HTN controlled with meds.  Lymphedema started about one year out from treatment; she has been treated here on more than one occasion in the past.    Currently in Pain?  No/denies                      [facility] Adult PT Treatment/Exercise - 09/19/17 0001      Manual Therapy   Edema Management  removed bandages and checked skin, which looks good. Shape of arm looks good today too.    Manual Lymphatic Drainage (MLD)  short neck, superficial and deep abdominals, left axillary nodes and establishment of interaxillary pathway, right inguinal nodes  and establishment of axillo inguinal pathway, R UE working proximal to distal (to fingers) then retracing all steps    Compression Bandaging  Lotion applied then TG soft from hand to axilla, small green Komprex foam pad at dorsal hand, Elastomull x 2  to all fingers, artiflex x2 from hand to axilla, then 1-6 cm. bandage, 1-10 cm bandage, 3-12 cm bandage, with two of the last three 12 cm. bandages in herringbone (one on forearm, one on upper arm), overall from hand to axilla               PT Short Term Goals - 08/10/17 0951      PT SHORT TERM GOAL #1   Title  Patient's right arm circumference at 10 cm. proximal to olecranon will reduce to 48 cm.    Baseline  51.1 cm. on eval compared to 37.7 on the left    Status  Achieved      PT SHORT TERM GOAL #2   Title  Pt.'s right arm circumference at 15 cm. proximal to ulnar styloid will reduceto 42 cm.    Baseline  45.2 cm. right compared to 29.2 on left    Status  Achieved  PT Long Term Goals - 09/12/17 1716      PT LONG TERM GOAL #3   Title  Pt. will have new custom fit compression sleeve and glove.    Status  Partially Met      PT LONG TERM GOAL #4   Title  Pt. will have a plan in place for lymphedema self-management    Status  Partially Met            Plan - 09/19/17 1711    Clinical Impression Statement  Pt.'s arm looked good and felt soft today.  We learned that her garments have come in to the vendor, and we asked the vendor to ship them directly to the patient, since she is experienced with garments.  We will continue to see the patient until all this is complete.    Rehab Potential  Good    PT Frequency  3x / week    PT Duration  8 weeks    PT Treatment/Interventions  ADLs/Self Care Home Management;DME Instruction;Therapeutic exercise;Patient/family education;Orthotic Fit/Training;Manual techniques;Manual lymph drainage;Compression bandaging;Passive range of motion;Taping    PT Next Visit Plan  Continue  complete decongestive therapy, with focus on keeping forearm wrap quite snug, since that at increased in size recently    PT Home Exercise Plan  okay to do her workout at [employer] (treadmill, bike, 30-minute circuit room) with bandages on    Consulted and Agree with Plan of Care  Patient       Patient will benefit from skilled therapeutic intervention in order to improve the following deficits and impairments:  Increased edema, Decreased knowledge of use of DME  Visit Diagnosis: Postmastectomy lymphedema  Chronic right shoulder pain     Problem List Patient Active Problem List   Diagnosis Date Noted  . Psoriasis 08/23/2013  . Chronic acquired lymphedema 08/21/2013  . Breast cancer, right breast ([facility]) 12/26/2011  . Hypertension   . Carcinoma in situ of cervix   . Ovarian cyst   . Cancer ([facility])   . Pituitary microadenoma ([facility])     S[place]ISBUR[place],DONNA 09/19/2017, 5:13 PM  [place] [facility] [address] [place], , [address] Phone: [phone]   Fax:  [phone]  Name: Makalya G Rappleye MRN: [id number] Date of Birth: 03/03/1956  Donna Salisbury, PT 09/19/17 5:13 PM  

## 2017-09-21 ENCOUNTER — Ambulatory Visit: Payer: 59 | Attending: Oncology

## 2017-09-21 DIAGNOSIS — I972 Postmastectomy lymphedema syndrome: Secondary | ICD-10-CM | POA: Insufficient documentation

## 2017-09-21 NOTE — Therapy (Signed)
[place], [place], [address] Phone: [phone]   Fax:  [phone]  Physical Therapy Treatment  Patient Details  Name: [name] Gibson MRN: [id number] Date of Birth: 03-30-1956 Referring Provider: Thedore Mins   Encounter Date: 09/21/2017  PT End of Session - 09/21/17 0852    Visit Number  25    Number of Visits  25    Date for PT Re-Evaluation  10/10/17    PT Start Time  0801    PT Stop Time  0850    PT Time Calculation (min)  49 min    Activity Tolerance  Patient tolerated treatment well    Behavior During Therapy  [name] Gibson for tasks assessed/performed       Past Medical History:  Diagnosis Date  . Cellulitis of arm, right 08/21/2013  . Complication of anesthesia    some nausea after second mastectomy surgery  . Hypertension   . Ovarian cyst   . Pituitary microadenoma ([place])   . Psoriasis     Past Surgical History:  Procedure Laterality Date  . Anal Fistula Repair    . BREAST SURGERY     Right Mastectomy  . CERVICAL CONE BIOPSY    . CHOLECYSTECTOMY    . COLPOSCOPY    . TONSILLECTOMY      There were no vitals filed for this visit.  Subjective Assessment - 09/21/17 0811    Subjective  Nothing new, just ready to get my garments!    Pertinent History  Right breast cancer diagnosed in May 2009. Had mastectomy, chemo, and radiation; is on tamoxifen for 9 years.  HTN controlled with meds.  Lymphedema started about one year out from treatment; she has been treated here on more than one occasion in the past.    Patient Stated Goals  Reduce arm swelling    Currently in Pain?  No/denies                      [facility] Adult PT Treatment/Exercise - 09/21/17 0001      Manual Therapy   Edema Management  removed bandages and checked skin, which looks good except palmar asepect of fourth finger was red and pt reported it feeling like a blister. Shape of arm conts to look good.    Manual  Lymphatic Drainage (MLD)  short neck, superficial and deep abdominals, left axillary nodes and establishment of interaxillary pathway, right inguinal nodes and establishment of axillo inguinal pathway, R UE working proximal to distal (to fingers) then retracing all steps    Compression Bandaging  Lotion applied then TG soft from hand to axilla, small green Komprex foam pad at dorsal hand, Elastomull x 2  to all fingers with 1/4" gray foam at dorsal 4th finger, artiflex x2 from hand to axilla, then 1-6 cm. bandage, 1-10 cm bandage, 3-12 cm bandage, with two of the last three 12 cm. bandages in herringbone (one on forearm, one on upper arm), overall from hand to axilla               PT Short Term Goals - 08/10/17 0951      PT SHORT TERM GOAL #1   Title  Patient's right arm circumference at 10 cm. proximal to olecranon will reduce to 48 cm.    Baseline  51.1 cm. on eval compared to 37.7 on the left    Status  Achieved      PT SHORT TERM GOAL #2  Title  Pt.'s right arm circumference at 15 cm. proximal to ulnar styloid will reduceto 42 cm.    Baseline  45.2 cm. right compared to 29.2 on left    Status  Achieved        PT Long Term Goals - 09/12/17 1716      PT LONG TERM GOAL #3   Title  Pt. will have new custom fit compression sleeve and glove.    Status  Partially Met      PT LONG TERM GOAL #4   Title  Pt. will have a plan in place for lymphedema self-management    Status  Partially Met            Plan - 09/21/17 0853    Clinical Impression Statement  Pt is hopeful her compression garments are going to arrive in next few days have we have heard from fitter. If they come before Monday she will cancel her next visit and R/S for when fitter is due back at our clinic so she and [place] can assess compression gamrnets for final fitting. Pt did have one small area of redness at her fourth Rt palmar aspect of finger. Pt reported this feeling like a bruise. So added 1/4" gray foam here   to prevent further friction from Elastomull bandages. Pt reported this feeling good at end of session.     Rehab Potential  Good    PT Frequency  3x / week    PT Duration  8 weeks    PT Treatment/Interventions  ADLs/Self Care Home Management;DME Instruction;Therapeutic exercise;Patient/family education;Orthotic Fit/Training;Manual techniques;Manual lymph drainage;Compression bandaging;Passive range of motion;Taping    PT Next Visit Plan  If pt is not D/C at next visit she will need renewal. Continue complete decongestive therapy, with focus on keeping forearm wrap quite snug, since that at increased in size recently    Consulted and Agree with Plan of Care  Patient       Patient will benefit from skilled therapeutic intervention in order to improve the following deficits and impairments:  Increased edema, Decreased knowledge of use of DME  Visit Diagnosis: Postmastectomy lymphedema     Problem List Patient Active Problem List   Diagnosis Date Noted  . Psoriasis 08/23/2013  . Chronic acquired lymphedema 08/21/2013  . Breast cancer, right breast ([place]) 12/26/2011  . Hypertension   . Carcinoma in situ of cervix   . Ovarian cyst   . Cancer ([place])   . Pituitary microadenoma [facility])     Otelia Limes, PTA 09/21/2017, 9:01 AM  [place], [place], [address] Phone: [phone]   Fax:  [phone]  Name: [name] Gibson MRN: [id number] Date of Birth: 1955/11/15

## 2017-09-24 ENCOUNTER — Ambulatory Visit: Payer: 59 | Admitting: Rehabilitation

## 2017-09-24 ENCOUNTER — Encounter: Payer: Self-pay | Admitting: Rehabilitation

## 2017-09-24 DIAGNOSIS — I972 Postmastectomy lymphedema syndrome: Secondary | ICD-10-CM

## 2017-09-24 NOTE — Therapy (Signed)
[place], [place], [address] Phone: [phone]   Fax:  [phone]  Physical Therapy Treatment  Patient Details  Name: [name] Gibson MRN: [id number] Date of Birth: 06-09-1956 Referring Provider: Thedore Mins   Encounter Date: 09/24/2017  PT End of Session - 09/24/17 1650    Visit Number  26    Number of Visits  25    Date for PT Re-Evaluation  10/10/17    PT Start Time  1600    PT Stop Time  1645    PT Time Calculation (min)  45 min    Activity Tolerance  Patient tolerated treatment well    Behavior During Therapy  [facility] for tasks assessed/performed       Past Medical History:  Diagnosis Date  . Cellulitis of arm, right 08/21/2013  . Complication of anesthesia    some nausea after second mastectomy surgery  . Hypertension   . Ovarian cyst   . Pituitary microadenoma ([place])   . Psoriasis     Past Surgical History:  Procedure Laterality Date  . Anal Fistula Repair    . BREAST SURGERY     Right Mastectomy  . CERVICAL CONE BIOPSY    . CHOLECYSTECTOMY    . COLPOSCOPY    . TONSILLECTOMY      There were no vitals filed for this visit.  Subjective Assessment - 09/24/17 1558    Subjective  Pt arrives with new compression garment.  Good fit.  Did not receive the night garment.  Will be contacting Shiesha about the night garment coming and meeting her.  Pt will wear her Reid sleeve at night until it arrives.      Patient Stated Goals  Reduce arm swelling    Currently in Pain?  No/denies            LYMPHEDEMA/ONCOLOGY QUESTIONNAIRE - 09/24/17 1612      Right Upper Extremity Lymphedema   15 cm Proximal to Olecranon Process  42 cm    10 cm Proximal to Olecranon Process  41 cm    Olecranon Process  36 cm    15 cm Proximal to Ulnar Styloid Process  33.5 cm    10 cm Proximal to Ulnar Styloid Process  31 cm    Just Proximal to Ulnar Styloid Process  24 cm    Across Hand at [employer]   22.3 cm    At [place] of 2nd Digit  7.5 cm               [facility] Adult PT Treatment/Exercise - 09/24/17 0001      Manual Therapy   Edema Management  bandages removed skin looks good, some concerns of increased elbow volume but measurements only increased at olecranon    Manual Lymphatic Drainage (MLD)  short neck, superficial and deep abdominals, left axillary nodes and establishment of interaxillary pathway, right inguinal nodes and establishment of axillo inguinal pathway, R UE working proximal to distal (to fingers) then retracing all steps               PT Short Term Goals - 09/24/17 1653      PT SHORT TERM GOAL #1   Title  Patient's right arm circumference at 10 cm. proximal to olecranon will reduce to 48 cm.    Status  Achieved      PT SHORT TERM GOAL #2   Title  Pt.'s right arm circumference at 15 cm. proximal to  ulnar styloid will reduceto 42 cm.    Status  Achieved        PT Long Term Goals - 09/24/17 1654      PT LONG TERM GOAL #1   Title  Patient's right arm circumference at 10 cm. proximal to olecranon will reduce to 45 cm. or less.    Status  Achieved      PT LONG TERM GOAL #2   Title  Patient's right arm circumference at 15 cm. proximal to ulnar styloid will reduce to 39 cm. or less.    Status  Partially Met      PT LONG TERM GOAL #3   Title  Pt. will have new custom fit compression sleeve and glove.    Status  Achieved      PT LONG TERM GOAL #4   Title  Pt. will have a plan in place for lymphedema self-management    Status  Achieved      PT LONG TERM GOAL #5   Title  Patient's right arm circumference at 15 cm. proximal to ulnar styloid will reduce to 34 cm. or less.    Status  Achieved      PT LONG TERM GOAL #6   Title  Patient's right arm circumference at 10 cm. proximal to olecranon will reduce to 40 cm. or less.    Status  Partially Met            Plan - 09/24/17 1651    Clinical Impression Statement  Pt arrives with compression  garments today.  Fit looks good and patient able to don them independently.  Circumferential measurements similar to discharge on last PT session episode.  Pt will [facility] in with [facility] regarding night garments and a second set.      PT Next Visit Plan  DC    Consulted and Agree with Plan of Care  Patient       Patient will benefit from skilled therapeutic intervention in order to improve the following deficits and impairments:     Visit Diagnosis: Postmastectomy lymphedema     Problem List Patient Active Problem List   Diagnosis Date Noted  . Psoriasis 08/23/2013  . Chronic acquired lymphedema 08/21/2013  . Breast cancer, right breast ([place]) 12/26/2011  . Hypertension   . Carcinoma in situ of cervix   . Ovarian cyst   . Cancer ([place])   . Pituitary microadenoma [facility])     Shermon Bozzi, Adrian Prince, DPT, CMP, CLT 09/24/2017, 4:58 PM  [place], [place], [address] Phone: [phone]   Fax:  [phone]  Name: [name] Gibson MRN: [id number] Date of Birth: 08-02-1955   PHYSICAL THERAPY DISCHARGE SUMMARY  Visits from Start of Care: 26  Current functional level related to goals / functional outcomes:see above   Remaining deficits: none   Education / Equipment: Compression garments received; ind with self care  Plan: Patient agrees to discharge.  Patient goals were partially met. Patient is being discharged due to meeting the stated rehab goals.  ?????

## 2017-09-26 ENCOUNTER — Encounter: Payer: 59 | Admitting: Rehabilitation

## 2017-09-28 ENCOUNTER — Ambulatory Visit: Payer: 59 | Admitting: Physical Therapy

## 2017-10-15 MED FILL — LISINOPRIL 40 MG TABLET: 40 | 90 days supply | Qty: 90 | Fill #1

## 2017-10-22 MED FILL — ESCITALOPRAM 10 MG TABLET: 10 | 90 days supply | Qty: 90 | Fill #1

## 2017-11-05 MED FILL — POTASSIUM CL ER 20 MEQ TABL: 20 | 90 days supply | Qty: 90 | Fill #0

## 2017-11-05 MED FILL — HYDROCHLOROTHIAZIDE 25 MG T: 25 | 90 days supply | Qty: 90 | Fill #0

## 2017-11-07 DIAGNOSIS — F324 Major depressive disorder, single episode, in partial remission: Secondary | ICD-10-CM | POA: Diagnosis not present

## 2017-11-07 DIAGNOSIS — E559 Vitamin D deficiency, unspecified: Secondary | ICD-10-CM | POA: Diagnosis not present

## 2017-11-07 DIAGNOSIS — Z6841 Body Mass Index (BMI) 40.0 and over, adult: Secondary | ICD-10-CM | POA: Diagnosis not present

## 2017-11-07 DIAGNOSIS — I10 Essential (primary) hypertension: Secondary | ICD-10-CM | POA: Diagnosis not present

## 2017-11-07 DIAGNOSIS — H538 Other visual disturbances: Secondary | ICD-10-CM | POA: Diagnosis not present

## 2017-11-09 ENCOUNTER — Encounter: Payer: Self-pay | Admitting: Oncology

## 2017-11-09 DIAGNOSIS — Z1231 Encounter for screening mammogram for malignant neoplasm of breast: Secondary | ICD-10-CM | POA: Diagnosis not present

## 2017-11-09 DIAGNOSIS — Z853 Personal history of malignant neoplasm of breast: Secondary | ICD-10-CM | POA: Diagnosis not present

## 2017-11-16 DIAGNOSIS — H5010 Unspecified exotropia: Secondary | ICD-10-CM | POA: Diagnosis not present

## 2017-12-07 MED FILL — ZOLPIDEM TARTRATE 10 MG TAB: 10 | 90 days supply | Qty: 90 | Fill #0

## 2017-12-10 ENCOUNTER — Other Ambulatory Visit: Payer: Self-pay | Admitting: Oncology

## 2017-12-10 DIAGNOSIS — Z853 Personal history of malignant neoplasm of breast: Secondary | ICD-10-CM

## 2017-12-10 MED FILL — TAMOXIFEN CITRATE 20 MG TAB: 20 | 90 days supply | Qty: 90 | Fill #0

## 2017-12-12 ENCOUNTER — Inpatient Hospital Stay: Payer: 59 | Attending: Oncology

## 2017-12-12 DIAGNOSIS — C50911 Malignant neoplasm of unspecified site of right female breast: Secondary | ICD-10-CM | POA: Diagnosis not present

## 2017-12-12 DIAGNOSIS — Z853 Personal history of malignant neoplasm of breast: Secondary | ICD-10-CM

## 2017-12-12 DIAGNOSIS — Z170 Estrogen receptor positive status [ER+]: Secondary | ICD-10-CM

## 2017-12-12 LAB — CBC WITH DIFFERENTIAL/PLATELET
BASOS PCT: 1 %
Basophils Absolute: 0.1 10*3/uL (ref 0.0–0.1)
Eosinophils Absolute: 0.2 10*3/uL (ref 0.0–0.5)
Eosinophils Relative: 2 %
HEMATOCRIT: 40.4 % (ref 34.8–46.6)
HEMOGLOBIN: 13.8 g/dL (ref 11.6–15.9)
Lymphocytes Relative: 28 %
Lymphs Abs: 2.6 10*3/uL (ref 0.9–3.3)
MCH: 31.5 pg (ref 25.1–34.0)
MCHC: 34.1 g/dL (ref 31.5–36.0)
MCV: 92.4 fL (ref 79.5–101.0)
MONOS PCT: 8 %
Monocytes Absolute: 0.7 10*3/uL (ref 0.1–0.9)
NEUTROS ABS: 5.7 10*3/uL (ref 1.5–6.5)
NEUTROS PCT: 61 %
Platelets: 286 10*3/uL (ref 145–400)
RBC: 4.37 MIL/uL (ref 3.70–5.45)
RDW: 13.2 % (ref 11.2–14.5)
WBC: 9.3 10*3/uL (ref 3.9–10.3)

## 2017-12-12 LAB — COMPREHENSIVE METABOLIC PANEL
ALBUMIN: 3.7 g/dL (ref 3.5–5.0)
ALK PHOS: 73 U/L (ref 40–150)
ALT: 20 U/L (ref 0–55)
AST: 16 U/L (ref 5–34)
Anion gap: 9 (ref 3–11)
BUN: 16 mg/dL (ref 7–26)
CALCIUM: 9.2 mg/dL (ref 8.4–10.4)
CHLORIDE: 103 mmol/L (ref 98–109)
CO2: 28 mmol/L (ref 22–29)
Creatinine, Ser: 0.75 mg/dL (ref 0.60–1.10)
GFR calc non Af Amer: >60 mL/min (ref >60)
GLUCOSE: 102 mg/dL (ref 70–140)
Potassium: 4.1 mmol/L (ref 3.5–5.1)
SODIUM: 140 mmol/L (ref 136–145)
Total Bilirubin: 0.3 mg/dL (ref 0.2–1.2)
Total Protein: 7 g/dL (ref 6.4–8.3)

## 2017-12-13 LAB — CANCER ANTIGEN 27.29: CA 27.29: 20.1 U/mL (ref 0.0–38.6)

## 2017-12-14 ENCOUNTER — Telehealth: Payer: Self-pay | Admitting: Oncology

## 2017-12-14 NOTE — Telephone Encounter (Signed)
GM PAL 5/29. Per GM moved appointments fot 6/7. Left message for patient. Patient also my chart active.

## 2017-12-19 ENCOUNTER — Ambulatory Visit: Payer: 59 | Admitting: Oncology

## 2017-12-19 ENCOUNTER — Other Ambulatory Visit: Payer: 59

## 2017-12-27 NOTE — Progress Notes (Signed)
ID: [name] Gibson   DOB: 12-08-1955  MR#: [id number]  BZJ#:[id number]  PCP: Harlan Stains, MD OFB:PZWCH Annamaria Boots, NP SU: Johnathan Hausen, MD OTHER MD:  CHIEF COMPLAINT:  Estrogen receptor positive breast cancer  CURRENT TREATMENT: Completing 10 years of tamoxifen tamoxifen   HISTORY OF PRESENT ILLNESS: From the original intake note:  She had her annual screening mammogram on 10-24-07.  This showed a possible area of asymmetry in the right breast and she returned on 10-29-07 for diagnostic mammography and right breast ultrasound.  In the lower inner right breast, there was an area of architectural distortion which by ultrasound measured 2.9 cm.  It was ill-defined and hypoechoic.  In addition, in a different quadrant, there was an ill-defined hypoechoic mass up to 8 mm.  Breast specific gamma imaging was obtained on the same day and indeed showed two areas of abnormal uptake as already described.  The one inferiorly measured 2.2 cm. and the one superiorly 7 mm by breast specific gamma imaging.  The patient underwent ultrasound-guided biopsy of both right breast lesions on 11-04-07.  The final pathology showed:  1. An invasive lobular carcinoma (E-cadherin negative). 2. An invasive ductal carcinoma with some evidence of ductal carcinoma in situ.    Her subsequent history is as detailed below  INTERVAL HISTORY: [name] Gibson returns today for follow-up of her estrogen receptor positive breast cancer. She continues on tamoxifen, with good tolerance. She denies issues with hot flashes or increased vaginal discharge. She just refilled a 90 day supply.  REVIEW OF SYSTEMS: Mia continues to have issues wit her right arm lymphedema. She wears a compression sleeve on her right arm, and she occasionally wears a gloves on her right hand.  She is interested in participating in a study for an experimental drug for  lymphedema, if one becomes available.  She tells me the pump she uses at night is outdated and  does not work very well.  She uses it seldom.  She denies unusual headaches, visual changes, nausea, vomiting, or dizziness. There has been no unusual cough, phlegm production, or pleurisy. This been no change in bowel or bladder habits. She denies unexplained fatigue or unexplained weight loss, bleeding, rash, or fever. A detailed review of systems was otherwise stable.    PAST MEDICAL HISTORY: Past Medical History:  Diagnosis Date  . Cellulitis of arm, right 08/21/2013  . Complication of anesthesia    some nausea after second mastectomy surgery  . Hypertension   . Ovarian cyst   . Pituitary microadenoma ([place])   . Psoriasis   1. History of a possible mitral valve prolapse murmur (the patient currently does not take prophylactic antibiotics before dental procedures). 2. History of Fen-Phen use. 3. History of minimal urinary incontinence. Marland Kitchen    PAST SURGICAL HISTORY: Past Surgical History:  Procedure Laterality Date  . Anal Fistula Repair    . BREAST SURGERY     Right Mastectomy  . CERVICAL CONE BIOPSY    . CHOLECYSTECTOMY    . COLPOSCOPY    . TONSILLECTOMY      FAMILY HISTORY Family History  Problem Relation Age of Onset  . Breast cancer Cousin 59       passed away form disease  . Breast cancer Cousin   . Hypertension Mother   . Melanoma Mother   . Diabetes Father   . Hypertension Father   . Heart disease Father   . Parkinsonism Father   . COPD Brother   The patient's father died  at the age of 60 in the setting of Parkinson's disease and multiple other medical problems.  The patient's mother is alive at 60.  She has a history of foot melanoma.  The patient has one brother and one sister.  There is no breast cancer or ovarian cancer in the immediate family.  In the patient's mother's father's family, however, two daughters of one sister have been diagnosed with cancer, one at the age of 60 who unfortunately succumbed and one at the age of 60.  So this would be two first  cousins out of a large number of cousins (recall that the patient's father was one of 12 siblings).  GYNECOLOGIC HISTORY:  (Updated 12/24/2013) She is GX P2, first pregnancy to term age 62. Last menstrual period prior to chemo in 2009.  She has occasional hot flashes.     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  Her husband, [name] Gibson, is a Naval architect but he is partially disabled with MS, and he is currently doing some mediation.  They have a 35 year old daughter and a 70 year old son.   ADVANCED DIRECTIVES: not in place  HEALTH MAINTENANCE: Social History   Tobacco Use  . Smoking status: Former Research scientist (life sciences)  . Smokeless tobacco: Never Used  Substance Use Topics  . Alcohol use: No  . Drug use: No     Colonoscopy:    Not on file   PAP: April 2015   B[facility] density:  April 2013 at [facility], normal   Lipid panel: not on file/Dr. Dema Severin   Allergies  Allergen Reactions  . Sulfa Antibiotics Hives    Current Outpatient Medications  Medication Sig Dispense Refill  . buPROPion (WELLBUTRIN XL) 150 MG 24 hr tablet   0  . Calcium Carbonate-Vitamin D (CALCIUM + D PO) Take 1 tablet by mouth 2 (two) times daily.     Marland Kitchen escitalopram (LEXAPRO) 10 MG tablet Take 1 tablet (10 mg total) by mouth daily. 90 tablet 4  . glucosamine-chondroitin 500-400 MG tablet Take 1 tablet by mouth 2 (two) times daily.    . hydrochlorothiazide (HYDRODIURIL) 25 MG tablet Take 25 mg by mouth daily.    Marland Kitchen lisinopril (PRINIVIL,ZESTRIL) 40 MG tablet Take 40 mg by mouth daily.    . potassium chloride SA (K-DUR,KLOR-CON) 20 MEQ tablet Take 20 mEq by mouth daily.    . prochlorperazine (COMPAZINE) 10 MG tablet Take 1 tablet (10 mg total) by mouth every 6 (six) hours as needed for nausea or vomiting. (Patient not taking: Reported on 07/11/2017) 30 tablet 0  . tamoxifen (NOLVADEX) 20 MG tablet TAKE 1 TABLET BY MOUTH DAILY. 90  tablet 3  . zolpidem (AMBIEN) 5 MG tablet Take 5 mg by mouth at bedtime as needed.     No current facility-administered medications for this visit.     OBJECTIVE: Morbidly obese white woman who appears well Vitals:   12/28/17 1435  BP: 140/71  Pulse: 97  Resp: 18  Temp: 98.3 F (36.8 C)  SpO2: 97%     Body mass index is 41.25 kg/m.    ECOG FS: 1 Filed Weights   12/28/17 1435  Weight: 257 lb 8 oz (116.8 kg)   Sclerae unicteric, pupils round and equal Oropharynx clear and moist No cervical or supraclavicular adenopathy Lungs no rales or rhonchi Heart regular rate and rhythm Abd soft, nontender, positive bowel sounds MSK no focal spinal tenderness, chronic grade  2 right upper extremity lymphedema Neuro: nonfocal, well oriented, appropriate affect Breasts: The right breast is status post mastectomy.  It has also received radiation.  There is no evidence of local recurrence.  Left breast is benign.  Both axillae are benign.  LAB RESULTS: Lab Results  Component Value Date   WBC 9.3 12/12/2017   NEUTROABS 5.7 12/12/2017   HGB 13.8 12/12/2017   HCT 40.4 12/12/2017   MCV 92.4 12/12/2017   PLT 286 12/12/2017      Chemistry      Component Value Date/Time   NA 140 12/12/2017 1145   NA 141 12/11/2016 1603   K 4.1 12/12/2017 1145   K 3.5 12/11/2016 1603   CL 103 12/12/2017 1145   CL 103 12/20/2012 1054   CO2 28 12/12/2017 1145   CO2 28 12/11/2016 1603   BUN 16 12/12/2017 1145   BUN 14.1 12/11/2016 1603   CREATININE 0.75 12/12/2017 1145   CREATININE 0.8 12/11/2016 1603      Component Value Date/Time   CALCIUM 9.2 12/12/2017 1145   CALCIUM 8.8 12/11/2016 1603   ALKPHOS 73 12/12/2017 1145   ALKPHOS 89 12/11/2016 1603   AST 16 12/12/2017 1145   AST 16 12/11/2016 1603   ALT 20 12/12/2017 1145   ALT 16 12/11/2016 1603   BILITOT 0.3 12/12/2017 1145   BILITOT <0.22 12/11/2016 1603       Lab Results  Component Value Date   LABCA2 22 12/13/2015     STUDIES: No  results found.  ASSESSMENT: 62 y.o.  [place] woman   (1) status post right modified radical mastectomy in May 2009 for a multifocal/multicentric breast carcinoma, the major component being lobular with a minor ductal component, pathologically T2 N1, Stage IIB, grade 2, estrogen and progesterone receptor positive, HER2 negative, with a low MIB-1,  (2) treated adjuvantly with dose-dense doxorubicin/ cyclophosphamide x4 followed by weekly paclitaxel x12, completed October 2009,   (3) radiation completed on December 2009   (4) started tamoxifen December 2009.  the goal is to continue on tamoxifen for a total of 10 years, until December 2019.  (4)  lymphedema, right upper extremity      PLAN:  Lisset is now 10 years out from definitive surgery for her breast cancer with no evidence of disease recurrence.  This is very favorable.  She has been on tamoxifen since December 2009.  She has enough on hand to carry her through September.  I think it is fine for her to stop tamoxifen at that point.  We discussed the fact that we do not have information on the benefit lack of benefit or possible harm of continuing tamoxifen beyond 10 years  I offered to write her a prescription for a new right upper extremity lymphedema pump if one is available better than the one she has and she will let me know.  At this point I would be comfortable releasing her to her primary care physician, but she would like to be seen here in a yearly basis and we are glad to accommodate that  She knows to call for any problems that may develop before next visit.   Fordyce Lepak, Virgie Dad, MD  12/28/17 3:10 PM Medical Oncology and Hematology [facility] [address] [place], [place] [address] Tel. [phone]    Fax. [phone]  Alice Rieger, am acting as scribe for Chauncey Cruel MD.  I, Lurline Del MD, have reviewed the above documentation for accuracy and completeness,  and I agree with  the above.

## 2017-12-28 ENCOUNTER — Inpatient Hospital Stay: Payer: 59 | Attending: Oncology | Admitting: Oncology

## 2017-12-28 ENCOUNTER — Telehealth: Payer: Self-pay | Admitting: Oncology

## 2017-12-28 DIAGNOSIS — Z9221 Personal history of antineoplastic chemotherapy: Secondary | ICD-10-CM | POA: Diagnosis not present

## 2017-12-28 DIAGNOSIS — Z923 Personal history of irradiation: Secondary | ICD-10-CM | POA: Diagnosis not present

## 2017-12-28 DIAGNOSIS — Z87891 Personal history of nicotine dependence: Secondary | ICD-10-CM | POA: Insufficient documentation

## 2017-12-28 DIAGNOSIS — C50811 Malignant neoplasm of overlapping sites of right female breast: Secondary | ICD-10-CM | POA: Insufficient documentation

## 2017-12-28 DIAGNOSIS — I890 Lymphedema, not elsewhere classified: Secondary | ICD-10-CM | POA: Diagnosis not present

## 2017-12-28 DIAGNOSIS — Z853 Personal history of malignant neoplasm of breast: Secondary | ICD-10-CM | POA: Diagnosis not present

## 2017-12-28 DIAGNOSIS — Z170 Estrogen receptor positive status [ER+]: Secondary | ICD-10-CM

## 2017-12-28 DIAGNOSIS — Z9011 Acquired absence of right breast and nipple: Secondary | ICD-10-CM | POA: Insufficient documentation

## 2017-12-28 NOTE — Telephone Encounter (Signed)
Gave patient avs and calendar of upcoming June and July appointments.  °

## 2018-01-14 MED FILL — LISINOPRIL 40 MG TABLET: 40 | 90 days supply | Qty: 90 | Fill #0

## 2018-01-19 DIAGNOSIS — Z9011 Acquired absence of right breast and nipple: Secondary | ICD-10-CM | POA: Diagnosis not present

## 2018-01-19 DIAGNOSIS — I972 Postmastectomy lymphedema syndrome: Secondary | ICD-10-CM | POA: Diagnosis not present

## 2018-01-21 ENCOUNTER — Telehealth: Payer: Self-pay | Admitting: *Deleted

## 2018-01-21 MED ORDER — PROCHLORPERAZINE MALEATE 10 MG PO TABS: 10.0000 mg | ORAL_TABLET | Freq: Four times a day (QID) | ORAL | 1 refills | Status: DC | PRN

## 2018-01-21 MED FILL — PROCHLORPERAZINE 10 MG TAB: 10 | 8 days supply | Qty: 30 | Fill #0

## 2018-01-21 MED FILL — ESCITALOPRAM 10 MG TABLET: 10 | 90 days supply | Qty: 90 | Fill #0

## 2018-01-29 NOTE — Telephone Encounter (Signed)
No entry 

## 2018-02-04 ENCOUNTER — Inpatient Hospital Stay (HOSPITAL_BASED_OUTPATIENT_CLINIC_OR_DEPARTMENT_OTHER): Payer: 59 | Admitting: Genetics

## 2018-02-04 ENCOUNTER — Inpatient Hospital Stay: Payer: 59 | Attending: Oncology

## 2018-02-04 DIAGNOSIS — C50811 Malignant neoplasm of overlapping sites of right female breast: Secondary | ICD-10-CM | POA: Diagnosis not present

## 2018-02-04 DIAGNOSIS — Z803 Family history of malignant neoplasm of breast: Secondary | ICD-10-CM | POA: Diagnosis not present

## 2018-02-04 DIAGNOSIS — Z170 Estrogen receptor positive status [ER+]: Secondary | ICD-10-CM

## 2018-02-04 DIAGNOSIS — Z808 Family history of malignant neoplasm of other organs or systems: Secondary | ICD-10-CM | POA: Diagnosis not present

## 2018-02-04 DIAGNOSIS — C50911 Malignant neoplasm of unspecified site of right female breast: Secondary | ICD-10-CM

## 2018-02-05 ENCOUNTER — Encounter: Payer: Self-pay | Admitting: Genetics

## 2018-02-05 DIAGNOSIS — Z808 Family history of malignant neoplasm of other organs or systems: Secondary | ICD-10-CM | POA: Insufficient documentation

## 2018-02-05 DIAGNOSIS — Z803 Family history of malignant neoplasm of breast: Secondary | ICD-10-CM

## 2018-02-05 HISTORY — DX: Family history of malignant neoplasm of other organs or systems: Z80.8

## 2018-02-05 HISTORY — DX: Family history of malignant neoplasm of breast: Z80.3

## 2018-02-05 NOTE — Progress Notes (Addendum)
REFERRING PROVIDER: Gardenia Phlegm, NP [place], [place] [address]  PRIMARY PROVIDER:  Harlan Stains, MD  PRIMARY REASON FOR VISIT:  1. Malignant neoplasm of right breast in female, estrogen receptor positive, unspecified site of breast ([place])   2. Family history of breast cancer   3. Family history of brain cancer   4. Family history of melanoma   5. Malignant neoplasm of overlapping sites of right breast in female, estrogen receptor positive ([place])     HISTORY OF PRESENT ILLNESS:   [name] Gibson, a 62 y.o. female, was seen for a [place] cancer genetics consultation at the request of Dr. Dema Severin due to a personal and family history of cancer.  [name] Gibson presents to clinic today to discuss the possibility of a hereditary predisposition to cancer, genetic testing, and to further clarify her future cancer risks, as well as potential cancer risks for family members.   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  ER/PR+, HER2-. She underwent chemotherapy, radiation, and antiestrogen therapy. [name] Gibson has BRCA Comprehensive analysis in 2009 that was negative.  She is here to discuss updated genetic testing.   [name] Gibson also has a hx of pituitary microadenoma.   HORMONAL RISK FACTORS:  First live birth at age 78.  Ovaries intact: yes.  Hysterectomy: no.  Menopausal status: postmenopausal.  Colonoscopy: yes; at 41.  Past Medical History:  Diagnosis Date  . Cellulitis of arm, right 08/21/2013  . Complication of anesthesia    some nausea after second mastectomy surgery  . Family history of brain cancer 02/05/2018  . Family history of breast cancer 02/05/2018  . Family history of melanoma 02/05/2018  . Hypertension   . Ovarian cyst   . Pituitary microadenoma ([place])   . Psoriasis     Past  Surgical History:  Procedure Laterality Date  . Anal Fistula Repair    . BREAST SURGERY     Right Mastectomy  . CERVICAL CONE BIOPSY    . CHOLECYSTECTOMY    . COLPOSCOPY    . TONSILLECTOMY      Social History   Socioeconomic History  . Marital status: Married    Spouse name: Not on file  . Number of children: Not on file  . Years of education: Not on file  . Highest education level: Not on file  Occupational History  . Not on file  Social Needs  . Financial resource strain: Not on file  . Food insecurity:    Worry: Not on file    Inability: Not on file  . Transportation needs:    Medical: Not on file    Non-medical: Not on file  Tobacco Use  . Smoking status: Former Research scientist (life sciences)  . Smokeless tobacco: Never Used  Substance and Sexual Activity  . Alcohol use: No  . Drug use: No  . Sexual activity: Yes    Birth control/protection: Post-menopausal  Lifestyle  . Physical activity:    Days per week: Not on file    Minutes per session: Not on file  . Stress: Not on file  Relationships  . Social connections:    Talks on phone: Not on file    Gets together: Not on file    Attends religious service: Not on file    Active member of club or organization: Not on file  Attends meetings of clubs or organizations: Not on file    Relationship status: Not on file  Other Topics Concern  . Not on file  Social History Narrative  . Not on file     FAMILY HISTORY:  We obtained a detailed, 4-generation family history.  Significant diagnoses are listed below: Family History  Problem Relation Age of Onset  . Breast cancer Cousin 88       passed away form disease  . Breast cancer Cousin   . Hypertension Mother   . Melanoma Mother   . Diabetes Father   . Hypertension Father   . Heart disease Father   . Parkinsonism Father   . COPD Brother    [name] Gibson has a 15 year-old daughter and a 32 year-old son with no history of cancer.  [name] Gibson has a brother who is 78 with no  history of cancer, he has 2 daughters.  [name] Gibson also has a sister who is 68 with no history of cancer, she has 2 daughters.   [name] Gibson father: died at 65 due to parkinson's disease/heart disease Paternal Aunts/Uncles: 4 paternal aunts/uncles.  Most lived to old age except 40. 3 maternal aunts had cancer, but the type of cancer is unk.  Paternal cousins: 1 paternal cousins (sisters) with breast cancer.  One died in her 41's due to breast cancer and the other was dx at 45 but survived.  Paternal grandfather: died in his 43's with no history of cancer.  Paternal grandmother:unk  [name] Gibson's mother: died in her 42's, hx of melanoma on the bottom of her foot. (no sun exposure to this area) Maternal Aunts/Uncles: 2 maternal aunts, 1 died of brain cancer dx in her 98's.  The other aunt also had cancer, but the type is unk.  Maternal cousins: 2 female cousins, 1 had cancer, but the type of cancer is unk Maternal grandfather: unk Maternal grandmother:died in childbirth  [name] Gibson is unaware of previous family history of genetic testing for hereditary cancer risks. Patient's maternal ancestors are of N. European descent, and paternal ancestors are of N. European descent. There is no reported Ashkenazi Jewish ancestry. There is no known consanguinity.  GENETIC COUNSELING ASSESSMENT: [name] Gibson is a 62 y.o. female with a personal and family history which is somewhat suggestive of a Hereditary Cancer Predisposition Syndrome. We, therefore, discussed and recommended the following at today's visit.   DISCUSSION: We reviewed the characteristics, features and inheritance patterns of hereditary cancer syndromes. We also discussed genetic testing, including the appropriate family members to test, the process of testing, insurance coverage and turn-around-time for results. We discussed the implications of a negative, positive and/or variant of uncertain significant result. We recommended [name] Gibson  pursue genetic testing for the Multi-Cancer gene panel.   The Multi-Cancer Panel offered by [facility] includes sequencing and/or deletion duplication testing of the following 83 genes: ALK, APC, ATM, AXIN2,BAP1,  BARD1, BLM, BMPR1A, BRCA1, BRCA2, BRIP1, CASR, CDC73, CDH1, CDK4, CDKN1B, CDKN1C, CDKN2A (p14ARF), CDKN2A (p16INK4a), CEBPA, CHEK2, CTNNA1, DICER1, DIS3L2, EGFR (c.2369C>T, p.Thr790Met variant only), EPCAM (Deletion/duplication testing only), FH, FLCN, GATA2, GPC3, GREM1 (Promoter region deletion/duplication testing only), HOXB13 (c.251G>A, p.Gly84Glu), HRAS, KIT, MAX, MEN1, MET, MITF (c.952G>A, p.Glu318Lys variant only), MLH1, MSH2, MSH3, MSH6, MUTYH, NBN, NF1, NF2, NTHL1, PALB2, PDGFRA, PHOX2B, PMS2, POLD1, POLE, POT1, PRKAR1A, PTCH1, PTEN, RAD50, RAD51C, RAD51D, RB1, RECQL4, RET, RUNX1, SDHAF2, SDHA (sequence changes only), SDHB, SDHC, SDHD, SMAD4, SMARCA4, SMARCB1, SMARCE1, STK11, SUFU, TERC, TERT, TMEM127, TP53, TSC1,  TSC2, VHL, WRN and WT1.   We discussed that only 5-10% of cancers are associated with a Hereditary cancer predisposition syndrome.  One of the most common hereditary cancer syndromes that increases breast cancer risk is called Hereditary Breast and Ovarian Cancer (HBOC) syndrome.  This syndrome is caused by mutations in the BRCA1 and BRCA2 genes.  This syndrome increases an individual's lifetime risk to develop breast, ovarian, pancreatic, and other types of cancer.  She has already tested negative for mutations in this gene, however, there are also many other cancer predisposition syndromes caused by mutations in several other genes.  We discussed that if she is found to have a mutation in one of these genes, it may impact future medical management recommendations such as increased cancer screenings and consideration of risk reducing surgeries.  A positive result could also have implications for the patient's family members.  A Negative result would mean we were unable to identify  a hereditary component to her cancer, but does not rule out the possibility of a hereditary basis for her cancer.  There could be mutations that are undetectable by current technology, or in genes not yet tested or identified to increase cancer risk.    We discussed the potential to find a Variant of Uncertain Significance or VUS.  These are variants that have not yet been identified as pathogenic or benign, and it is unknown if this variant is associated with increased cancer risk or if this is a normal finding.  Most VUS's are reclassified to benign or likely benign.   It should not be used to make medical management decisions. With time, we suspect the lab will determine the significance of any VUS's identified if any.   Based on [name] Gibson's personal and family history of cancer, she meets medical criteria for genetic testing. Despite that she meets criteria, she may still have an out of pocket cost. The laboratory can provide her with an estimate of her OOP cost.   PLAN: After considering the risks, benefits, and limitations, [name] Gibson  provided informed consent to pursue genetic testing and the blood sample was sent to [facility] for analysis of the Multi-Cancer Panel. Results should be available within approximately 2-3 weeks' time, at which point they will be disclosed by telephone to [name] Gibson, as will any additional recommendations warranted by these results. [name] Gibson will receive a summary of her genetic counseling visit and a copy of her results once available. This information will also be available in Epic. We encouraged [name] Gibson. [place] to remain in contact with cancer genetics annually so that we can continuously update the family history and inform her of any changes in cancer genetics and testing that may be of benefit for her family. [name] Gibson. [place] questions were answered to her satisfaction today. Our contact information was provided should additional questions or concerns  arise.  Based on [name] Gibson. Harpenau's family history, we recommended her paternal relatives (especially those affected with cancer) also have genetic counseling and testing. [name] Gibson. [place] will let us know if we can be of any assistance in coordinating genetic counseling and/or testing for this family member.   Lastly, we encouraged [name] Gibson. [place] to remain in contact with cancer genetics annually so that we can continuously update the family history and inform her of any changes in cancer genetics and testing that may be of benefit for this family.   [name] Gibson.  [place] questions were answered to her satisfaction today. Our contact information was provided should additional  questions or concerns arise. Thank you for the referral and allowing [place] to share in the care of your patient.   Tana Felts, [name] Gibson, [facility] Certified Genetic Counselor [email or web address] phone: [phone]  The patient was seen for a total of 40 minutes in face-to-face genetic counseling.   This patient was discussed with Drs. Magrinat, Lindi Adie and/or Burr Medico who agrees with the above.

## 2018-02-06 MED FILL — HYDROCHLOROTHIAZIDE 25 MG T: 25 | 90 days supply | Qty: 90 | Fill #0

## 2018-02-11 DIAGNOSIS — L400 Psoriasis vulgaris: Secondary | ICD-10-CM | POA: Diagnosis not present

## 2018-02-11 DIAGNOSIS — Z1283 Encounter for screening for malignant neoplasm of skin: Secondary | ICD-10-CM | POA: Diagnosis not present

## 2018-02-11 DIAGNOSIS — D225 Melanocytic nevi of trunk: Secondary | ICD-10-CM | POA: Diagnosis not present

## 2018-02-11 MED FILL — POTASSIUM CL ER 20 MEQ TAB: 20 | 90 days supply | Qty: 90 | Fill #0

## 2018-02-18 DIAGNOSIS — Z9011 Acquired absence of right breast and nipple: Secondary | ICD-10-CM | POA: Diagnosis not present

## 2018-02-18 DIAGNOSIS — I972 Postmastectomy lymphedema syndrome: Secondary | ICD-10-CM | POA: Diagnosis not present

## 2018-02-19 ENCOUNTER — Telehealth: Payer: Self-pay | Admitting: Genetics

## 2018-02-19 NOTE — Telephone Encounter (Signed)
Revealed negative genetic testing.   This normal result is reassuring and indicates that it is unlikely [name] Gibson's cancer is due to a hereditary cause.  It is unlikely that there is an increased risk of another cancer due to a mutation in one of these genes.  However, genetic testing is not perfect, and cannot definitively rule out a hereditary cause.  It will be important for her to keep in contact with genetics to learn if any additional testing may be needed in the future.     Recommended her paternal relatives also have genetic testing because there could be a genetic cause for their cancer that [name] did not inherit and therefore was not found in her test.

## 2018-02-25 ENCOUNTER — Encounter: Payer: Self-pay | Admitting: Genetics

## 2018-02-25 ENCOUNTER — Ambulatory Visit: Payer: Self-pay | Admitting: Genetics

## 2018-02-25 DIAGNOSIS — Z803 Family history of malignant neoplasm of breast: Secondary | ICD-10-CM

## 2018-02-25 DIAGNOSIS — Z1379 Encounter for other screening for genetic and chromosomal anomalies: Secondary | ICD-10-CM | POA: Insufficient documentation

## 2018-02-25 DIAGNOSIS — Z808 Family history of malignant neoplasm of other organs or systems: Secondary | ICD-10-CM

## 2018-02-25 DIAGNOSIS — Z170 Estrogen receptor positive status [ER+]: Secondary | ICD-10-CM

## 2018-02-25 DIAGNOSIS — C50911 Malignant neoplasm of unspecified site of right female breast: Secondary | ICD-10-CM

## 2018-02-25 DIAGNOSIS — C50811 Malignant neoplasm of overlapping sites of right female breast: Secondary | ICD-10-CM

## 2018-02-25 NOTE — Progress Notes (Signed)
HPI:  [name] Gibson was previously seen in the [facility] clinic on 02/04/2018 due to a personal and family history of cancer and concerns regarding a hereditary predisposition to cancer. Please refer to our prior cancer genetics clinic note for more information regarding [name] Gibson's medical, social and family histories, and our assessment and recommendations, at the time. [name] Gibson recent genetic test results were disclosed to her, as well as recommendations warranted by these results. These results and recommendations are discussed in more detail below.  CANCER HISTORY:  In 2009, at the age of 62, [name] Gibson was diagnosed with multifocal/multicentric breast cancer (Invasive ductal and invasive lobular carcinoma)   She underwent right modified radical mastectomy in May 2009.  ER/PR+, HER2-. She underwent chemotherapy, radiation, and antiestrogen therapy. [name] Gibson has BRCA Comprehensive analysis in 2009 that was negative.  She is here to discuss updated genetic testing.    FAMILY HISTORY:  We obtained a detailed, 4-generation family history.  Significant diagnoses are listed below: Family History  Problem Relation Age of Onset  . Breast cancer Cousin 24       passed away form disease  . Breast cancer Cousin   . Hypertension Mother   . Melanoma Mother   . Diabetes Father   . Hypertension Father   . Heart disease Father   . Parkinsonism Father   . COPD Brother     [name] Gibson has a 16 year-old daughter and a 86 year-old son with no history of cancer.  [name] Gibson has a brother who is 15 with no history of cancer, he has 2 daughters.  [name] Gibson also has a sister who is 56 with no history of cancer, she has 2 daughters.   [name] Gibson father: died at 87 due to parkinson's disease/heart disease Paternal Aunts/Uncles: 9 paternal aunts/uncles.  Most lived to old age except 9. 3 maternal aunts had cancer, but the type of cancer is unk.  Paternal cousins: 1 paternal  cousins (sisters) with breast cancer.  One died in her 34's due to breast cancer and the other was dx at 68 but survived.  Paternal grandfather: died in his 70's with no history of cancer.  Paternal grandmother:unk  [name] Gibson's mother: died in her 10's, hx of melanoma on the bottom of her foot. (no sun exposure to this area) Maternal Aunts/Uncles: 2 maternal aunts, 1 died of brain cancer dx in her 82's.  The other aunt also had cancer, but the type is unk.  Maternal cousins: 2 female cousins, 1 had cancer, but the type of cancer is unk Maternal grandfather: unk Maternal grandmother:died in childbirth  [name] Gibson is unaware of previous family history of genetic testing for hereditary cancer risks. Patient's maternal ancestors are of N. European descent, and paternal ancestors are of N. European descent. There is no reported Ashkenazi Jewish ancestry. There is no known consanguinity.  GENETIC TEST RESULTS: Genetic testing performed through [facility]'s Multi-Cancer pnael reported out on 02/13/2018 showed no pathogenic mutations. The Multi-Cancer Panel offered by [facility] includes sequencing and/or deletion duplication testing of the following 84 genes: AIP, ALK, APC, ATM, AXIN2,BAP1,  BARD1, BLM, BMPR1A, BRCA1, BRCA2, BRIP1, CASR, CDC73, CDH1, CDK4, CDKN1B, CDKN1C, CDKN2A (p14ARF), CDKN2A (p16INK4a), CEBPA, CHEK2, CTNNA1, DICER1, DIS3L2, EGFR (c.2369C>T, p.Thr790Met variant only), EPCAM (Deletion/duplication testing only), FH, FLCN, GATA2, GPC3, GREM1 (Promoter region deletion/duplication testing only), HOXB13 (c.251G>A, p.Gly84Glu), HRAS, KIT, MAX, MEN1, MET, MITF (c.952G>A, p.Glu318Lys variant only), MLH1, MSH2, MSH3, MSH6, MUTYH, NBN, NF1, NF2, NTHL1,  PALB2, PDGFRA, PHOX2B, PMS2, POLD1, POLE, POT1, PRKAR1A, PTCH1, PTEN, RAD50, RAD51C, RAD51D, RB1, RECQL4, RET, RUNX1, SDHAF2, SDHA (sequence changes only), SDHB, SDHC, SDHD, SMAD4, SMARCA4, SMARCB1, SMARCE1, STK11, SUFU, TERC, TERT, TMEM127, TP53, TSC1,  TSC2, VHL, WRN and WT1. .   The test report will be scanned into EPIC and will be located under the Molecular Pathology section of the Results Review tab. A portion of the result report is included below for reference.     We discussed with [name] Gibson that because current genetic testing is not perfect, it is possible there may be a gene mutation in one of these genes that current testing cannot detect, but that chance is small.  We also discussed, that there could be another gene that has not yet been discovered, or that we have not yet tested, that is responsible for the cancer diagnoses in the family. It is also possible there is a hereditary cause for the cancer in the family that [name] Gibson did not inherit and therefore was not identified in her testing.  Therefore, it is important to remain in touch with cancer genetics in the future so that we can continue to offer [name] Gibson the most up to date genetic testing.   ADDITIONAL GENETIC TESTING: We discussed with [name] Gibson that her genetic testing was fairly extensive.  If there are are genes identified to increase cancer risk that can be analyzed in the future, we would be happy to discuss and coordinate this testing at that time.    CANCER SCREENING RECOMMENDATIONS: This negative result means that we were unable to identify a hereditary cause for her personal and family history of cancer at this time.  While reassuring, this result does not rule out a hereditary cause for her cancer.  It is still possible that there could be genetic mutations that are undetectable by current technology, or genetic mutations in genes that have not been tested or identified to increase cancer risk.  Therefore, it is recommended she continue to follow the cancer management and screening guidelines provided by her oncology and primary healthcare provider. An individual's cancer risk is not determined by genetic test results alone.  Overall cancer risk assessment  includes additional factors such as personal medical history, family history, etc.  These should be used to make a personalized plan for cancer prevention and surveillance.    RECOMMENDATIONS FOR FAMILY MEMBERS:  Relatives in this family might be at some increased risk of developing cancer, over the general population risk, simply due to the family history of cancer.  We recommended women in this family have a yearly mammogram beginning at age 11, or 57 years younger than the earliest onset of cancer, an annual clinical breast exam, and perform monthly breast self-exams. Women in this family should also have a gynecological exam as recommended by their primary provider. All family members should have a colonoscopy by age 45 (or as directed by their doctors).  All family members should inform their physicians about the family history of cancer so their doctors can make the most appropriate screening recommendations for them.   Her daughter should begin breast mammogram by the age of 68 (or as directed by doctors)   It is also possible there is a hereditary cause for the cancer in Ms. Taite's family that she did not inherit and therefore was not identified in her. Therefore, we recommended her paternal relatives (especially those affected with cancer), have genetic counseling and testing. Ms. Berland will let  us know if we can be of any assistance in coordinating genetic counseling and/or testing for these family members.   FOLLOW-UP: Lastly, we discussed with Ms. Devilla that cancer genetics is a rapidly advancing field and it is possible that new genetic tests will be appropriate for her and/or her family members in the future. We encouraged her to remain in contact with cancer genetics on an annual basis so we can update her personal and family histories and let her know of advances in cancer genetics that may benefit this family.   Our contact number was provided. Ms. Nissen questions were answered  to her satisfaction, and she knows she is welcome to call us at anytime with additional questions or concerns.   Ferol Luz, MS, [facility] Certified Genetic Counselor [email or web address]

## 2018-03-07 MED FILL — ZOLPIDEM TARTRATE 10 MG TAB: 10 | 90 days supply | Qty: 90 | Fill #0

## 2018-03-13 DIAGNOSIS — I10 Essential (primary) hypertension: Secondary | ICD-10-CM | POA: Diagnosis not present

## 2018-03-27 MED FILL — CLOBETASOL PROPIONATE 0.05: 0.05 | 30 days supply | Qty: 60 | Fill #0

## 2018-04-15 MED FILL — LISINOPRIL 40 MG TABLET: 40 | 90 days supply | Qty: 90 | Fill #1

## 2018-04-23 MED FILL — PROCHLORPERAZINE 10 MG TAB: 10 | 8 days supply | Qty: 30 | Fill #1

## 2018-04-29 MED FILL — ESCITALOPRAM 10 MG TABLET: 10 | 90 days supply | Qty: 90 | Fill #1

## 2018-05-09 MED FILL — HYDROCHLOROTHIAZIDE 25 MG T: 25 | 90 days supply | Qty: 90 | Fill #1

## 2018-05-09 MED FILL — POTASSIUM CL ER 20 MEQ TAB: 20 | 90 days supply | Qty: 90 | Fill #1

## 2018-06-06 MED FILL — ZOLPIDEM TARTRATE 10 MG TAB: 10 | 90 days supply | Qty: 90 | Fill #0

## 2018-06-19 DIAGNOSIS — I10 Essential (primary) hypertension: Secondary | ICD-10-CM | POA: Diagnosis not present

## 2018-06-19 DIAGNOSIS — E559 Vitamin D deficiency, unspecified: Secondary | ICD-10-CM | POA: Diagnosis not present

## 2018-06-19 DIAGNOSIS — R1011 Right upper quadrant pain: Secondary | ICD-10-CM | POA: Diagnosis not present

## 2018-06-19 DIAGNOSIS — M25552 Pain in left hip: Secondary | ICD-10-CM | POA: Diagnosis not present

## 2018-06-19 DIAGNOSIS — M25551 Pain in right hip: Secondary | ICD-10-CM | POA: Diagnosis not present

## 2018-06-19 DIAGNOSIS — Z6837 Body mass index (BMI) 37.0-37.9, adult: Secondary | ICD-10-CM | POA: Diagnosis not present

## 2018-06-19 DIAGNOSIS — F325 Major depressive disorder, single episode, in full remission: Secondary | ICD-10-CM | POA: Diagnosis not present

## 2018-06-19 DIAGNOSIS — R7301 Impaired fasting glucose: Secondary | ICD-10-CM | POA: Diagnosis not present

## 2018-07-11 MED FILL — LISINOPRIL 40 MG TABLET: 40 | 90 days supply | Qty: 90 | Fill #0

## 2018-07-25 MED FILL — ESCITALOPRAM 10 MG TABLET: 10 | 90 days supply | Qty: 90 | Fill #0

## 2018-08-06 MED FILL — HYDROCHLOROTHIAZIDE 25 MG T: 25 | 90 days supply | Qty: 90 | Fill #0

## 2018-08-06 MED FILL — POTASSIUM CHLORIDE CRYS ER: 20 | 90 days supply | Qty: 90 | Fill #0

## 2018-09-04 MED FILL — ZOLPIDEM TARTRATE 10 MG TAB: 10 | 90 days supply | Qty: 90 | Fill #0

## 2018-09-09 DIAGNOSIS — H5203 Hypermetropia, bilateral: Secondary | ICD-10-CM | POA: Diagnosis not present

## 2018-10-10 MED FILL — LISINOPRIL 40 MG TABLET: 40 | 90 days supply | Qty: 90 | Fill #0

## 2018-10-23 MED FILL — HYDROCHLOROTHIAZIDE 25 MG T: 25 | 90 days supply | Qty: 90 | Fill #0

## 2018-10-23 MED FILL — POTASSIUM CHLORIDE CRYS ER: 20 | 90 days supply | Qty: 90 | Fill #0

## 2018-10-23 MED FILL — ESCITALOPRAM 10 MG TABLET: 10 | 90 days supply | Qty: 90 | Fill #0

## 2018-12-02 MED FILL — ZOLPIDEM TARTRATE 10 MG TAB: 10 | 90 days supply | Qty: 90 | Fill #0

## 2018-12-25 ENCOUNTER — Other Ambulatory Visit: Payer: Self-pay

## 2018-12-25 DIAGNOSIS — C50911 Malignant neoplasm of unspecified site of right female breast: Secondary | ICD-10-CM

## 2018-12-26 ENCOUNTER — Other Ambulatory Visit: Payer: Self-pay | Admitting: *Deleted

## 2018-12-26 ENCOUNTER — Other Ambulatory Visit: Payer: Self-pay

## 2018-12-26 ENCOUNTER — Inpatient Hospital Stay: Payer: 59 | Attending: Oncology

## 2018-12-26 DIAGNOSIS — Z923 Personal history of irradiation: Secondary | ICD-10-CM | POA: Insufficient documentation

## 2018-12-26 DIAGNOSIS — Z8279 Family history of other congenital malformations, deformations and chromosomal abnormalities: Secondary | ICD-10-CM | POA: Diagnosis not present

## 2018-12-26 DIAGNOSIS — Z809 Family history of malignant neoplasm, unspecified: Secondary | ICD-10-CM | POA: Diagnosis not present

## 2018-12-26 DIAGNOSIS — Z825 Family history of asthma and other chronic lower respiratory diseases: Secondary | ICD-10-CM | POA: Insufficient documentation

## 2018-12-26 DIAGNOSIS — Z882 Allergy status to sulfonamides status: Secondary | ICD-10-CM | POA: Diagnosis not present

## 2018-12-26 DIAGNOSIS — R232 Flushing: Secondary | ICD-10-CM | POA: Diagnosis not present

## 2018-12-26 DIAGNOSIS — I890 Lymphedema, not elsewhere classified: Secondary | ICD-10-CM | POA: Insufficient documentation

## 2018-12-26 DIAGNOSIS — Z853 Personal history of malignant neoplasm of breast: Secondary | ICD-10-CM | POA: Insufficient documentation

## 2018-12-26 DIAGNOSIS — Z87891 Personal history of nicotine dependence: Secondary | ICD-10-CM | POA: Insufficient documentation

## 2018-12-26 DIAGNOSIS — Z170 Estrogen receptor positive status [ER+]: Secondary | ICD-10-CM | POA: Diagnosis not present

## 2018-12-26 DIAGNOSIS — C50911 Malignant neoplasm of unspecified site of right female breast: Secondary | ICD-10-CM

## 2018-12-26 DIAGNOSIS — Z79899 Other long term (current) drug therapy: Secondary | ICD-10-CM | POA: Diagnosis not present

## 2018-12-26 DIAGNOSIS — Z808 Family history of malignant neoplasm of other organs or systems: Secondary | ICD-10-CM | POA: Diagnosis not present

## 2018-12-26 DIAGNOSIS — Z803 Family history of malignant neoplasm of breast: Secondary | ICD-10-CM | POA: Insufficient documentation

## 2018-12-26 DIAGNOSIS — Z9221 Personal history of antineoplastic chemotherapy: Secondary | ICD-10-CM | POA: Diagnosis not present

## 2018-12-26 DIAGNOSIS — Z820 Family history of epilepsy and other diseases of the nervous system: Secondary | ICD-10-CM | POA: Diagnosis not present

## 2018-12-26 DIAGNOSIS — Z9011 Acquired absence of right breast and nipple: Secondary | ICD-10-CM | POA: Insufficient documentation

## 2018-12-26 DIAGNOSIS — Z8249 Family history of ischemic heart disease and other diseases of the circulatory system: Secondary | ICD-10-CM | POA: Insufficient documentation

## 2018-12-26 DIAGNOSIS — Z833 Family history of diabetes mellitus: Secondary | ICD-10-CM | POA: Diagnosis not present

## 2018-12-26 LAB — CBC WITH DIFFERENTIAL (CANCER CENTER ONLY)
Basophils Absolute: 0.1 10*3/uL (ref 0.0–0.1)
Basophils Relative: 1 %
Eosinophils Absolute: 0.4 10*3/uL (ref 0.0–0.5)
Eosinophils Relative: 4 %
HCT: 43 % (ref 36.0–46.0)
Hemoglobin: 14.4 g/dL (ref 12.0–15.0)
Lymphocytes Relative: 29 %
Lymphs Abs: 3 10*3/uL (ref 0.7–4.0)
MCH: 31.2 pg (ref 26.0–34.0)
MCHC: 33.5 g/dL (ref 30.0–36.0)
MCV: 93.1 fL (ref 80.0–100.0)
Monocytes Absolute: 0.9 10*3/uL (ref 0.1–1.0)
Monocytes Relative: 9 %
Neutro Abs: 5.8 10*3/uL (ref 1.7–7.7)
Neutrophils Relative %: 57 %
Platelet Count: 287 10*3/uL (ref 150–400)
RBC: 4.62 MIL/uL (ref 3.87–5.11)
RDW: 12.8 % (ref 11.5–15.5)
WBC Count: 10.2 10*3/uL (ref 4.0–10.5)
nRBC: 0 % (ref 0.0–0.2)

## 2018-12-26 LAB — CMP (CANCER CENTER ONLY)
ALT: 25 U/L (ref 0–44)
AST: 17 U/L (ref 15–41)
Albumin: 3.9 g/dL (ref 3.5–5.0)
Alkaline Phosphatase: 109 U/L (ref 38–126)
Anion gap: 10 (ref 5–15)
BUN: 20 mg/dL (ref 6–20)
CO2: 28 mmol/L (ref 22–32)
Calcium: 9.3 mg/dL (ref 8.9–10.3)
Chloride: 100 mmol/L (ref 98–111)
Creatinine: 0.71 mg/dL (ref 0.44–1.00)
GFR, Est AFR Am: >60 mL/min (ref >60)
GFR, Estimated: >60 mL/min (ref >60)
Glucose, Bld: 95 mg/dL (ref 70–99)
Potassium: 3.8 mmol/L (ref 3.5–5.1)
Sodium: 138 mmol/L (ref 135–145)
Total Bilirubin: 0.3 mg/dL (ref 0.3–1.2)
Total Protein: 7 g/dL (ref 6.5–8.1)

## 2018-12-27 LAB — CANCER ANTIGEN 27.29: CA 27.29: 28.1 U/mL (ref 0.0–38.6)

## 2019-01-02 ENCOUNTER — Other Ambulatory Visit: Payer: Self-pay

## 2019-01-02 ENCOUNTER — Inpatient Hospital Stay: Payer: 59 | Admitting: Oncology

## 2019-01-02 VITALS — BP 129/74 | HR 103 | Temp 97.8°F | Resp 18 | Ht 66.25 in | Wt 232.4 lb

## 2019-01-02 DIAGNOSIS — Z882 Allergy status to sulfonamides status: Secondary | ICD-10-CM

## 2019-01-02 DIAGNOSIS — Z79899 Other long term (current) drug therapy: Secondary | ICD-10-CM | POA: Diagnosis not present

## 2019-01-02 DIAGNOSIS — Z808 Family history of malignant neoplasm of other organs or systems: Secondary | ICD-10-CM

## 2019-01-02 DIAGNOSIS — Z923 Personal history of irradiation: Secondary | ICD-10-CM

## 2019-01-02 DIAGNOSIS — R232 Flushing: Secondary | ICD-10-CM | POA: Diagnosis not present

## 2019-01-02 DIAGNOSIS — Z8279 Family history of other congenital malformations, deformations and chromosomal abnormalities: Secondary | ICD-10-CM

## 2019-01-02 DIAGNOSIS — Z820 Family history of epilepsy and other diseases of the nervous system: Secondary | ICD-10-CM

## 2019-01-02 DIAGNOSIS — Z87891 Personal history of nicotine dependence: Secondary | ICD-10-CM

## 2019-01-02 DIAGNOSIS — Z8249 Family history of ischemic heart disease and other diseases of the circulatory system: Secondary | ICD-10-CM

## 2019-01-02 DIAGNOSIS — Z170 Estrogen receptor positive status [ER+]: Secondary | ICD-10-CM

## 2019-01-02 DIAGNOSIS — I890 Lymphedema, not elsewhere classified: Secondary | ICD-10-CM

## 2019-01-02 DIAGNOSIS — Z825 Family history of asthma and other chronic lower respiratory diseases: Secondary | ICD-10-CM

## 2019-01-02 DIAGNOSIS — Z9221 Personal history of antineoplastic chemotherapy: Secondary | ICD-10-CM

## 2019-01-02 DIAGNOSIS — C50811 Malignant neoplasm of overlapping sites of right female breast: Secondary | ICD-10-CM

## 2019-01-02 DIAGNOSIS — Z9011 Acquired absence of right breast and nipple: Secondary | ICD-10-CM

## 2019-01-02 DIAGNOSIS — Z853 Personal history of malignant neoplasm of breast: Secondary | ICD-10-CM

## 2019-01-02 DIAGNOSIS — Z833 Family history of diabetes mellitus: Secondary | ICD-10-CM

## 2019-01-02 DIAGNOSIS — Z809 Family history of malignant neoplasm, unspecified: Secondary | ICD-10-CM

## 2019-01-02 DIAGNOSIS — Z803 Family history of malignant neoplasm of breast: Secondary | ICD-10-CM

## 2019-01-02 NOTE — Progress Notes (Signed)
ID: [name]   DOB: Jul 27, 1957  MR#: [id number]  CSN#:[id number]  Patient Care Team: Harlan Stains, MD as PCP - General (Family Medicine) Magrinat, Virgie Dad, MD as Consulting Physician (Oncology) Huel Cote, NP as Consulting Physician (Obstetrics and Gynecology) OTHER MD:  CHIEF COMPLAINT:  Estrogen receptor positive breast cancer  CURRENT TREATMENT: Observation   INTERVAL HISTORY: Cassandria returns today for follow-up of her estrogen receptor positive breast cancer.   She finished tamoxifen in August or September 2019. She reports she started feeling better once she stopped taking it. She has more energy, less feeling "dragged down," more alert, and fewer hot flashes.  Since her last visit, she underwent genetic testing on 02/04/2018. The results were negative for any deleterious mutations.   She also underwent bilateral diagnostic mammography with tomography at [facility] on 11/09/2017 showing: breast density category A; no evidence of malignancy in either breast.  She is scheduled for repeat exam on 01/21/2019.   REVIEW OF SYSTEMS: Cree reports she is working from home. She used to walk around her office and go to the gym. Now that she's at home, she has trouble getting herself to use her treadmill. She notes they have a pool, so she tries to do laps as well. A detailed review of systems was otherwise stable.   HISTORY OF PRESENT ILLNESS: From the original intake note:  She had her annual screening mammogram on 10-24-07.  This showed a possible area of asymmetry in the right breast and she returned on 10-29-07 for diagnostic mammography and right breast ultrasound.  In the lower inner right breast, there was an area of architectural distortion which by ultrasound measured 2.9 cm.  It was ill-defined and hypoechoic.  In addition, in a different quadrant, there was an ill-defined hypoechoic mass up to 8 mm.  Breast specific gamma imaging was obtained on the same day and indeed showed  two areas of abnormal uptake as already described.  The one inferiorly measured 2.2 cm. and the one superiorly 7 mm by breast specific gamma imaging.  The patient underwent ultrasound-guided biopsy of both right breast lesions on 11-04-07.  The final pathology showed:  1. An invasive lobular carcinoma (E-cadherin negative). 2. An invasive ductal carcinoma with some evidence of ductal carcinoma in situ.    Her subsequent history is as detailed below   PAST MEDICAL HISTORY: Past Medical History:  Diagnosis Date  . Cellulitis of arm, right 08/21/2013  . Complication of anesthesia    some nausea after second mastectomy surgery  . Family history of brain cancer 02/05/2018  . Family history of breast cancer 02/05/2018  . Family history of melanoma 02/05/2018  . Hypertension   . Ovarian cyst   . Pituitary microadenoma ([place])   . Psoriasis   History of a possible mitral valve prolapse murmur (the patient currently does not take prophylactic antibiotics before dental procedures); history of Fen-Phen use; history of minimal urinary incontinence.   PAST SURGICAL HISTORY: Past Surgical History:  Procedure Laterality Date  . Anal Fistula Repair    . BREAST SURGERY     Right Mastectomy  . CERVICAL CONE BIOPSY    . CHOLECYSTECTOMY    . COLPOSCOPY    . TONSILLECTOMY      FAMILY HISTORY Family History  Problem Relation Age of Onset  . Breast cancer Cousin        40's, passed away form disease  . Breast cancer Cousin 79  . Hypertension Mother   . Melanoma Mother  bottom of foot  . Diabetes Father   . Hypertension Father   . Heart disease Father   . Parkinsonism Father   . COPD Brother   . Brain cancer Maternal Aunt        rare type of tumor  . Cancer Maternal Aunt        type unk  . Cancer Paternal Aunt        type unk  The patient's father died at the age of 16 in the setting of Parkinson's disease and multiple other medical problems.  The patient's mother is alive at 51.   She has a history of foot melanoma.  The patient has one brother and one sister.  There is no breast cancer or ovarian cancer in the immediate family.  In the patient's mother's father's family, however, two daughters of one sister have been diagnosed with cancer, one at the age of 67 who unfortunately succumbed and one at the age of 58.  So this would be two first cousins out of a large number of cousins (recall that the patient's father was one of 12 siblings).   GYNECOLOGIC HISTORY:  (Updated 12/24/2013) She is GX P2, first pregnancy to term age 23. Last menstrual period prior to chemo in 2009.  She has occasional hot flashes.     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  Her husband, [name], is a Naval architect but he is partially disabled with MS, and he is currently doing some mediation.  They have a 62 year old daughter, who got married in 04/2018, and a 63 year old son, who is studying at [employer].   ADVANCED DIRECTIVES: not in place   HEALTH MAINTENANCE: Social History   Tobacco Use  . Smoking status: Former Research scientist (life sciences)  . Smokeless tobacco: Never Used  Substance Use Topics  . Alcohol use: No  . Drug use: No     Colonoscopy:    Not on file   PAP: April 2015   Bone density:  April 2013 at [facility], normal   Lipid panel: not on file/Dr. Dema Severin   Allergies  Allergen Reactions  . Sulfa Antibiotics Hives    Current Outpatient Medications  Medication Sig Dispense Refill  . Calcium Carbonate-Vitamin D (CALCIUM + D PO) Take 1 tablet by mouth 2 (two) times daily.     Marland Kitchen escitalopram (LEXAPRO) 10 MG tablet Take 1 tablet (10 mg total) by mouth daily. 90 tablet 4  . glucosamine-chondroitin 500-400 MG tablet Take 1 tablet by mouth 2 (two) times daily.    . hydrochlorothiazide (HYDRODIURIL) 25 MG tablet Take 25 mg by mouth daily.    Marland Kitchen lisinopril (PRINIVIL,ZESTRIL) 40 MG  tablet Take 40 mg by mouth daily.    . potassium chloride SA (K-DUR,KLOR-CON) 20 MEQ tablet Take 20 mEq by mouth daily.    . prochlorperazine (COMPAZINE) 10 MG tablet Take 1 tablet (10 mg total) by mouth every 6 (six) hours as needed for nausea or vomiting. 30 tablet 1  . zolpidem (AMBIEN) 5 MG tablet Take 5 mg by mouth at bedtime as needed.     No current facility-administered medications for this visit.     OBJECTIVE: Morbidly obese white woman in no acute distress Vitals:   01/02/19 1504  BP: 129/74  Pulse: (!) 103  Resp: 18  Temp: 97.8 F (36.6 C)  SpO2: 96%     Body mass index is 37.23 kg/m.  ECOG FS: 1 Filed Weights   01/02/19 1504  Weight: 232 lb 6.4 oz (105.4 kg)   Sclerae unicteric, EOMs intact Wearing a mask No cervical or supraclavicular adenopathy Lungs no rales or rhonchi Heart regular rate and rhythm Abd soft, nontender, positive bowel sounds MSK no focal spinal tenderness, no upper extremity lymphedema Neuro: nonfocal, well oriented, appropriate affect Breasts: The right breast is status post mastectomy and radiation.  the telangiectasias are a common result of the radiation.  Left breast is benign.  Both axillae are benign.   LAB RESULTS: Lab Results  Component Value Date   WBC 10.2 12/26/2018   NEUTROABS 5.8 12/26/2018   HGB 14.4 12/26/2018   HCT 43.0 12/26/2018   MCV 93.1 12/26/2018   PLT 287 12/26/2018      Chemistry      Component Value Date/Time   NA 138 12/26/2018 1523   NA 141 12/11/2016 1603   K 3.8 12/26/2018 1523   K 3.5 12/11/2016 1603   CL 100 12/26/2018 1523   CL 103 12/20/2012 1054   CO2 28 12/26/2018 1523   CO2 28 12/11/2016 1603   BUN 20 12/26/2018 1523   BUN 14.1 12/11/2016 1603   CREATININE 0.71 12/26/2018 1523   CREATININE 0.8 12/11/2016 1603      Component Value Date/Time   CALCIUM 9.3 12/26/2018 1523   CALCIUM 8.8 12/11/2016 1603   ALKPHOS 109 12/26/2018 1523   ALKPHOS 89 12/11/2016 1603   AST 17 12/26/2018 1523    AST 16 12/11/2016 1603   ALT 25 12/26/2018 1523   ALT 16 12/11/2016 1603   BILITOT 0.3 12/26/2018 1523   BILITOT <0.22 12/11/2016 1603       Lab Results  Component Value Date   LABCA2 22 12/13/2015     STUDIES: No results found.  ASSESSMENT: 63 y.o.  [place] woman   (1) status post right modified radical mastectomy in May 2009 for a multifocal/multicentric breast carcinoma, the major component being lobular with a minor ductal component, pathologically T2 N1, Stage IIB, grade 2, estrogen and progesterone receptor positive, HER2 negative, with a low MIB-1,  (2) treated adjuvantly with dose-dense doxorubicin/ cyclophosphamide x4 followed by weekly paclitaxel x12, completed October 2009,   (3) radiation completed on December 2009   (4) started tamoxifen December 2009.  the goal is to continue on tamoxifen for a total of 10 years, until December 2019.  (4)  lymphedema, right upper extremity   (5) genetics testing 02/04/2018  (a) Results: Negative, No pathogenic variants identified.  The date of this test report is 02/13/2018. The Multi-Cancer Panel offered by [facility] includes sequencing and/or deletion duplication testing of the following 89 genes:AIP, ALK, APC, ATM, AXIN2,BAP1,  BARD1, BLM, BMPR1A, BRCA1, BRCA2, BRIP1, CASR, CDC73, CDH1, CDK4, CDKN1B, CDKN1C, CDKN2A (p14ARF), CDKN2A (p16INK4a), CEBPA, CHEK2, CTNNA1, DICER1, DIS3L2, EGFR (c.2369C>T, p.Thr790Met variant only), EPCAM (Deletion/duplication testing only), FH, FLCN, GATA2, GPC3, GREM1 (Promoter region deletion/duplication testing only), HOXB13 (c.251G>A, p.Gly84Glu), HRAS, KIT, MAX, MEN1, MET, MITF (c.952G>A, p.Glu318Lys variant only), MLH1, MSH2, MSH3, MSH6, MUTYH, NBN, NF1, NF2, NTHL1, PALB2, PDGFRA, PHOX2B, PMS2, POLD1, POLE, POT1, PRKAR1A, PTCH1, PTEN, RAD50, RAD51C, RAD51D, RB1, RECQL4, RET, RUNX1, SDHAF2, SDHA (sequence changes only), SDHB, SDHC, SDHD, SMAD4, SMARCA4, SMARCB1, SMARCE1, STK11, SUFU, TERC, TERT,  TMEM127, TP53, TSC1, TSC2, VHL, WRN and WT1.      PLAN:  Noemy is now 11 years out from definitive surgery for her breast cancer with no evidence of disease recurrence.  This is very favorable.  She is now under observation.  We are going to schedule her with our survivorship nurse practitioner for July 2021 and then she will return to see me again a year later in July 2022  We discussed exercise issues today and she does have a treadmill at home that she can use during the pandemic before the gyms resume  Otherwise she knows to call for any other issue that may develop before the next visit here.  Magrinat, Virgie Dad, MD  01/02/19 3:41 PM Medical Oncology and Hematology [facility] [address] [place], [place] [address] Tel. [phone]    Fax. [phone]   I, Wilburn Mylar, am acting as scribe for Dr. Virgie Dad. Magrinat.  I, Lurline Del MD, have reviewed the above documentation for accuracy and completeness, and I agree with the above.

## 2019-01-10 MED FILL — LISINOPRIL 40 MG TABLET: 40 | 90 days supply | Qty: 90 | Fill #0

## 2019-01-20 MED FILL — ESCITALOPRAM 10 MG TABLET: 10 | 90 days supply | Qty: 90 | Fill #0

## 2019-01-21 DIAGNOSIS — Z1231 Encounter for screening mammogram for malignant neoplasm of breast: Secondary | ICD-10-CM | POA: Diagnosis not present

## 2019-02-04 MED FILL — HYDROCHLOROTHIAZIDE 25 MG T: 25 | 90 days supply | Qty: 90 | Fill #0

## 2019-02-04 MED FILL — POTASSIUM CHLORIDE CRYS ER: 20 | 90 days supply | Qty: 90 | Fill #0

## 2019-02-18 ENCOUNTER — Encounter: Payer: Self-pay | Admitting: Oncology

## 2019-02-19 DIAGNOSIS — F324 Major depressive disorder, single episode, in partial remission: Secondary | ICD-10-CM | POA: Diagnosis not present

## 2019-02-19 DIAGNOSIS — I10 Essential (primary) hypertension: Secondary | ICD-10-CM | POA: Diagnosis not present

## 2019-02-19 DIAGNOSIS — E6609 Other obesity due to excess calories: Secondary | ICD-10-CM | POA: Diagnosis not present

## 2019-03-19 MED FILL — ZOLPIDEM TARTRATE 10 MG TAB: 10 | 90 days supply | Qty: 90 | Fill #0

## 2019-04-02 MED FILL — LISINOPRIL 40 MG TABLET: 40 | 90 days supply | Qty: 90 | Fill #0

## 2019-04-14 MED FILL — ESCITALOPRAM 10 MG TABLET: 10 | 90 days supply | Qty: 90 | Fill #0

## 2019-05-01 MED FILL — POTASSIUM CHLORIDE CRYS ER: 20 | 90 days supply | Qty: 90 | Fill #0

## 2019-05-01 MED FILL — HYDROCHLOROTHIAZIDE 25 MG T: 25 | 90 days supply | Qty: 90 | Fill #0

## 2019-06-16 ENCOUNTER — Other Ambulatory Visit: Payer: Self-pay

## 2019-06-17 ENCOUNTER — Ambulatory Visit (INDEPENDENT_AMBULATORY_CARE_PROVIDER_SITE_OTHER): Payer: 59 | Admitting: Women's Health

## 2019-06-17 ENCOUNTER — Encounter: Payer: Self-pay | Admitting: Women's Health

## 2019-06-17 VITALS — BP 122/83 | Ht 66.0 in | Wt 232.8 lb

## 2019-06-17 DIAGNOSIS — N83202 Unspecified ovarian cyst, left side: Secondary | ICD-10-CM | POA: Diagnosis not present

## 2019-06-17 DIAGNOSIS — Z01419 Encounter for gynecological examination (general) (routine) without abnormal findings: Secondary | ICD-10-CM

## 2019-06-17 DIAGNOSIS — Z13820 Encounter for screening for osteoporosis: Secondary | ICD-10-CM | POA: Diagnosis not present

## 2019-06-17 NOTE — Progress Notes (Signed)
[name] Gibson 10-28-1955 [id number]    History:   63 y.o. G2P2 MWF presents for annual exam, has no complaints. Postmenopausal on no HRT with no bleeding. 2009 right breast cancer ER PR positive HER-2 negative positive lymph nodes, had mastectomy, radiation, chemotherapy.  Recently completed 10 years of Tamoxifen. Right arm lymphadema present, continues to struggle.  Lymphedema did not present initially after surgery it was after several airplane trips while wearing a compression sleeve.  1988 CIN-3, cone with normal Paps since, last Pap 2016. History of persistent left ovarian cyst 37m, did not return as scheduled, states was very busy with daughters wedding last year.  2013 Dexa T score +1.7 spine, +1.2 hip.  Negative colonoscopy less than 10 years ago Dr. MCollene Mares  Past medical history, past surgical history, family history and social history were all reviewed and documented in the EPIC chart. Works at [employer] permanently working from home. [place] is disabled [place] 2 adult children, [place] Malesmarried, son engaged.   ROS:  A ROS was performed and pertinent positives and negatives are included.  Exam:  Vitals:   06/17/19 1603  BP: 122/83  Weight: 232 lb 12.8 oz (105.6 kg)  Height: _0  (1.676 m)   Body mass index is 37.57 kg/m.   General appearance:   appears well.  Right arm lymphedema approximately 3 times the size of left arm, wearing a compression sleeve Thyroid:  Symmetrical, normal in size, without palpable masses or nodularity. Respiratory  Auscultation:  Clear without wheezing or rhonchi Cardiovascular  Auscultation:  Regular rate, without rubs, murmurs or gallops  Edema/varicosities:  Not grossly evident Abdominal  Soft,nontender, without masses, guarding or rebound.  Liver/spleen:  No organomegaly noted  Hernia:  None appreciated  Skin  Inspection:  Grossly normal   Breasts: Examined lying and sitting.     Right: Absent, masetectomy     Left: Without masses,  retractions, discharge or axillary adenopathy. Gentitourinary   Inguinal/mons:  Normal without inguinal adenopathy  External genitalia:  Normal, mole on right labia  BUS/Urethra/Skene's glands:  Normal  Vagina:  Normal  Cervix:  Normal  Uterus:  Normal in size, shape and contour.  Midline and mobile  Adnexa/parametria:     Rt: Without masses or tenderness.   Lt: Without masses or tenderness.  Anus and perineum: Normal    Assessment/Plan:  63y.o.G2P2 MWF for annual exam with no complaints.   2009 right breast cancer/ER/PR positive HER-2 negative mastectomy, chemo, radiation with severe right arm lymphedema 1988 CIN-3 normal Paps after Persistent left ovarian cyst Obesity Hypertension, anxiety/depression-primary care manages labs and meds  Plan: Congratulated on being 10 years out from breast cancer and completing 10 years of tamoxifen.  Encourage physical therapy as needed for lymphedema.  SBEs, continue annual screening mammograms, calcium rich foods, vitamin D 2000 daily.  Continue daily walking encouraged yoga, home safety, fall prevention discussed.  History of normal DEXA 2013 encouraged to repeat.  Schedule ultrasound to check stability of persistent left ovarian cyst.  Pap with HR HPV typing, new screening guidelines reviewed.   [place] 4:47 PM 06/17/2019

## 2019-06-17 NOTE — Patient Instructions (Addendum)
shingrex shingles vaccine  Health Maintenance for Postmenopausal Women Menopause is a normal process in which your ability to get pregnant comes to an end. This process happens slowly over many months or years, usually between the ages of 46 and 47. Menopause is complete when you have missed your menstrual periods for 12 months. It is important to talk with your health care provider about some of the most common conditions that affect women after menopause (postmenopausal women). These include heart disease, cancer, and bone loss (osteoporosis). Adopting a healthy lifestyle and getting preventive care can help to promote your health and wellness. The actions you take can also lower your chances of developing some of these common conditions. What should I know about menopause? During menopause, you may get a number of symptoms, such as:  Hot flashes. These can be moderate or severe.  Night sweats.  Decrease in sex drive.  Mood swings.  Headaches.  Tiredness.  Irritability.  Memory problems.  Insomnia. Choosing to treat or not to treat these symptoms is a decision that you make with your health care provider. Do I need hormone replacement therapy?  Hormone replacement therapy is effective in treating symptoms that are caused by menopause, such as hot flashes and night sweats.  Hormone replacement carries certain risks, especially as you become older. If you are thinking about using estrogen or estrogen with progestin, discuss the benefits and risks with your health care provider. What is my risk for heart disease and stroke? The risk of heart disease, heart attack, and stroke increases as you age. One of the causes may be a change in the body's hormones during menopause. This can affect how your body uses dietary fats, triglycerides, and cholesterol. Heart attack and stroke are medical emergencies. There are many things that you can do to help prevent heart disease and stroke. Watch  your blood pressure  High blood pressure causes heart disease and increases the risk of stroke. This is more likely to develop in people who have high blood pressure readings, are of African descent, or are overweight.  Have your blood pressure checked: ? Every 3-5 years if you are 75-64 years of age. ? Every year if you are 41 years old or older. Eat a healthy diet   Eat a diet that includes plenty of vegetables, fruits, low-fat dairy products, and lean protein.  Do not eat a lot of foods that are high in solid fats, added sugars, or sodium. Get regular exercise Get regular exercise. This is one of the most important things you can do for your health. Most adults should:  Try to exercise for at least 150 minutes each week. The exercise should increase your heart rate and make you sweat (moderate-intensity exercise).  Try to do strengthening exercises at least twice each week. Do these in addition to the moderate-intensity exercise.  Spend less time sitting. Even light physical activity can be beneficial. Other tips  Work with your health care provider to achieve or maintain a healthy weight.  Do not use any products that contain nicotine or tobacco, such as cigarettes, e-cigarettes, and chewing tobacco. If you need help quitting, ask your health care provider.  Know your numbers. Ask your health care provider to check your cholesterol and your blood sugar (glucose). Continue to have your blood tested as directed by your health care provider. Do I need screening for cancer? Depending on your health history and family history, you may need to have cancer screening at different stages  of your life. This may include screening for:  Breast cancer.  Cervical cancer.  Lung cancer.  Colorectal cancer. What is my risk for osteoporosis? After menopause, you may be at increased risk for osteoporosis. Osteoporosis is a condition in which bone destruction happens more quickly than new bone  creation. To help prevent osteoporosis or the bone fractures that can happen because of osteoporosis, you may take the following actions:  If you are 65-16 years old, get at least 1,000 mg of calcium and at least 600 mg of vitamin D per day.  If you are older than age 39 but younger than age 75, get at least 1,200 mg of calcium and at least 600 mg of vitamin D per day.  If you are older than age 21, get at least 1,200 mg of calcium and at least 800 mg of vitamin D per day. Smoking and drinking excessive alcohol increase the risk of osteoporosis. Eat foods that are rich in calcium and vitamin D, and do weight-bearing exercises several times each week as directed by your health care provider. How does menopause affect my mental health? Depression may occur at any age, but it is more common as you become older. Common symptoms of depression include:  Low or sad mood.  Changes in sleep patterns.  Changes in appetite or eating patterns.  Feeling an overall lack of motivation or enjoyment of activities that you previously enjoyed.  Frequent crying spells. Talk with your health care provider if you think that you are experiencing depression. General instructions See your health care provider for regular wellness exams and vaccines. This may include:  Scheduling regular health, dental, and eye exams.  Getting and maintaining your vaccines. These include: ? Influenza vaccine. Get this vaccine each year before the flu season begins. ? Pneumonia vaccine. ? Shingles vaccine. ? Tetanus, diphtheria, and pertussis (Tdap) booster vaccine. Your health care provider may also recommend other immunizations. Tell your health care provider if you have ever been abused or do not feel safe at home. Summary  Menopause is a normal process in which your ability to get pregnant comes to an end.  This condition causes hot flashes, night sweats, decreased interest in sex, mood swings, headaches, or lack of  sleep.  Treatment for this condition may include hormone replacement therapy.  Take actions to keep yourself healthy, including exercising regularly, eating a healthy diet, watching your weight, and checking your blood pressure and blood sugar levels.  Get screened for cancer and depression. Make sure that you are up to date with all your vaccines. This information is not intended to replace advice given to you by your health care provider. Make sure you discuss any questions you have with your health care provider. Document Released: 09/01/2005 Document Revised: 07/03/2018 Document Reviewed: 07/03/2018 Elsevier Patient Education  [place]. shin

## 2019-06-18 DIAGNOSIS — Z01419 Encounter for gynecological examination (general) (routine) without abnormal findings: Secondary | ICD-10-CM | POA: Diagnosis not present

## 2019-06-21 LAB — PAP, TP IMAGING W/ HPV RNA, RFLX HPV TYPE 16,18/45: HPV DNA High Risk: NOT DETECTED

## 2019-06-23 MED FILL — ZOLPIDEM TARTRATE 10 MG TAB: 10 | 90 days supply | Qty: 90 | Fill #0

## 2019-06-30 ENCOUNTER — Other Ambulatory Visit: Payer: Self-pay

## 2019-07-01 ENCOUNTER — Ambulatory Visit (INDEPENDENT_AMBULATORY_CARE_PROVIDER_SITE_OTHER): Payer: 59 | Admitting: Women's Health

## 2019-07-01 ENCOUNTER — Ambulatory Visit (INDEPENDENT_AMBULATORY_CARE_PROVIDER_SITE_OTHER): Payer: 59

## 2019-07-01 ENCOUNTER — Other Ambulatory Visit: Payer: Self-pay | Admitting: Women's Health

## 2019-07-01 ENCOUNTER — Encounter: Payer: Self-pay | Admitting: Women's Health

## 2019-07-01 VITALS — BP 124/82

## 2019-07-01 DIAGNOSIS — R9389 Abnormal findings on diagnostic imaging of other specified body structures: Secondary | ICD-10-CM | POA: Diagnosis not present

## 2019-07-01 DIAGNOSIS — N83202 Unspecified ovarian cyst, left side: Secondary | ICD-10-CM

## 2019-07-01 DIAGNOSIS — Z853 Personal history of malignant neoplasm of breast: Secondary | ICD-10-CM

## 2019-07-01 DIAGNOSIS — N854 Malposition of uterus: Secondary | ICD-10-CM

## 2019-07-01 NOTE — Patient Instructions (Signed)
Ovarian Cyst     An ovarian cyst is a fluid-filled sac that forms on an ovary. The ovaries are small organs that produce eggs in women. Various types of cysts can form on the ovaries. Some may cause symptoms and require treatment. Most ovarian cysts go away on their own, are not cancerous (are benign), and do not cause problems. Common types of ovarian cysts include:  Functional (follicle) cysts. ? Occur during the menstrual cycle, and usually go away with the next menstrual cycle if you do not get pregnant. ? Usually cause no symptoms.  Endometriomas. ? Are cysts that form from the tissue that lines the uterus (endometrium). ? Are sometimes called "chocolate cysts" because they become filled with blood that turns brown. ? Can cause pain in the lower abdomen during intercourse and during your period.  Cystadenoma cysts. ? Develop from cells on the outside surface of the ovary. ? Can get very large and cause lower abdomen pain and pain with intercourse. ? Can cause severe pain if they twist or break open (rupture).  Dermoid cysts. ? Are sometimes found in both ovaries. ? May contain different kinds of body tissue, such as skin, teeth, hair, or cartilage. ? Usually do not cause symptoms unless they get very big.  Theca lutein cysts. ? Occur when too much of a certain hormone (human chorionic gonadotropin) is produced and overstimulates the ovaries to produce an egg. ? Are most common after having procedures used to assist with the conception of a baby (in vitro fertilization). What are the causes? Ovarian cysts may be caused by:  Ovarian hyperstimulation syndrome. This is a condition that can develop from taking fertility medicines. It causes multiple large ovarian cysts to form.  Polycystic ovarian syndrome (PCOS). This is a common hormonal disorder that can cause ovarian cysts, as well as problems with your period or fertility. What increases the risk? The following factors may  make you more likely to develop ovarian cysts:  Being overweight or obese.  Taking fertility medicines.  Taking certain forms of hormonal birth control.  Smoking. What are the signs or symptoms? Many ovarian cysts do not cause symptoms. If symptoms are present, they may include:  Pelvic pain or pressure.  Pain in the lower abdomen.  Pain during sex.  Abdominal swelling.  Abnormal menstrual periods.  Increasing pain with menstrual periods. How is this diagnosed? These cysts are commonly found during a routine pelvic exam. You may have tests to find out more about the cyst, such as:  Ultrasound.  X-ray of the pelvis.  CT scan.  MRI.  Blood tests. How is this treated? Many ovarian cysts go away on their own without treatment. Your health care provider may want to check your cyst regularly for 2-3 months to see if it changes. If you are in menopause, it is especially important to have your cyst monitored closely because menopausal women have a higher rate of ovarian cancer. When treatment is needed, it may include:  Medicines to help relieve pain.  A procedure to drain the cyst (aspiration).  Surgery to remove the whole cyst.  Hormone treatment or birth control pills. These methods are sometimes used to help dissolve a cyst. Follow these instructions at home:  Take over-the-counter and prescription medicines only as told by your health care provider.  Do not drive or use heavy machinery while taking prescription pain medicine.  Get regular pelvic exams and Pap tests as often as told by your health care provider.    Return to your normal activities as told by your health care provider. Ask your health care provider what activities are safe for you.  Do not use any products that contain nicotine or tobacco, such as cigarettes and e-cigarettes. If you need help quitting, ask your health care provider.  Keep all follow-up visits as told by your health care provider.  This is important. Contact a health care provider if:  Your periods are late, irregular, or painful, or they stop.  You have pelvic pain that does not go away.  You have pressure on your bladder or trouble emptying your bladder completely.  You have pain during sex.  You have any of the following in your abdomen: ? A feeling of fullness. ? Pressure. ? Discomfort. ? Pain that does not go away. ? Swelling.  You feel generally ill.  You become constipated.  You lose your appetite.  You develop severe acne.  You start to have more body hair and facial hair.  You are gaining weight or losing weight without changing your exercise and eating habits.  You think you may be pregnant. Get help right away if:  You have abdominal pain that is severe or gets worse.  You cannot eat or drink without vomiting.  You suddenly develop a fever.  Your menstrual period is much heavier than usual. This information is not intended to replace advice given to you by your health care provider. Make sure you discuss any questions you have with your health care provider. Document Released: 07/10/2005 Document Revised: 10/08/2017 Document Reviewed: 12/12/2015 Elsevier Patient Education  2020 Elsevier Inc.  

## 2019-07-01 NOTE — Progress Notes (Signed)
63 year old MWF G2 P2 presents for ultrasound follow-up of persistent left ovarian cyst with a low Ca  125 that was found coincidentally greater than 10 years ago.  Last ultrasound 2017 left ovarian cyst 44 mm mean, endometrium 6.4 mm.  Denies pelvic pain, pressure.  Postmenopausal on no HRT with no bleeding.  2009 right breast cancer ER/PR positive HER-2 negative positive lymph nodes had mastectomy, radiation and chemotherapy.  Negative BRCA.  Completed 10 years of tamoxifen 2019.  Right arm lymphedema continues to be a problem.  Works at [employer], permanently working from home.  Medical problems include hypertension, anxiety and depression.  Husband disabled from [place].  2 children both doing well.  Exam: Appears well.  Ultrasound: Comparison is made with  scan from 2017.  Anteverted uterus, normal size and shape, no myometrial masses.  Endometrium with focal echogenic thickened area of 5.2 mm feeder vessel identified 3D imaging suggest 1.4 cm endometrial mass.  Right ovary normal.  Left adnexal avascular cystic mass enlarged from previous scan to 6.5 x 5.3 x 3.7 cm.  Unable to identify ovarian tissue separate from the mass.  No free fluid.  Slightly larger left ovarian cyst Probable endometrial polyp 2009 left breast cancer  BRCA negative  Plan: Ca 125 pending, schedule consult with Dr. Dellis Filbert for cystectomy and hysteroscopy with polypectomy.  Reviewed all appears benign at this time, but cyst has grown, probable endometrial polyp needs to be removed.  Reassurance given.

## 2019-07-02 ENCOUNTER — Other Ambulatory Visit: Payer: Self-pay

## 2019-07-02 LAB — CA 125: CA 125: 17 U/mL (ref <35)

## 2019-07-03 ENCOUNTER — Ambulatory Visit (INDEPENDENT_AMBULATORY_CARE_PROVIDER_SITE_OTHER): Payer: 59

## 2019-07-03 DIAGNOSIS — Z780 Asymptomatic menopausal state: Secondary | ICD-10-CM | POA: Diagnosis not present

## 2019-07-03 DIAGNOSIS — Z13820 Encounter for screening for osteoporosis: Secondary | ICD-10-CM

## 2019-07-03 MED FILL — LISINOPRIL 40 MG TABLET: 40 | 90 days supply | Qty: 90 | Fill #1

## 2019-07-04 ENCOUNTER — Other Ambulatory Visit: Payer: Self-pay | Admitting: Obstetrics & Gynecology

## 2019-07-04 ENCOUNTER — Other Ambulatory Visit: Payer: Self-pay | Admitting: Women's Health

## 2019-07-04 DIAGNOSIS — Z13820 Encounter for screening for osteoporosis: Secondary | ICD-10-CM

## 2019-07-04 DIAGNOSIS — Z780 Asymptomatic menopausal state: Secondary | ICD-10-CM

## 2019-07-15 MED FILL — ESCITALOPRAM 10 MG TABLET: 10 | 90 days supply | Qty: 90 | Fill #1

## 2019-08-01 ENCOUNTER — Ambulatory Visit: Payer: 59 | Admitting: Obstetrics & Gynecology

## 2019-08-04 MED FILL — POTASSIUM CHLORIDE CRYS ER: 20 | 90 days supply | Qty: 90 | Fill #1

## 2019-08-04 MED FILL — HYDROCHLOROTHIAZIDE 25 MG T: 25 | 90 days supply | Qty: 90 | Fill #1

## 2019-08-11 ENCOUNTER — Ambulatory Visit: Payer: 59 | Admitting: Obstetrics & Gynecology

## 2019-08-11 ENCOUNTER — Encounter: Payer: Self-pay | Admitting: Obstetrics & Gynecology

## 2019-08-11 ENCOUNTER — Other Ambulatory Visit: Payer: Self-pay

## 2019-08-11 VITALS — BP 132/86 | Ht 66.0 in | Wt 239.4 lb

## 2019-08-11 DIAGNOSIS — N840 Polyp of corpus uteri: Secondary | ICD-10-CM | POA: Diagnosis not present

## 2019-08-11 DIAGNOSIS — N83202 Unspecified ovarian cyst, left side: Secondary | ICD-10-CM | POA: Diagnosis not present

## 2019-08-11 DIAGNOSIS — Z170 Estrogen receptor positive status [ER+]: Secondary | ICD-10-CM

## 2019-08-11 DIAGNOSIS — Z853 Personal history of malignant neoplasm of breast: Secondary | ICD-10-CM

## 2019-08-11 DIAGNOSIS — C50811 Malignant neoplasm of overlapping sites of right female breast: Secondary | ICD-10-CM

## 2019-08-11 NOTE — Patient Instructions (Signed)
1. Left ovarian cyst Persistent, increased in size, simple left ovarian cyst now measuring 6.5 x 5.3 x 3.7 cm.  CA-125 normal July 01, 2019 at 17.  History of estrogen receptor positive right breast cancer post right mastectomy.  BRCA 1 and 2 -.  Decision to proceed with a laparoscopy bilateral salpingo-oophorectomy with peritoneal washings.  Management discussed with patient.  Given the probable endometrial polyp, will proceed with a hysteroscopy, MyoSure excision and D&C at the same time.  Surgery and risks reviewed with patient.  Will organize a preop tele-visit.    2. Endometrial polyp Probable 1.4 cm endometrial polyp.  We will proceed with hysteroscopy MyoSure excision and D&C as described above.  3. Malignant neoplasm of overlapping sites of right breast in female, estrogen receptor positive ([place]) Status post right mastectomy.  [name] Gibson, it was a pleasure meeting you today!

## 2019-08-11 NOTE — Progress Notes (Signed)
    [name] Gibson 1955/08/03 [id number]        63 y.o.  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  Finished 10 yrs of Tamoxifen.  BrCa 1-2 Negative.  Persistent, increased in size simple Left Ovarian Cyst.  Ca 125 normal at 17 on 07/01/2019.  Probable Endometrial Polyp on Pelvic US 07/01/2019.  No pelvic pain.  No PMB.     OB History  Gravida Para Term Preterm AB Living  '2 2 2     2  '$ SAB TAB Ectopic Multiple Live Births               # Outcome Date GA Lbr Len/2nd Weight Sex Delivery Anes PTL Lv  2 Term           1 Term             Past medical history,surgical history, problem list, medications, allergies, family history and social history were all reviewed and documented in the EPIC chart.   Directed ROS with pertinent positives and negatives documented in the history of present illness/assessment and plan.  Exam:  Vitals:   08/11/19 0850  BP: 132/86  Weight: 239 lb 6.4 oz (108.6 kg)  Height: '5\' 6"'$  (1.676 m)   General appearance:  Normal  Gynecologic exam: Deferred  Pelvic US 07/01/2019: Anteverted uterus, normal size and shape, no myometrial masses.  Endometrium with focal echogenic thickened area  5.2 mm, feeder vessel identified 3D imaging suggest 1.4 cm endometrial mass.  Right ovary normal.  Left adnexal avascular cystic mass enlarged from previous scan (2017) to 6.5 x 5.3 x 3.7 cm.  Unable to identify ovarian tissue separate from the mass.  No free fluid.  Previous Pelvic US in7/22017:  Left Ovarian Cyst cyst 4.3 x 4.2 x 4.0 cm  Ca125 07/01/2019 normal at 17   Assessment/Plan:  64 y.o. G2P2002   1. Left ovarian cyst Persistent, increased in size, simple left ovarian cyst now measuring 6.5 x 5.3 x 3.7 cm.  CA-125 normal July 01, 2019 at 17.  History of estrogen receptor positive right breast cancer post right mastectomy.  BRCA 1 and 2 -.  Decision to proceed with a laparoscopy bilateral  salpingo-oophorectomy with peritoneal washings.  Management discussed with patient.  Given the probable endometrial polyp, will proceed with a hysteroscopy, MyoSure excision and D&C at the same time.  Surgery and risks reviewed with patient.  Will organize a preop tele-visit.    2. Endometrial polyp Probable 1.4 cm endometrial polyp.  We will proceed with hysteroscopy MyoSure excision and D&C as described above.  3. Malignant neoplasm of overlapping sites of right breast in female, estrogen receptor positive ([place]) Status post right mastectomy.  Counseling on above issues and coordination of care more than 50% for 25 minutes.  Princess Bruins MD, 9:29 AM 08/11/2019

## 2019-08-12 ENCOUNTER — Telehealth: Payer: Self-pay

## 2019-08-12 NOTE — Telephone Encounter (Signed)
I spoke with patient about scheduling surgery. We discussed her insurance benefits and estimated surgery prepymt to [facility] due by one week prior to surgery.  We discussed Covid screen and quarantine needed pre operatively. She asked even if she had both vaccines and enough time for immunity. I emailed Tiffany and she said at this time pre op Covid screens continue. Patient advised.  Patient thinks she would like to wait on a day in March. I will call her when I get the March schedule and we will discuss dates.

## 2019-08-20 NOTE — Telephone Encounter (Signed)
I called patient to inform I have March schedule ready.  She asked for some dates to consider. Per her request I left dates/times in her voice mail and asked her to consider and call me with her decision.

## 2019-08-21 DIAGNOSIS — F419 Anxiety disorder, unspecified: Secondary | ICD-10-CM | POA: Diagnosis not present

## 2019-08-21 DIAGNOSIS — F325 Major depressive disorder, single episode, in full remission: Secondary | ICD-10-CM | POA: Diagnosis not present

## 2019-08-21 DIAGNOSIS — I10 Essential (primary) hypertension: Secondary | ICD-10-CM | POA: Diagnosis not present

## 2019-08-21 DIAGNOSIS — E559 Vitamin D deficiency, unspecified: Secondary | ICD-10-CM | POA: Diagnosis not present

## 2019-08-21 DIAGNOSIS — M545 Low back pain: Secondary | ICD-10-CM | POA: Diagnosis not present

## 2019-08-21 MED FILL — CYCLOBENZAPRINE 10 MG TAB: 10 | 30 days supply | Qty: 30 | Fill #0

## 2019-09-10 ENCOUNTER — Encounter: Payer: Self-pay | Admitting: Gynecology

## 2019-09-15 DIAGNOSIS — H5203 Hypermetropia, bilateral: Secondary | ICD-10-CM | POA: Diagnosis not present

## 2019-09-24 MED FILL — CYCLOBENZAPRINE HCL 10 MG T: 10 | 30 days supply | Qty: 30 | Fill #1

## 2019-09-30 ENCOUNTER — Other Ambulatory Visit: Payer: Self-pay

## 2019-09-30 ENCOUNTER — Telehealth (INDEPENDENT_AMBULATORY_CARE_PROVIDER_SITE_OTHER): Payer: 59 | Admitting: Obstetrics & Gynecology

## 2019-09-30 DIAGNOSIS — N83202 Unspecified ovarian cyst, left side: Secondary | ICD-10-CM

## 2019-09-30 DIAGNOSIS — C50811 Malignant neoplasm of overlapping sites of right female breast: Secondary | ICD-10-CM

## 2019-09-30 DIAGNOSIS — N840 Polyp of corpus uteri: Secondary | ICD-10-CM | POA: Diagnosis not present

## 2019-09-30 DIAGNOSIS — Z170 Estrogen receptor positive status [ER+]: Secondary | ICD-10-CM

## 2019-09-30 NOTE — Progress Notes (Signed)
    [name] Gibson 01/29/1956 [id number]        64 y.o.  G2P2002 Televisit by Video.  Verbal consent obtained and patient well identified.  Preop counseling for laparoscopy peritoneal washings with BSO.  Counseling for 25 minutes between patient and her home and myself in my [place] office.  RP: Preop LPS BSO, Peritoneal washings/HSC Myosure Excision, D+C  HPI: H/O Rt Breast Ca post Rt Mastectomy.  Finished 10 yrs of Tamoxifen.  BrCa 1-2 Negative.  Persistent, increased in size simple Left Ovarian Cyst.  Ca 125 normal at 17 on 07/01/2019.  Probable Endometrial Polyp on Pelvic US 07/01/2019.  No pelvic pain.  No PMB.     OB History  Gravida Para Term Preterm AB Living  '2 2 2     2  '$ SAB TAB Ectopic Multiple Live Births               # Outcome Date GA Lbr Len/2nd Weight Sex Delivery Anes PTL Lv  2 Term           1 Term             Past medical history,surgical history, problem list, medications, allergies, family history and social history were all reviewed and documented in the EPIC chart.   Directed ROS with pertinent positives and negatives documented in the history of present illness/assessment and plan.  Exam:  There were no vitals filed for this visit. General appearance:  Normal  Televisit  Pelvic US 07/01/2019: Anteverted uterus, normal size and shape, no myometrial masses.  Endometrium with focal echogenic thickened area  5.2 mm, feeder vessel identified 3D imaging suggest 1.4 cm endometrial mass.  Right ovary normal.  Left adnexal avascular cystic mass enlarged from previous scan (2017) to 6.5 x 5.3 x 3.7 cm.  Unable to identify ovarian tissue separate from the mass.  No free fluid.   Assessment/Plan:  64 y.o. G2P2002   1. Left ovarian cyst Left ovarian avascular cystic mass increased in size since 2017 now measured at 6.5 x 5.3 x 3.7 cm.  CA-125 normal at 17.  History of right breast cancer post right mastectomy, finished with tamoxifen for 10 years and BRCA 1 and 2  negative.  Decision to proceed with laparoscopic peritoneal washings and BSO.  We will also do a hysteroscopy with excision of endometrial polyp and D&C.  Surgery discussed including preop preparation, surgery with risks and postop precautions.  2. Endometrial polyp Focal echogenic thickening, probable endometrial polyp measuring 1.4 cm with a feeder vessel identified.  3. Malignant neoplasm of overlapping sites of right breast in female, estrogen receptor positive ([place]) Right breast cancer status post right mastectomy.  Finished with tamoxifen treatment.  BRCA 1 and 2 negative.                        Patient was counseled as to the risk of surgery to include the following:  1. Infection (prohylactic antibiotics will be administered)  2. DVT/Pulmonary Embolism (prophylactic pneumo compression stockings will be used)  3.Trauma to internal organs requiring additional surgical procedure to repair any injury to internal organs requiring perhaps additional hospitalization days.  4.Hemmorhage requiring transfusion and blood products which carry risks such as  anaphylactic reaction, hepatitis and AIDS  Patient had received literature information on the procedure scheduled and all her questions were answered and fully accepts all risks.   Princess Bruins MD, 12:42 PM 09/30/2019

## 2019-10-02 ENCOUNTER — Telehealth: Payer: Self-pay

## 2019-10-02 NOTE — Telephone Encounter (Signed)
Patient had called yesterday afternoon want to cancel surgery.  I cancelled everything this morning and called her with dates. She was under the impression she could be rescheduled in 2 weeks but actually it is May before we have OR time available.  She asked if anyway to hold the time and let her talk with husband and his MD at hospital to see if she still might be able to do surgery. I held the time until I hear from her at what time I will need to flip the two cases in the OR that morning in our block with Dr. Marthann Schiller case to follow.  Also, will need to r/s Covid screen for her. She will call me by tomorrow to confirm and pay prepymt if going to have surgery.

## 2019-10-02 NOTE — Telephone Encounter (Signed)
Patient called to say her husband is still hospitalized and will be. She wants to cancel surgery 10/08/19 and rescheduled. I called her back to day and left message that I received her message and took care of cancelling everything. I left her next available dates I have for rescheduling and asked her to call me in the next day or two and let me know what she would like to do.

## 2019-10-03 ENCOUNTER — Telehealth: Payer: Self-pay

## 2019-10-03 MED FILL — LISINOPRIL 40 MG TABLET: 40 | 90 days supply | Qty: 90 | Fill #0

## 2019-10-03 NOTE — Telephone Encounter (Signed)
[name] Gauss, RN called because patient is 64 yo and Dr. Dellis Filbert has orderd a UPT.  I called her back and told her at [facility] Dr. Dellis Filbert wrote "Postmenopausal on no HRT with no bleeding." so okay to cancel the UPT.

## 2019-10-03 NOTE — Telephone Encounter (Signed)
I called patient to follow up on her decision about surgery. She said husband is being discharged home this morning and daughter is going to assist. She wants to proceed with re-scheduling surgery for 10/08/19 as originally planned. I scheduled her for Covid test tomorrow morning and reviewed quarantine protocol with her.  Surgery rescheduled.

## 2019-10-04 ENCOUNTER — Other Ambulatory Visit (HOSPITAL_COMMUNITY)
Admission: RE | Admit: 2019-10-04 | Discharge: 2019-10-04 | Disposition: A | Discharge status: Home / Self Care | Payer: 59 | Source: Ambulatory Visit | Attending: Obstetrics & Gynecology | Admitting: Obstetrics & Gynecology

## 2019-10-04 ENCOUNTER — Other Ambulatory Visit (HOSPITAL_COMMUNITY): Payer: 59

## 2019-10-04 ENCOUNTER — Encounter: Payer: Self-pay | Admitting: Obstetrics & Gynecology

## 2019-10-04 DIAGNOSIS — Z20822 Contact with and (suspected) exposure to covid-19: Secondary | ICD-10-CM | POA: Diagnosis not present

## 2019-10-04 DIAGNOSIS — Z01812 Encounter for preprocedural laboratory examination: Secondary | ICD-10-CM | POA: Diagnosis not present

## 2019-10-04 LAB — SARS CORONAVIRUS 2 (TAT 6-24 HRS): SARS Coronavirus 2: NEGATIVE

## 2019-10-04 NOTE — Patient Instructions (Signed)
1. Left ovarian cyst Left ovarian avascular cystic mass increased in size since 2017 now measured at 6.5 x 5.3 x 3.7 cm.  CA-125 normal at 17.  History of right breast cancer post right mastectomy, finished with tamoxifen for 10 years and BRCA 1 and 2 negative.  Decision to proceed with laparoscopic peritoneal washings and BSO.  We will also do a hysteroscopy with excision of endometrial polyp and D&C.  Surgery discussed including preop preparation, surgery with risks and postop precautions.  2. Endometrial polyp Focal echogenic thickening, probable endometrial polyp measuring 1.4 cm with a feeder vessel identified.  3. Malignant neoplasm of overlapping sites of right breast in female, estrogen receptor positive ([place]) Right breast cancer status post right mastectomy.  Finished with tamoxifen treatment.  BRCA 1 and 2 negative.                        Patient was counseled as to the risk of surgery to include the following:  1. Infection (prohylactic antibiotics will be administered)  2. DVT/Pulmonary Embolism (prophylactic pneumo compression stockings will be used)  3.Trauma to internal organs requiring additional surgical procedure to repair any injury to internal organs requiring perhaps additional hospitalization days.  4.Hemmorhage requiring transfusion and blood products which carry risks such as  anaphylactic reaction, hepatitis and AIDS  Patient had received literature information on the procedure scheduled and all her questions were answered and fully accepts all risks.

## 2019-10-06 ENCOUNTER — Encounter (HOSPITAL_COMMUNITY)
Admission: RE | Admit: 2019-10-06 | Discharge: 2019-10-06 | Disposition: A | Discharge status: Home / Self Care | Payer: 59 | Source: Ambulatory Visit | Attending: Obstetrics & Gynecology | Admitting: Obstetrics & Gynecology

## 2019-10-06 ENCOUNTER — Encounter (HOSPITAL_BASED_OUTPATIENT_CLINIC_OR_DEPARTMENT_OTHER): Payer: Self-pay | Admitting: Obstetrics & Gynecology

## 2019-10-06 ENCOUNTER — Other Ambulatory Visit: Payer: Self-pay

## 2019-10-06 DIAGNOSIS — Z01818 Encounter for other preprocedural examination: Secondary | ICD-10-CM | POA: Diagnosis not present

## 2019-10-06 LAB — CBC
HCT: 45.9 % (ref 36.0–46.0)
Hemoglobin: 15.3 g/dL — ABNORMAL HIGH (ref 12.0–15.0)
MCH: 31.7 pg (ref 26.0–34.0)
MCHC: 33.3 g/dL (ref 30.0–36.0)
MCV: 95 fL (ref 80.0–100.0)
Platelets: 314 10*3/uL (ref 150–400)
RBC: 4.83 MIL/uL (ref 3.87–5.11)
RDW: 12.7 % (ref 11.5–15.5)
WBC: 9.3 10*3/uL (ref 4.0–10.5)
nRBC: 0 % (ref 0.0–0.2)

## 2019-10-06 LAB — BASIC METABOLIC PANEL
Anion gap: 11 (ref 5–15)
BUN: 17 mg/dL (ref 8–23)
CO2: 30 mmol/L (ref 22–32)
Calcium: 9.3 mg/dL (ref 8.9–10.3)
Chloride: 101 mmol/L (ref 98–111)
Creatinine, Ser: 0.93 mg/dL (ref 0.44–1.00)
GFR calc Af Amer: >60 mL/min (ref >60)
GFR calc non Af Amer: >60 mL/min (ref >60)
Glucose, Bld: 115 mg/dL — ABNORMAL HIGH (ref 70–99)
Potassium: 5.2 mmol/L — ABNORMAL HIGH (ref 3.5–5.1)
Sodium: 142 mmol/L (ref 135–145)

## 2019-10-06 LAB — ABO/RH: ABO/RH(D): O POS

## 2019-10-06 NOTE — Progress Notes (Signed)
Spoke w/ via phone for pre-op interview--- PT Lab needs dos---- none             Lab results------ pt getting CBC,BMP,T&S, EKG today 10-06-2019 @ 1430 COVID test ------ done 10-04-2019 Arrive at ------- 0700 NPO after ------  MN Medications to take morning of surgery ----- NONE Diabetic medication ----- n/a Patient Special Instructions ----- n/a Pre-Op special Istructions ----- pt hx right breast mastectomy w/ node dissection and has right upper arm lymphedema ,  NO IV / BP  Patient verbalized understanding of instructions that were given at this phone interview. Patient denies shortness of breath, chest pain, fever, cough a this phone interview.   Anesthesia :  No review needed prior to surgery  PCP:  Dr Harlan Stains Oncologist:  Dr Jana Hakim  ([place] 01-02-2019 epic, no recurrence) Cardiologist :  no Chest x-ray :  12-23-2013 epic EKG : 01-28-2014 epic Echo : 12-24-2007  Epic ETT;  03-02-2014 epic Cardiac Cath :   no Sleep Study/ CPAP :  NO Fasting Blood Sugar :      / Checks Blood Sugar -- times a day:   N/A Blood Thinner/ Instructions Maryjane Hurter Dose: NO ASA / Instructions/ Last Dose :  NO

## 2019-10-07 ENCOUNTER — Telehealth: Payer: Self-pay

## 2019-10-07 NOTE — Telephone Encounter (Signed)
Patient called because she saw her EKG in My chart and thinks there is an error with the result. Would like to talk with someone about it and possible a re-do on the test.

## 2019-10-07 NOTE — Telephone Encounter (Addendum)
I spoke with Langley Gauss at [facility] to find out what is going on with her EKG result. Langley Gauss confirmed the result is fine for surgery tomorrow per anesthesia guidelines.  I spoke with Dr. Dellis Filbert and she had viewed result and is fine with surgery tomorrow as well.  I spoke with patient and relayed this to her. She continued to question the results and I told her it would be no problem to cancel her surgery for in the morning and schedule her an appointment with cardiologist to get a work up and get clearance for surgery.  I told her it was her decision to make.  She asked about having her PCP take a look at the EKG and give her advice. She wants me to leave the surgery scheduled "for now" and she is going to contact her PCP.

## 2019-10-07 NOTE — Telephone Encounter (Signed)
Refer to Cardio for surgical clearance.

## 2019-10-08 ENCOUNTER — Encounter (HOSPITAL_BASED_OUTPATIENT_CLINIC_OR_DEPARTMENT_OTHER): Payer: Self-pay | Admitting: Obstetrics & Gynecology

## 2019-10-08 ENCOUNTER — Ambulatory Visit (HOSPITAL_BASED_OUTPATIENT_CLINIC_OR_DEPARTMENT_OTHER): Payer: 59 | Admitting: Anesthesiology

## 2019-10-08 ENCOUNTER — Encounter (HOSPITAL_BASED_OUTPATIENT_CLINIC_OR_DEPARTMENT_OTHER): Payer: Self-pay

## 2019-10-08 ENCOUNTER — Ambulatory Visit (HOSPITAL_BASED_OUTPATIENT_CLINIC_OR_DEPARTMENT_OTHER): Admit: 2019-10-08 | Payer: 59 | Admitting: Obstetrics & Gynecology

## 2019-10-08 ENCOUNTER — Encounter (HOSPITAL_BASED_OUTPATIENT_CLINIC_OR_DEPARTMENT_OTHER)
Admission: RE | Disposition: A | Discharge status: Home / Self Care | Payer: Self-pay | Source: Ambulatory Visit | Attending: Obstetrics & Gynecology

## 2019-10-08 ENCOUNTER — Other Ambulatory Visit: Payer: Self-pay

## 2019-10-08 ENCOUNTER — Ambulatory Visit (HOSPITAL_BASED_OUTPATIENT_CLINIC_OR_DEPARTMENT_OTHER)
Admission: RE | Admit: 2019-10-08 | Discharge: 2019-10-08 | Disposition: A | Discharge status: Home / Self Care | Payer: 59 | Source: Ambulatory Visit | Attending: Obstetrics & Gynecology | Admitting: Obstetrics & Gynecology

## 2019-10-08 DIAGNOSIS — Z9889 Other specified postprocedural states: Secondary | ICD-10-CM

## 2019-10-08 DIAGNOSIS — Z9221 Personal history of antineoplastic chemotherapy: Secondary | ICD-10-CM | POA: Diagnosis not present

## 2019-10-08 DIAGNOSIS — Z853 Personal history of malignant neoplasm of breast: Secondary | ICD-10-CM | POA: Insufficient documentation

## 2019-10-08 DIAGNOSIS — N840 Polyp of corpus uteri: Secondary | ICD-10-CM | POA: Diagnosis not present

## 2019-10-08 DIAGNOSIS — Z87891 Personal history of nicotine dependence: Secondary | ICD-10-CM | POA: Insufficient documentation

## 2019-10-08 DIAGNOSIS — Z882 Allergy status to sulfonamides status: Secondary | ICD-10-CM | POA: Diagnosis not present

## 2019-10-08 DIAGNOSIS — Z9049 Acquired absence of other specified parts of digestive tract: Secondary | ICD-10-CM | POA: Insufficient documentation

## 2019-10-08 DIAGNOSIS — Z79899 Other long term (current) drug therapy: Secondary | ICD-10-CM | POA: Diagnosis not present

## 2019-10-08 DIAGNOSIS — M1990 Unspecified osteoarthritis, unspecified site: Secondary | ICD-10-CM | POA: Diagnosis not present

## 2019-10-08 DIAGNOSIS — N83201 Unspecified ovarian cyst, right side: Secondary | ICD-10-CM | POA: Diagnosis not present

## 2019-10-08 DIAGNOSIS — Z803 Family history of malignant neoplasm of breast: Secondary | ICD-10-CM | POA: Insufficient documentation

## 2019-10-08 DIAGNOSIS — Z8249 Family history of ischemic heart disease and other diseases of the circulatory system: Secondary | ICD-10-CM | POA: Diagnosis not present

## 2019-10-08 DIAGNOSIS — N83292 Other ovarian cyst, left side: Secondary | ICD-10-CM | POA: Insufficient documentation

## 2019-10-08 DIAGNOSIS — R403 Persistent vegetative state: Secondary | ICD-10-CM | POA: Diagnosis not present

## 2019-10-08 DIAGNOSIS — N83202 Unspecified ovarian cyst, left side: Secondary | ICD-10-CM

## 2019-10-08 DIAGNOSIS — Z9011 Acquired absence of right breast and nipple: Secondary | ICD-10-CM | POA: Insufficient documentation

## 2019-10-08 HISTORY — DX: Estrogen receptor positive status (ER+): Z17.0

## 2019-10-08 HISTORY — DX: Polyp of corpus uteri: N84.0

## 2019-10-08 HISTORY — DX: Unspecified osteoarthritis, unspecified site: M19.90

## 2019-10-08 HISTORY — DX: Personal history of cervical dysplasia: Z87.410

## 2019-10-08 HISTORY — DX: Personal history of other benign neoplasm: Z86.018

## 2019-10-08 HISTORY — DX: Lymphedema, not elsewhere classified: I89.0

## 2019-10-08 HISTORY — DX: Personal history of antineoplastic chemotherapy: Z92.21

## 2019-10-08 HISTORY — DX: Nausea with vomiting, unspecified: R11.2

## 2019-10-08 HISTORY — DX: Estrogen receptor positive status (ER+): C50.811

## 2019-10-08 HISTORY — PX: DILATATION & CURETTAGE/HYSTEROSCOPY WITH MYOSURE: SHX6511

## 2019-10-08 HISTORY — DX: Personal history of irradiation: Z92.3

## 2019-10-08 HISTORY — DX: Presence of spectacles and contact lenses: Z97.3

## 2019-10-08 HISTORY — DX: Personal history of diseases of the skin and subcutaneous tissue: Z87.2

## 2019-10-08 HISTORY — DX: Cardiac murmur, unspecified: R01.1

## 2019-10-08 HISTORY — DX: Other specified postprocedural states: Z98.890

## 2019-10-08 HISTORY — PX: LAPAROSCOPIC BILATERAL SALPINGO OOPHERECTOMY: SHX5890

## 2019-10-08 LAB — TYPE AND SCREEN
ABO/RH(D): O POS
Antibody Screen: NEGATIVE

## 2019-10-08 SURGERY — SALPINGO-OOPHORECTOMY, BILATERAL, LAPAROSCOPIC
Anesthesia: General | Site: Vagina

## 2019-10-08 SURGERY — SALPINGO-OOPHORECTOMY, BILATERAL, LAPAROSCOPIC
Anesthesia: General

## 2019-10-08 MED ORDER — PROMETHAZINE HCL 12.5 MG PO TABS: 12.5000 mg | ORAL_TABLET | Freq: Four times a day (QID) | ORAL | 0 refills | Status: DC | PRN

## 2019-10-08 MED ORDER — DIPHENHYDRAMINE HCL 50 MG/ML IJ SOLN
INTRAMUSCULAR | Status: AC
  Filled 2019-10-08: qty 1

## 2019-10-08 MED ORDER — ROCURONIUM BROMIDE 10 MG/ML (PF) SYRINGE
PREFILLED_SYRINGE | INTRAVENOUS | Status: DC | PRN
  Administered 2019-10-08: 50 mg via INTRAVENOUS
  Administered 2019-10-08 (×2): 30 mg via INTRAVENOUS
  Administered 2019-10-08: 20 mg via INTRAVENOUS

## 2019-10-08 MED ORDER — BUPIVACAINE HCL (PF) 0.25 % IJ SOLN
INTRAMUSCULAR | Status: DC | PRN
  Administered 2019-10-08: 20 mL

## 2019-10-08 MED ORDER — MIDAZOLAM HCL 2 MG/2ML IJ SOLN
INTRAMUSCULAR | Status: DC | PRN
  Administered 2019-10-08 (×2): 1 mg via INTRAVENOUS

## 2019-10-08 MED ORDER — ONDANSETRON HCL 4 MG/2ML IJ SOLN
4.0000 mg | Freq: Once | INTRAMUSCULAR | Status: DC | PRN
  Filled 2019-10-08: qty 2

## 2019-10-08 MED ORDER — LIDOCAINE 2% (20 MG/ML) 5 ML SYRINGE
INTRAMUSCULAR | Status: DC | PRN
  Administered 2019-10-08: 100 mg via INTRAVENOUS

## 2019-10-08 MED ORDER — SCOPOLAMINE 1 MG/3DAYS TD PT72
1.0000 | MEDICATED_PATCH | TRANSDERMAL | Status: DC
  Administered 2019-10-08: 1.5 mg via TRANSDERMAL
  Filled 2019-10-08: qty 1

## 2019-10-08 MED ORDER — OXYCODONE HCL 5 MG PO TABS
5.0000 mg | ORAL_TABLET | Freq: Once | ORAL | Status: DC | PRN
  Filled 2019-10-08: qty 1

## 2019-10-08 MED ORDER — ONDANSETRON HCL 4 MG/2ML IJ SOLN
INTRAMUSCULAR | Status: DC | PRN
  Administered 2019-10-08 (×2): 4 mg via INTRAVENOUS

## 2019-10-08 MED ORDER — ROCURONIUM BROMIDE 10 MG/ML (PF) SYRINGE
PREFILLED_SYRINGE | INTRAVENOUS | Status: AC
  Filled 2019-10-08: qty 20

## 2019-10-08 MED ORDER — PROPOFOL 10 MG/ML IV BOLUS
INTRAVENOUS | Status: DC | PRN
  Administered 2019-10-08: 200 mg via INTRAVENOUS
  Administered 2019-10-08: 30 mg via INTRAVENOUS

## 2019-10-08 MED ORDER — MIDAZOLAM HCL 2 MG/2ML IJ SOLN
INTRAMUSCULAR | Status: AC
  Filled 2019-10-08: qty 2

## 2019-10-08 MED ORDER — FENTANYL CITRATE (PF) 250 MCG/5ML IJ SOLN
INTRAMUSCULAR | Status: AC
  Filled 2019-10-08: qty 5

## 2019-10-08 MED ORDER — SCOPOLAMINE 1 MG/3DAYS TD PT72
MEDICATED_PATCH | TRANSDERMAL | Status: AC
  Filled 2019-10-08: qty 1

## 2019-10-08 MED ORDER — LACTATED RINGERS IV SOLN
INTRAVENOUS | Status: DC
  Filled 2019-10-08: qty 1000

## 2019-10-08 MED ORDER — HYDROMORPHONE HCL 1 MG/ML IJ SOLN
0.2500 mg | INTRAMUSCULAR | Status: DC | PRN
  Filled 2019-10-08: qty 0.5

## 2019-10-08 MED ORDER — OXYCODONE HCL 5 MG/5ML PO SOLN
5.0000 mg | Freq: Once | ORAL | Status: DC | PRN
  Filled 2019-10-08: qty 5

## 2019-10-08 MED ORDER — DEXAMETHASONE SODIUM PHOSPHATE 10 MG/ML IJ SOLN
INTRAMUSCULAR | Status: DC | PRN
  Administered 2019-10-08: 10 mg via INTRAVENOUS

## 2019-10-08 MED ORDER — DEXAMETHASONE SODIUM PHOSPHATE 10 MG/ML IJ SOLN
INTRAMUSCULAR | Status: AC
  Filled 2019-10-08: qty 1

## 2019-10-08 MED ORDER — SODIUM CHLORIDE 0.9 % IV SOLN
2.0000 g | INTRAVENOUS | Status: AC
  Administered 2019-10-08: 09:00:00 2 g via INTRAVENOUS
  Filled 2019-10-08: qty 2

## 2019-10-08 MED ORDER — SUGAMMADEX SODIUM 200 MG/2ML IV SOLN
INTRAVENOUS | Status: DC | PRN
  Administered 2019-10-08: 300 mg via INTRAVENOUS

## 2019-10-08 MED ORDER — LACTATED RINGERS IV SOLN: INTRAVENOUS | Status: DC | PRN

## 2019-10-08 MED ORDER — FENTANYL CITRATE (PF) 100 MCG/2ML IJ SOLN
INTRAMUSCULAR | Status: DC | PRN
  Administered 2019-10-08: 50 ug via INTRAVENOUS
  Administered 2019-10-08: 100 ug via INTRAVENOUS

## 2019-10-08 MED ORDER — SODIUM CHLORIDE 0.9 % IV SOLN
INTRAVENOUS | Status: AC
  Filled 2019-10-08: qty 2

## 2019-10-08 MED ORDER — LIDOCAINE HCL 1 % IJ SOLN
INTRAMUSCULAR | Status: DC | PRN
  Administered 2019-10-08: 20 mL

## 2019-10-08 MED ORDER — PROPOFOL 500 MG/50ML IV EMUL
INTRAVENOUS | Status: DC | PRN
  Administered 2019-10-08: 150 ug/kg/min via INTRAVENOUS

## 2019-10-08 MED ORDER — PROPOFOL 10 MG/ML IV BOLUS
INTRAVENOUS | Status: AC
  Filled 2019-10-08: qty 20

## 2019-10-08 MED ORDER — OXYCODONE-ACETAMINOPHEN 7.5-325 MG PO TABS: 1.0000 | ORAL_TABLET | Freq: Four times a day (QID) | ORAL | 0 refills | Status: DC | PRN

## 2019-10-08 MED ORDER — ONDANSETRON HCL 4 MG/2ML IJ SOLN
INTRAMUSCULAR | Status: AC
  Filled 2019-10-08: qty 2

## 2019-10-08 MED ORDER — SUGAMMADEX SODIUM 500 MG/5ML IV SOLN
INTRAVENOUS | Status: AC
  Filled 2019-10-08: qty 5

## 2019-10-08 MED ORDER — SODIUM CHLORIDE 0.9 % IR SOLN
Status: DC | PRN
  Administered 2019-10-08: 3000 mL

## 2019-10-08 MED ORDER — PHENYLEPHRINE 40 MCG/ML (10ML) SYRINGE FOR IV PUSH (FOR BLOOD PRESSURE SUPPORT)
PREFILLED_SYRINGE | INTRAVENOUS | Status: DC | PRN
  Administered 2019-10-08: 80 ug via INTRAVENOUS
  Administered 2019-10-08: 120 ug via INTRAVENOUS
  Administered 2019-10-08: 80 ug via INTRAVENOUS

## 2019-10-08 MED ORDER — DEXMEDETOMIDINE HCL IN NACL 200 MCG/50ML IV SOLN
INTRAVENOUS | Status: DC | PRN
  Administered 2019-10-08: .4 ug/kg/h via INTRAVENOUS

## 2019-10-08 MED ORDER — PHENYLEPHRINE 40 MCG/ML (10ML) SYRINGE FOR IV PUSH (FOR BLOOD PRESSURE SUPPORT)
PREFILLED_SYRINGE | INTRAVENOUS | Status: AC
  Filled 2019-10-08: qty 10

## 2019-10-08 MED ORDER — DEXMEDETOMIDINE HCL IN NACL 200 MCG/50ML IV SOLN
INTRAVENOUS | Status: AC
  Filled 2019-10-08: qty 50

## 2019-10-08 MED ORDER — DIPHENHYDRAMINE HCL 50 MG/ML IJ SOLN
INTRAMUSCULAR | Status: DC | PRN
  Administered 2019-10-08: 12.5 mg via INTRAVENOUS

## 2019-10-08 MED ORDER — KETOROLAC TROMETHAMINE 30 MG/ML IJ SOLN
30.0000 mg | Freq: Once | INTRAMUSCULAR | Status: DC | PRN
  Filled 2019-10-08: qty 1

## 2019-10-08 MED ORDER — PROPOFOL 500 MG/50ML IV EMUL
INTRAVENOUS | Status: AC
  Filled 2019-10-08: qty 100

## 2019-10-08 MED ORDER — LIDOCAINE 2% (20 MG/ML) 5 ML SYRINGE
INTRAMUSCULAR | Status: AC
  Filled 2019-10-08: qty 5

## 2019-10-08 MED FILL — PROMETHAZINE 12.5 MG TABLET: 12.5 | 8 days supply | Qty: 30 | Fill #0

## 2019-10-08 MED FILL — OXYCODONE-APAP 7.5-325MG: 7.5-325 | 5 days supply | Qty: 20 | Fill #0

## 2019-10-08 SURGICAL SUPPLY — 74 items
ADH SKN CLS APL DERMABOND .7 (GAUZE/BANDAGES/DRESSINGS) ×2
APL SRG 38 LTWT LNG FL B (MISCELLANEOUS)
APPLICATOR ARISTA FLEXITIP XL (MISCELLANEOUS) IMPLANT
BAG LAPAROSCOPIC 12 15 PORT 16 (BASKET) IMPLANT
BAG RETRIEVAL 10MM (BASKET) ×1
BAG RETRIEVAL 12/15 (BASKET)
BAG RETRIEVAL 12/15MM (BASKET)
BARRIER ADHS 3X4 INTERCEED (GAUZE/BANDAGES/DRESSINGS) IMPLANT
BRR ADH 4X3 ABS CNTRL BYND (GAUZE/BANDAGES/DRESSINGS)
CABLE HIGH FREQUENCY MONO STRZ (ELECTRODE) IMPLANT
CANISTER SUCT 3000ML PPV (MISCELLANEOUS) ×4 IMPLANT
CATH ROBINSON RED A/P 16FR (CATHETERS) ×2 IMPLANT
CNTNR URN SCR LID CUP LEK RST (MISCELLANEOUS) ×2 IMPLANT
CONT SPEC 4OZ STRL OR WHT (MISCELLANEOUS) ×4
COVER MAYO STAND STRL (DRAPES) ×4 IMPLANT
COVER WAND RF STERILE (DRAPES) ×4 IMPLANT
DERMABOND ADVANCED (GAUZE/BANDAGES/DRESSINGS) ×2
DERMABOND ADVANCED .7 DNX12 (GAUZE/BANDAGES/DRESSINGS) ×2 IMPLANT
DEVICE MYOSURE LITE (MISCELLANEOUS) IMPLANT
DEVICE MYOSURE REACH (MISCELLANEOUS) ×2 IMPLANT
DILATOR CANAL MILEX (MISCELLANEOUS) IMPLANT
DRSG COVADERM PLUS 2X2 (GAUZE/BANDAGES/DRESSINGS) IMPLANT
DRSG OPSITE POSTOP 3X4 (GAUZE/BANDAGES/DRESSINGS) IMPLANT
DURAPREP 26ML APPLICATOR (WOUND CARE) ×4 IMPLANT
ELECT REM PT RETURN 9FT ADLT (ELECTROSURGICAL)
ELECTRODE REM PT RTRN 9FT ADLT (ELECTROSURGICAL) IMPLANT
GAUZE 4X4 16PLY RFD (DISPOSABLE) ×4 IMPLANT
GLOVE BIO SURGEON STRL SZ 6.5 (GLOVE) ×3 IMPLANT
GLOVE BIO SURGEON STRL SZ7.5 (GLOVE) IMPLANT
GLOVE BIO SURGEONS STRL SZ 6.5 (GLOVE) ×1
GLOVE BIOGEL PI IND STRL 7.0 (GLOVE) ×4 IMPLANT
GLOVE BIOGEL PI INDICATOR 7.0 (GLOVE) ×4
GOWN STRL REUS W/TWL LRG LVL3 (GOWN DISPOSABLE) ×8 IMPLANT
GOWN STRL REUS W/TWL XL LVL3 (GOWN DISPOSABLE) IMPLANT
HEMOSTAT ARISTA ABSORB 3G PWDR (HEMOSTASIS) IMPLANT
HOLDER FOLEY CATH W/STRAP (MISCELLANEOUS) ×2 IMPLANT
IV NS IRRIG 3000ML ARTHROMATIC (IV SOLUTION) ×8 IMPLANT
KIT PROCEDURE FLUENT (KITS) ×4 IMPLANT
LIGASURE VESSEL 5MM BLUNT TIP (ELECTROSURGICAL) ×4 IMPLANT
MANIPULATOR UTERINE 4.5 ZUMI (MISCELLANEOUS) IMPLANT
MYOSURE XL FIBROID (MISCELLANEOUS)
NDL SAFETY ECLIPSE 18X1.5 (NEEDLE) IMPLANT
NEEDLE HYPO 18GX1.5 SHARP (NEEDLE)
PACK LAPAROSCOPY BASIN (CUSTOM PROCEDURE TRAY) ×4 IMPLANT
PACK TRENDGUARD 450 HYBRID PRO (MISCELLANEOUS) ×2 IMPLANT
PACK VAGINAL MINOR WOMEN LF (CUSTOM PROCEDURE TRAY) ×4 IMPLANT
PAD OB MATERNITY 4.3X12.25 (PERSONAL CARE ITEMS) ×4 IMPLANT
PAD PREP 24X48 CUFFED NSTRL (MISCELLANEOUS) ×4 IMPLANT
PROTECTOR NERVE ULNAR (MISCELLANEOUS) ×8 IMPLANT
SCISSORS LAP 5X35 DISP (ENDOMECHANICALS) IMPLANT
SEAL CERVICAL OMNI LOK (ABLATOR) IMPLANT
SEAL ROD LENS SCOPE MYOSURE (ABLATOR) ×4 IMPLANT
SET IRRIG TUBING LAPAROSCOPIC (IRRIGATION / IRRIGATOR) ×4 IMPLANT
SET TUBE SMOKE EVAC HIGH FLOW (TUBING) ×4 IMPLANT
SOLUTION ELECTROLUBE (MISCELLANEOUS) IMPLANT
SUT MNCRL AB 4-0 PS2 18 (SUTURE) ×8 IMPLANT
SUT VICRYL 0 UR6 27IN ABS (SUTURE) ×4 IMPLANT
SYR 50ML LL SCALE MARK (SYRINGE) IMPLANT
SYS BAG RETRIEVAL 10MM (BASKET) ×3
SYS RETRIEVAL 5MM INZII UNIV (BASKET)
SYSTEM BAG RETRIEVAL 10MM (BASKET) IMPLANT
SYSTEM RETRIEVL 5MM INZII UNIV (BASKET) IMPLANT
SYSTEM TISS REMOVAL MYOSURE XL (MISCELLANEOUS) IMPLANT
TOWEL OR 17X26 10 PK STRL BLUE (TOWEL DISPOSABLE) ×8 IMPLANT
TRAY FOLEY W/BAG SLVR 14FR (SET/KITS/TRAYS/PACK) ×4 IMPLANT
TRENDGUARD 450 HYBRID PRO PACK (MISCELLANEOUS) ×4
TROCAR 12M 150ML BLUNT (TROCAR) IMPLANT
TROCAR BALLN 12MMX100 BLUNT (TROCAR) ×4 IMPLANT
TROCAR BLADELESS 15MM (ENDOMECHANICALS) IMPLANT
TROCAR BLADELESS OPT 12M 100M (ENDOMECHANICALS) IMPLANT
TROCAR BLADELESS OPT 5 100 (ENDOMECHANICALS) ×8 IMPLANT
TROCAR XCEL DIL TIP R 11M (ENDOMECHANICALS) IMPLANT
TROCAR XCEL NON-BLD 11X100MML (ENDOMECHANICALS) IMPLANT
WARMER LAPAROSCOPE (MISCELLANEOUS) ×4 IMPLANT

## 2019-10-08 NOTE — Transfer of Care (Addendum)
Immediate Anesthesia Transfer of Care Note  Patient: [name] Gibson  Procedure(s) Performed: LAPAROSCOPIC BILATERAL SALPINGO OOPHORECTOMY WITH PERITONEAL WASHINGS (Bilateral Abdomen) DILATATION & CURETTAGE/HYSTEROSCOPY WITH MYOSURE (N/A Vagina )  Patient Location: PACU  Anesthesia Type:General  Level of Consciousness: awake, alert , oriented and patient cooperative  Airway & Oxygen Therapy: Patient Spontanous Breathing and Patient connected to face mask oxygen  Post-op Assessment: Report given to RN and Post -op Vital signs reviewed and stable  Post vital signs: Reviewed and stable  Last Vitals:  Vitals Value Taken Time  BP    Temp    Pulse    Resp    SpO2      Last Pain:  Vitals:   10/08/19 0757  PainSc: 0-No pain      Patients Stated Pain Goal: 4 ([phone] [id number])  Complications: No apparent anesthesia complications

## 2019-10-08 NOTE — Anesthesia Preprocedure Evaluation (Addendum)
Anesthesia Evaluation  Patient identified by MRN, date of birth, ID band Patient awake    Reviewed: Allergy & Precautions, NPO status , Patient's Chart, lab work & pertinent test results  History of Anesthesia Complications (+) PONV and history of anesthetic complications  Airway Mallampati: II  TM Distance: >3 FB Neck ROM: Full    Dental no notable dental hx. (+) Teeth Intact, Dental Advisory Given   Pulmonary former smoker,    Pulmonary exam normal breath sounds clear to auscultation       Cardiovascular hypertension, Pt. on medications Normal cardiovascular exam Rhythm:Regular Rate:Normal  21/15/21 EKG ST   Neuro/Psych negative neurological ROS  negative psych ROS   GI/Hepatic negative GI ROS, Neg liver ROS,   Endo/Other  negative endocrine ROS  Renal/GU      Musculoskeletal  (+) Arthritis ,   Abdominal (+) + obese,   Peds  Hematology   Anesthesia Other Findings Hx of breast CA  Reproductive/Obstetrics                            Anesthesia Physical Anesthesia Plan  ASA: III  Anesthesia Plan: General   Post-op Pain Management:    Induction: Intravenous  PONV Risk Score and Plan: 4 or greater and Treatment may vary due to age or medical condition, Ondansetron, Dexamethasone, Midazolam, Scopolamine patch - Pre-op and TIVA  Airway Management Planned: Oral ETT  Additional Equipment: None  Intra-op Plan:   Post-operative Plan: Extubation in OR  Informed Consent: I have reviewed the patients History and Physical, chart, labs and discussed the procedure including the risks, benefits and alternatives for the proposed anesthesia with the patient or authorized representative who has indicated his/her understanding and acceptance.     Dental advisory given  Plan Discussed with: CRNA  Anesthesia Plan Comments: (GA w TIVA w dexmedetomidine 0.4-0.5 mcg/kg)       Anesthesia  Quick Evaluation

## 2019-10-08 NOTE — Anesthesia Procedure Notes (Signed)
Procedure Name: Intubation Date/Time: 10/08/2019 9:15 AM Performed by: Deliah Boston, CRNA Pre-anesthesia Checklist: Patient identified, Emergency Drugs available, Suction available and Patient being monitored Patient Re-evaluated:Patient Re-evaluated prior to induction Oxygen Delivery Method: Circle system utilized Preoxygenation: Pre-oxygenation with 100% oxygen Induction Type: IV induction Ventilation: Mask ventilation without difficulty and Oral airway inserted - appropriate to patient size Laryngoscope Size: Glidescope and 3 Grade View: Grade I Tube type: Oral Tube size: 7.5 mm Number of attempts: 3 Airway Equipment and Method: Stylet and Oral airway Placement Confirmation: ETT inserted through vocal cords under direct vision,  positive ETCO2 and breath sounds checked- equal and bilateral Secured at: 22 cm Tube secured with: Tape Dental Injury: Injury to tongue  Difficulty Due To: Difficulty was anticipated and Difficult Airway- due to anterior larynx Comments: Easy mask ventilation with OAW. CRNA DVL x 1 with mac 3, only epiglottis visualized, DVL X 1 by MDA with Miller 3,only epiglottis visualized,small nick to tongue notes DVL 1 by MDA with glidescope, grade 1 view, ETT advanced atraumatically, +BS equal and bilateral.

## 2019-10-08 NOTE — Discharge Instructions (Addendum)
Bilateral Salpingo-Oophorectomy, Care After This sheet gives you information about how to care for yourself after your procedure. Your health care provider may also give you more specific instructions. If you have problems or questions, contact your health care provider. What can I expect after the procedure? After the procedure, it is common to have:  Abdominal pain.  Some occasional vaginal bleeding (spotting).  Tiredness.  Symptoms of menopause, such as hot flashes, night sweats, or mood swings. Follow these instructions at home: Incision care   Keep your incision area and your bandage (dressing) clean and dry.  Follow instructions from your health care provider about how to take care of your incision. Make sure you: ? Wash your hands with soap and water before you change your dressing. If soap and water are not available, use hand sanitizer. ? Change your dressing as told by your health care provider. ? Leave stitches (sutures), staples, skin glue, or adhesive strips in place. These skin closures may need to stay in place for 2 weeks or longer. If adhesive strip edges start to loosen and curl up, you may trim the loose edges. Do not remove adhesive strips completely unless your health care provider tells you to do that.  Check your incision area every day for signs of infection. Check for: ? Redness, swelling, or pain. ? Fluid or blood. ? Warmth. ? Pus or a bad smell. Activity   Do not drive or use heavy machinery while taking prescription pain medicine.  Do not drive for 24 hours if you received a medicine to help you relax (sedative) during your procedure.  Take frequent, short walks throughout the day. Rest when you get tired. Ask your health care provider what activities are safe for you.  Avoid activity that requires great effort. Also, avoid heavy lifting. Do not lift anything that is heavier than 10 lbs. (4.5 kg), or the limit that your health care provider tells you,  until he or she says that it is safe to do so.  Do not douche, use tampons, or have sex until your health care provider approves. General instructions   To prevent or treat constipation while you are taking prescription pain medicine, your health care provider may recommend that you: ? Drink enough fluid to keep your urine clear or pale yellow. ? Take over-the-counter or prescription medicines. ? Eat foods that are high in fiber, such as fresh fruits and vegetables, whole grains, and beans. ? Limit foods that are high in fat and processed sugars, such as fried and sweet foods.  Take over-the-counter and prescription medicines only as told by your health care provider.  Do not take baths, swim, or use a hot tub until your health care provider approves. Ask your health care provider if you can take showers. You may only be allowed to take sponge baths for bathing.  Wear compression stockings as told by your health care provider. These stockings help to prevent blood clots and reduce swelling in your legs.  Keep all follow-up visits as told by your health care provider. This is important. Contact a health care provider if:  You have pain when you urinate.  You have pus or a bad smelling discharge coming from your vagina.  You have redness, swelling, or pain around your incision.  You have fluid or blood coming from your incision.  Your incision feels warm to the touch.  You have pus or a bad smell coming from your incision.  You have a fever.    Your incision starts to break open.  You have pain in the abdomen, and it gets worse or does not get better when you take medicine.  You develop a rash.  You develop nausea and vomiting.  You feel lightheaded. Get help right away if:  You develop pain in your chest or leg.  You become short of breath.  You faint.  You have increased bleeding from your vagina. Summary  After the procedure, it is common to have pain, bleeding  in the vagina, and symptoms of menopause.  Follow instructions from your health care provider about how to take care of your incision.  Follow instructions from your health care provider about activities and restrictions.  Check your incision every day for signs of infection and report any symptoms to your health care provider. This information is not intended to replace advice given to you by your health care provider. Make sure you discuss any questions you have with your health care provider. Document Revised: 09/13/2018 Document Reviewed: 08/14/2016 Elsevier Patient Education  2020 [employer]   Post Anesthesia Home Care Instructions  Activity: Get plenty of rest for the remainder of the day. A responsible adult should stay with you for 24 hours following the procedure.  For the next 24 hours, DO NOT: -Drive a car -Operate machinery -Drink alcoholic beverages -Take any medication unless instructed by your physician -Make any legal decisions or sign important papers.  Meals: Start with liquid foods such as gelatin or soup. Progress to regular foods as tolerated. Avoid greasy, spicy, heavy foods. If nausea and/or vomiting occur, drink only clear liquids until the nausea and/or vomiting subsides. Call your physician if vomiting continues.  Special Instructions/Symptoms: Your throat may feel dry or sore from the anesthesia or the breathing tube placed in your throat during surgery. If this causes discomfort, gargle with warm salt water. The discomfort should disappear within 24 hours.  If you had a scopolamine patch placed behind your ear for the management of post- operative nausea and/or vomiting:  1. The medication in the patch is effective for 72 hours, after which it should be removed.  Wrap patch in a tissue and discard in the trash. Wash hands thoroughly with soap and water. 2. You may remove the patch earlier than 72 hours if you experience unpleasant side effects which may  include dry mouth, dizziness or visual disturbances. 3. Avoid touching the patch. Wash your hands with soap and water after contact with the patch.    

## 2019-10-08 NOTE — H&P (Signed)
[name] Gibson is an 64 y.o. female.[id number]   RP:  LPS BSO, Peritoneal washings/HSC Myosure Excision, D+C  HPI: H/O Rt Breast Ca post Rt Mastectomy. Finished 10 yrs of Tamoxifen. BrCa 1-2 Negative. Persistent, increased in size simple Left Ovarian Cyst. Ca 125 normal at 17 on 07/01/2019. Probable Endometrial Polyp on Pelvic US 07/01/2019. No pelvic pain. No PMB.    Pertinent Gynecological History: Menses: post-menopausal Contraception: post menopausal status Blood transfusions: none Sexually transmitted diseases: no past history Last pap: normal    Menstrual History:  No LMP recorded. Patient is postmenopausal.    Past Medical History:  Diagnosis Date  . Arthritis   . Endometrial polyp   . Family history of brain cancer 02/05/2018  . Family history of breast cancer 02/05/2018  . Family history of melanoma 02/05/2018  . Heart murmur    10-06-2019  per pt no symptoms, found by pcp, has had echo 12-24-2007 in epic  . History of cancer chemotherapy    right breast completed 04-2008  . History of cellulitis    05/ 2014 and 01/ 2015  . History of cervical dysplasia    CIN III  s/p  cervical conization 1980s  . History of external beam radiation therapy    right breast  completed 06-2008  . History of pituitary adenoma    10-06-2019  per pt dx microadenoma 1990; MRI in 1995 adenoma had resolved  . Hypertension    followed by pcp---  (10-06-2019  pt had normal ETT 03-02-2014 in epic  . Lymphedema of right upper extremity    03-15-201 --- chronic due to hx breast mastectomy w/ node dissections 05/ 2009  . Malignant neoplasm of overlapping sites of right breast in female, estrogen receptor positive [facility]) oncologist--- dr Jana Hakim   dx 04/ 2009;  Stage IIB, multifocal/ multiple centric major component lobular w/ minor ductal component, Grade2 , ER/PR+, HER2 negative;   11-27-2007  s/p  right modified radial mastectomy with node dissections;  completed chemo 04-2008 and  radiation 06-2008  . Ovarian cyst   . PONV (postoperative nausea and vomiting)   . Psoriasis    10-06-2019 per pt currently right palm  . Wears glasses     Past Surgical History:  Procedure Laterality Date  . ANAL FISSURE REPAIR  1980s  . CERVICAL CONIZATION W/BX  1980s  . LAPAROSCOPIC CHOLECYSTECTOMY  09-07-2008  '@[facility]'   . MODIFIED RADICAL MASTECTOMY W/ AXILLARY LYMPH NODE DISSECTION Right 11-27-2007  dr Hassell Done '@[facility]'    11-28-2007  post op evacuation hematoma right mastectomy site  . TONSILLECTOMY  child    Family History  Problem Relation Age of Onset  . Breast cancer Cousin        40's, passed away form disease  . Breast cancer Cousin 27  . Hypertension Mother   . Melanoma Mother        bottom of foot  . Diabetes Father   . Hypertension Father   . Heart disease Father   . Parkinsonism Father   . COPD Brother   . Brain cancer Maternal Aunt        rare type of tumor  . Cancer Maternal Aunt        type unk  . Cancer Paternal Aunt        type unk    Social History:  reports that she quit smoking about 36 years ago. Her smoking use included cigarettes. She quit after 4.00 years of use. She has never used smokeless tobacco.  She reports that she does not drink alcohol or use drugs.  Allergies:  Allergies  Allergen Reactions  . Sulfa Antibiotics Hives    Medications Prior to Admission  Medication Sig Dispense Refill Last Dose  . Calcium Carbonate-Vitamin D (CALCIUM + D PO) Take 1 tablet by mouth 2 (two) times daily.    10/07/2019 at Unknown time  . escitalopram (LEXAPRO) 10 MG tablet Take 1 tablet (10 mg total) by mouth daily. (Patient taking differently: Take 10 mg by mouth at bedtime. ) 90 tablet 4 10/07/2019 at Unknown time  . glucosamine-chondroitin 500-400 MG tablet Take 1 tablet by mouth 2 (two) times daily.   10/07/2019 at Unknown time  . hydrochlorothiazide (HYDRODIURIL) 25 MG tablet Take 25 mg by mouth daily.    10/07/2019 at Unknown time  . lisinopril  (PRINIVIL,ZESTRIL) 40 MG tablet Take 40 mg by mouth at bedtime.    10/07/2019 at Unknown time  . potassium chloride SA (K-DUR,KLOR-CON) 20 MEQ tablet Take 20 mEq by mouth daily.   10/07/2019 at Unknown time  . zolpidem (AMBIEN) 5 MG tablet Take 5 mg by mouth at bedtime as needed.   10/07/2019 at Unknown time  . prochlorperazine (COMPAZINE) 10 MG tablet Take 1 tablet (10 mg total) by mouth every 6 (six) hours as needed for nausea or vomiting. 30 tablet 1 More than a month at Unknown time    REVIEW OF SYSTEMS: A ROS was performed and pertinent positives and negatives are included in the history.  GENERAL: No fevers or chills. HEENT: No change in vision, no earache, sore throat or sinus congestion. NECK: No pain or stiffness. CARDIOVASCULAR: No chest pain or pressure. No palpitations. PULMONARY: No shortness of breath, cough or wheeze. GASTROINTESTINAL: No abdominal pain, nausea, vomiting or diarrhea, melena or bright red blood per rectum. GENITOURINARY: No urinary frequency, urgency, hesitancy or dysuria. MUSCULOSKELETAL: No joint or muscle pain, no back pain, no recent trauma. DERMATOLOGIC: No rash, no itching, no lesions. ENDOCRINE: No polyuria, polydipsia, no heat or cold intolerance. No recent change in weight. HEMATOLOGICAL: No anemia or easy bruising or bleeding. NEUROLOGIC: No headache, seizures, numbness, tingling or weakness. PSYCHIATRIC: No depression, no loss of interest in normal activity or change in sleep pattern.     Blood pressure 131/80, temperature 97.6 F (36.4 C), resp. rate 18, height '5\' 6"'  (1.676 m), weight 109.3 kg, SpO2 100 %.  Physical Exam:  See office notes  Covid Negative Hb 15.3 on 10/06/2019  Pelvic US 07/01/2019: Anteverted uterus, normal size and shape, no myometrial masses. Endometrium with focal echogenic thickened area 5.2 mm, feeder vessel identified 3D imaging suggest 1.4 cm endometrial mass. Right ovary normal. Left adnexal avascular cystic mass enlarged from  previous scan (2017) to 6.5 x 5.3 x 3.7 cm. Unable to identify ovarian tissue separate from the mass. No free fluid.   Assessment/Plan:  64 y.o. G2P2002   1. Left ovarian cyst Left ovarian avascular cystic mass increased in size since 2017 now measured at 6.5 x 5.3 x 3.7 cm.  CA-125 normal at 17.  History of right breast cancer post right mastectomy, finished with tamoxifen for 10 years and BRCA 1 and 2 negative.  Decision to proceed with laparoscopic peritoneal washings and BSO. We will also do a hysteroscopy with excision of endometrial polyp and D&C. Surgery discussed including preop preparation, surgery with risks and postop precautions.  2. Endometrial polyp Focal echogenic thickening, probable endometrial polyp measuring 1.4 cm with a feeder vessel identified.  3. Malignant  neoplasm of overlapping sites of right breast in female, estrogen receptor positive ([place]) Right breast cancer status post right mastectomy.  Finished with tamoxifen treatment.  BRCA 1 and 2 negative.                        Patient was counseled as to the risk of surgery to include the following:  1. Infection (prohylactic antibiotics will be administered)  2. DVT/Pulmonary Embolism (prophylactic pneumo compression stockings will be used)  3.Trauma to internal organs requiring additional surgical procedure to repair any injury to internal organs requiring perhaps additional hospitalization days.  4.Hemmorhage requiring transfusion and blood products which carry risks such as  anaphylactic reaction, hepatitis and AIDS  Patient had received literature information on the procedure scheduled and all her questions were answered and fully accepts all risks.  Marie-Lyne Nikolai Wilczak 10/08/2019, 8:05 AM

## 2019-10-08 NOTE — Anesthesia Postprocedure Evaluation (Signed)
Anesthesia Post Note  Patient: [name] Gibson  Procedure(s) Performed: LAPAROSCOPIC BILATERAL SALPINGO OOPHORECTOMY WITH PERITONEAL WASHINGS (Bilateral Abdomen) DILATATION & CURETTAGE/HYSTEROSCOPY WITH MYOSURE (N/A Vagina )     Patient location during evaluation: PACU Anesthesia Type: General Level of consciousness: awake and alert Pain management: pain level controlled Vital Signs Assessment: post-procedure vital signs reviewed and stable Respiratory status: spontaneous breathing, nonlabored ventilation, respiratory function stable and patient connected to nasal cannula oxygen Cardiovascular status: blood pressure returned to baseline and stable Postop Assessment: no apparent nausea or vomiting Anesthetic complications: no    Last Vitals:  Vitals:   10/08/19 1118 10/08/19 1130  BP: (!) 114/50 114/76  Pulse:  84  Resp: 17 11  Temp: (!) 36.4 C   SpO2: 100% 100%    Last Pain:  Vitals:   10/08/19 1118  PainSc: 0-No pain                 [name]

## 2019-10-09 LAB — SURGICAL PATHOLOGY

## 2019-10-09 NOTE — Addendum Note (Signed)
Addendum  created 10/09/19 1107 by Justice Rocher, CRNA   Charge Capture section accepted

## 2019-10-10 LAB — CYTOLOGY - NON PAP

## 2019-10-12 NOTE — Op Note (Signed)
Operative Note  10/08/2019  9:41 AM  PATIENT:  [name] Gibson  64 y.o. female  PRE-OPERATIVE DIAGNOSIS:  Persistant, enlarged simple left ovarian cyst, endometrial polyp, history of right breast cancer  POST-OPERATIVE DIAGNOSIS:  Persistant, enlarged simple left ovarian cyst, endometrial polyps, history of right breast cancer  PROCEDURE:  Procedure(s): LAPAROSCOPIC BILATERAL SALPINGO OOPHORECTOMY WITH PERITONEAL WASHINGS DILATATION & CURETTAGE/HYSTEROSCOPY WITH MYOSURE EXCISION  SURGEON:  Surgeon(s): Princess Bruins, MD Joseph Pierini, MD  ANESTHESIA:   general  FINDINGS: Left Ovarian Cyst about 5 cm.  Right Ovary normal.  Normal tubes.  Endometrial polyps, one at the left fundus and one prolapsed in the endocervical canal.  DESCRIPTION OF OPERATION: Under general anesthesia with endotracheal intubation the patient is in lithotomy position. She is prepped with DuraPrep on the abdomen and with Betadine on the suprapubic, vulvar and vaginal areas. The Foley is inserted in the bladder. She is draped as usual. Timeout is done. The ZUMI is inserted in the uterus and the instruments used for placement are removed. Abdominally, we make an infraumbilical incision with a scalpel over 1.5 cm after infiltrating Marcaine one quarter plain. The aponeurosis is grasped with cokers and opened with Mayo scissors under direct vision. The parietal peritoneum is opened bluntly with the finger. A pursestring stitch of Vicryl 0 is done on the aponeurosis. The Sheryle Hail is inserted at that level and a pneumoperitoneum is created with CO2. The camera is inserted at that level. Inspection of the abdominopelvic cavities reveals a 5 cm left ovarian cyst of simple appearance. The right ovary is normal in size and appearance. Both tubes and the uterus are normal in size and appearance. No abdominal pathology is seen. Patient is status post cholecystectomy. We infiltrated Marcaine one quarter plain at the right and left  lower abdomen, small incisions are made at those levels and 5 mm ports are inserted under direct vision on both sides. The ureters are normal anatomy position with good peristalsis bilaterally. We start with peritoneal washings. Adhesions are lysed on the left side to expose the left infundibulopelvic ligament better. The LigaSure instrument was used for the procedure. We cauterized and sectioned the left infundibulopelvic ligament. We then cauterized and sectioned just on the the left ovary, we then cauterized and sectioned the left utero-ovarian ligament. We cauterized and sectioned the left tube close to the uterus at the cornua. The specimen is put in the posterior cul-de-sac. We then proceeded with the right salpingo-oophorectomy. We cauterized and sectioned the right infundibulopelvic ligament. We cauterized and sectioned under the right ovary. We cauterized and sectioned the right utero-ovarian ligament and finished with cauterization and section of the right tube at the cornua. That specimen was put in the posterior cul-de-sac as well. We then switched to the 5 mm camera through the right port. The Endobag was inserted at the infraumbilical port. Both specimens were put in the bag and the bag was brought to the skin. The cyst was ruptured and suction within the bag and the specimen was removed from the abdomen in the bag and sent to pathology. Inspection of the pelvis reveal good hemostasis at all levels. Irrigation and suction was done. All instruments were removed. The ports were removed under direct vision and the CO2 was evacuated. The pursestring stitch was attached at the infraumbilical incision. All incisions were closed with Monocryl 4-0 in a subcuticular stitch. Dermabond was added on the 3 incisions. We then went to the vaginal part of the procedure.  The ZUMI was removed. The speculum was inserted in the vagina and the anterior lip of the cervix was grasped with a tenaculum. Dilation of the  cervix with a 21 Pratt dilator was confirmed. The hysteroscope was inserted in the intrauterine cavity. Both ostia were well visualized. A 1.5 cm polyp was present in the cavity towards the left fundus. Another larger polyp was prolapsed in the cervical canal. The reach MyoSure was put in place. Excision of both polyps completely. The intra uterine cavity appeared normal after excision. Hemostasis was adequate. Pictures were taken before and after the excision. The hysteroscope with MyoSure were removed. We proceeded with a systematic curettage of the intra uterine cavity with a sharp curette. Both specimens were sent together to pathology. All instruments were removed. Hemostasis was good. The patient was brought to recovery room in good and stable status.   ESTIMATED BLOOD LOSS: 20 mL  No intake or output data in the 24 hours ending 10/12/19 0941   BLOOD ADMINISTERED:none   LOCAL MEDICATIONS USED:  MARCAINE     SPECIMEN:  Source of Specimen:  Bilateral Ovaries and tubes.  Peritoneal washings.  Endometrial polyps, excision material and Endometrial Curettings.  DISPOSITION OF SPECIMEN:  PATHOLOGY  COUNTS:  YES  PLAN OF CARE: Transfer to PACU  Marie-Lyne LavoieMD9:41 AM

## 2019-10-13 NOTE — Telephone Encounter (Signed)
Spoke with patient and informed her. °

## 2019-10-15 MED FILL — ESCITALOPRAM 10 MG TABLET: 10 | 90 days supply | Qty: 90 | Fill #0

## 2019-10-16 MED FILL — ZOLPIDEM TARTRATE 10 MG TAB: 10 | 90 days supply | Qty: 90 | Fill #0

## 2019-10-27 ENCOUNTER — Other Ambulatory Visit: Payer: Self-pay

## 2019-10-27 ENCOUNTER — Ambulatory Visit (INDEPENDENT_AMBULATORY_CARE_PROVIDER_SITE_OTHER): Payer: 59 | Admitting: Obstetrics & Gynecology

## 2019-10-27 ENCOUNTER — Encounter: Payer: Self-pay | Admitting: Obstetrics & Gynecology

## 2019-10-27 VITALS — BP 132/84

## 2019-10-27 DIAGNOSIS — Z09 Encounter for follow-up examination after completed treatment for conditions other than malignant neoplasm: Secondary | ICD-10-CM

## 2019-10-27 NOTE — Patient Instructions (Signed)
1. Status post gynecological surgery, follow-up exam Very good postop healing without complication.  Pathology benign and reviewed with patient.  Follow-up at annual gynecologic exam.  [name] Gibson, it was a pleasure seeing you today!

## 2019-10-27 NOTE — Progress Notes (Signed)
    [name] Gibson 08/22/1955 [id number]        64 y.o.  G2P2002   RP: Postop LPS BSO and HSC/Excision of Polyp with D+C on 10/08/2019  HPI: Very good postop healing.  Incisions closed with no pain.  No vaginal bleeding or vaginal discharge.  No pelvic pain.  Urine and bowel movements normal.  No fever.   OB History  Gravida Para Term Preterm AB Living  2 2 2     2   SAB TAB Ectopic Multiple Live Births               # Outcome Date GA Lbr Len/2nd Weight Sex Delivery Anes PTL Lv  2 Term           1 Term             Past medical history,surgical history, problem list, medications, allergies, family history and social history were all reviewed and documented in the EPIC chart.   Directed ROS with pertinent positives and negatives documented in the history of present illness/assessment and plan.  Exam:  Vitals:   10/27/19 1222  BP: 132/84   General appearance:  Normal  Abdomen: Normal.  Incisions well healed, closed, no erythema, no drainage.  Gynecologic exam: Vulva normal.  Bimanual exam:  Uterus AV, mobile, normal volume, NT.  No adnexal mass, NT.  Patho:  FINAL MICROSCOPIC DIAGNOSIS:   A. OVARIES AND FALLOPIAN TUBES, BILATERAL SALPINGO-OOPHORECTOMY:  - Benign simple cyst with attenuated epithelial lining (5.2 cm; left)  - Benign ovaries  - Benign fallopian tubes  - No malignancy identified   B. ENDOMETRIUM AND ENDOCERVIX, CURETTINGS:  - Benign endometrial-type polyp  - Benign squamous and endocervical glandular epithelium  - No hyperplasia or malignancy identified    Assessment/Plan:  64 y.o. G2P2002   1. Status post gynecological surgery, follow-up exam Very good postop healing without complication.  Pathology benign and reviewed with patient.  Follow-up at annual gynecologic exam.  Princess Bruins MD, 12:50 PM 10/27/2019

## 2019-10-30 MED FILL — HYDROCHLOROTHIAZIDE 25 MG T: 25 | 90 days supply | Qty: 90 | Fill #0

## 2019-11-07 MED FILL — POTASSIUM CHLORIDE CRYS ER: 20 | 90 days supply | Qty: 90 | Fill #0

## 2020-01-05 MED FILL — LISINOPRIL 40 MG TABS: 40 | 90 days supply | Qty: 90 | Fill #1

## 2020-01-08 ENCOUNTER — Telehealth: Payer: Self-pay | Admitting: *Deleted

## 2020-01-08 NOTE — Telephone Encounter (Signed)
This RN faxed last office dictation from 01/11/2020 to [facility] per request for compression garments per faxed request.

## 2020-01-15 MED FILL — ESCITALOPRAM 10 MG TABLET: 10 | 90 days supply | Qty: 90 | Fill #1

## 2020-01-15 MED FILL — ZOLPIDEM TARTRATE 10 MG TAB: 10 | 90 days supply | Qty: 90 | Fill #0

## 2020-01-28 ENCOUNTER — Other Ambulatory Visit: Payer: Self-pay

## 2020-01-28 ENCOUNTER — Inpatient Hospital Stay: Payer: 59 | Attending: Oncology

## 2020-01-28 DIAGNOSIS — Z87891 Personal history of nicotine dependence: Secondary | ICD-10-CM | POA: Insufficient documentation

## 2020-01-28 DIAGNOSIS — Z882 Allergy status to sulfonamides status: Secondary | ICD-10-CM | POA: Diagnosis not present

## 2020-01-28 DIAGNOSIS — I890 Lymphedema, not elsewhere classified: Secondary | ICD-10-CM | POA: Diagnosis not present

## 2020-01-28 DIAGNOSIS — M1990 Unspecified osteoarthritis, unspecified site: Secondary | ICD-10-CM | POA: Insufficient documentation

## 2020-01-28 DIAGNOSIS — Z808 Family history of malignant neoplasm of other organs or systems: Secondary | ICD-10-CM | POA: Diagnosis not present

## 2020-01-28 DIAGNOSIS — Z90722 Acquired absence of ovaries, bilateral: Secondary | ICD-10-CM | POA: Diagnosis not present

## 2020-01-28 DIAGNOSIS — Z853 Personal history of malignant neoplasm of breast: Secondary | ICD-10-CM | POA: Diagnosis not present

## 2020-01-28 DIAGNOSIS — Z79810 Long term (current) use of selective estrogen receptor modulators (SERMs): Secondary | ICD-10-CM | POA: Insufficient documentation

## 2020-01-28 DIAGNOSIS — Z836 Family history of other diseases of the respiratory system: Secondary | ICD-10-CM | POA: Insufficient documentation

## 2020-01-28 DIAGNOSIS — Z79899 Other long term (current) drug therapy: Secondary | ICD-10-CM | POA: Diagnosis not present

## 2020-01-28 DIAGNOSIS — Z6836 Body mass index (BMI) 36.0-36.9, adult: Secondary | ICD-10-CM | POA: Insufficient documentation

## 2020-01-28 DIAGNOSIS — Z923 Personal history of irradiation: Secondary | ICD-10-CM | POA: Diagnosis not present

## 2020-01-28 DIAGNOSIS — Z803 Family history of malignant neoplasm of breast: Secondary | ICD-10-CM | POA: Insufficient documentation

## 2020-01-28 DIAGNOSIS — Z8249 Family history of ischemic heart disease and other diseases of the circulatory system: Secondary | ICD-10-CM | POA: Diagnosis not present

## 2020-01-28 DIAGNOSIS — Z818 Family history of other mental and behavioral disorders: Secondary | ICD-10-CM | POA: Insufficient documentation

## 2020-01-28 DIAGNOSIS — Z833 Family history of diabetes mellitus: Secondary | ICD-10-CM | POA: Insufficient documentation

## 2020-01-28 DIAGNOSIS — Z170 Estrogen receptor positive status [ER+]: Secondary | ICD-10-CM

## 2020-01-28 LAB — CBC WITH DIFFERENTIAL/PLATELET
Abs Immature Granulocytes: 0.03 10*3/uL (ref 0.00–0.07)
Basophils Absolute: 0.1 10*3/uL (ref 0.0–0.1)
Basophils Relative: 1 %
Eosinophils Absolute: 0.3 10*3/uL (ref 0.0–0.5)
Eosinophils Relative: 3 %
HCT: 45.2 % (ref 36.0–46.0)
Hemoglobin: 15.7 g/dL — ABNORMAL HIGH (ref 12.0–15.0)
Immature Granulocytes: 0 %
Lymphocytes Relative: 30 %
Lymphs Abs: 2.9 10*3/uL (ref 0.7–4.0)
MCH: 32 pg (ref 26.0–34.0)
MCHC: 34.7 g/dL (ref 30.0–36.0)
MCV: 92.1 fL (ref 80.0–100.0)
Monocytes Absolute: 0.7 10*3/uL (ref 0.1–1.0)
Monocytes Relative: 7 %
Neutro Abs: 5.7 10*3/uL (ref 1.7–7.7)
Neutrophils Relative %: 59 %
Platelets: 316 10*3/uL (ref 150–400)
RBC: 4.91 MIL/uL (ref 3.87–5.11)
RDW: 12.5 % (ref 11.5–15.5)
WBC: 9.6 10*3/uL (ref 4.0–10.5)
nRBC: 0 % (ref 0.0–0.2)

## 2020-01-28 LAB — COMPREHENSIVE METABOLIC PANEL
ALT: 37 U/L (ref 0–44)
AST: 22 U/L (ref 15–41)
Albumin: 4 g/dL (ref 3.5–5.0)
Alkaline Phosphatase: 96 U/L (ref 38–126)
Anion gap: 12 (ref 5–15)
BUN: 12 mg/dL (ref 8–23)
CO2: 28 mmol/L (ref 22–32)
Calcium: 9.8 mg/dL (ref 8.9–10.3)
Chloride: 102 mmol/L (ref 98–111)
Creatinine, Ser: 0.87 mg/dL (ref 0.44–1.00)
GFR calc Af Amer: >60 mL/min (ref >60)
GFR calc non Af Amer: >60 mL/min (ref >60)
Glucose, Bld: 114 mg/dL — ABNORMAL HIGH (ref 70–99)
Potassium: 4.4 mmol/L (ref 3.5–5.1)
Sodium: 142 mmol/L (ref 135–145)
Total Bilirubin: 0.4 mg/dL (ref 0.3–1.2)
Total Protein: 7.2 g/dL (ref 6.5–8.1)

## 2020-01-29 LAB — CANCER ANTIGEN 27.29: CA 27.29: 27.5 U/mL (ref 0.0–38.6)

## 2020-02-02 MED FILL — POTASSIUM CHLORIDE CRYS ER: 20 | 90 days supply | Qty: 90 | Fill #1

## 2020-02-02 MED FILL — HYDROCHLOROTHIAZIDE 25 MG T: 25 | 90 days supply | Qty: 90 | Fill #1

## 2020-02-03 DIAGNOSIS — Z1231 Encounter for screening mammogram for malignant neoplasm of breast: Secondary | ICD-10-CM | POA: Diagnosis not present

## 2020-02-03 NOTE — Progress Notes (Signed)
ID: [name] Gibson   DOB: 02/28/1956  MR#: [id number]  CSN#:[id number]  Patient Care Team: White, Cynthia, MD as PCP - General (Family Medicine) Magrinat, Gustav C, MD as Consulting Physician (Oncology) Young, Nancy J, NP as Consulting Physician (Obstetrics and Gynecology) Mann, Jyothi, MD as Consulting Physician (Gastroenterology) OTHER MD:  CHIEF COMPLAINT:  Estrogen receptor positive breast cancer  CURRENT TREATMENT: Observation   INTERVAL HISTORY: [name] returns today for follow-up of her estrogen receptor positive breast cancer.   She continues to do well and has no concerns.  She has right arm lymphedema from her cancer treatment and needs her sleeve re-ordered as hers is worn and due to be replaced.  She also underwent bilateral diagnostic mammography with tomography at [facility] on 02/03/2020, that is not yet scanned, but shows no evidence of malignancy and breast density category B.  She also underwent bone density testing with Dr. Lavoie in 06/2019 that was normal.  Repeat was recommended in 5 years.    Since her last visit she underwent BSO on 10/08/2019 due to an ovarian cyst.  Pathology was benign.     REVIEW OF SYSTEMS: [name] Gibson is seeing her PCP regularly.  She is up to date with her pap smears, colon cancer screening and is planning to get her skin cancer screening scheduled.  She is on a clinical trial with an injectable medication for weight loss, along with exercise.  She has lost about 20 pounds since her last visit.  She is eating well, and is feeling very well.  She is getting at least 150 min/exercise per week.    Keyerra has no other issues and a detailed ROS was otherwise non contributory.    HISTORY OF PRESENT ILLNESS: From the original intake note:  She had her annual screening mammogram on 10-24-07.  This showed a possible area of asymmetry in the right breast and she returned on 10-29-07 for diagnostic mammography and right breast ultrasound.  In the lower inner right  breast, there was an area of architectural distortion which by ultrasound measured 2.9 cm.  It was ill-defined and hypoechoic.  In addition, in a different quadrant, there was an ill-defined hypoechoic mass up to 8 mm.  Breast specific gamma imaging was obtained on the same day and indeed showed two areas of abnormal uptake as already described.  The one inferiorly measured 2.2 cm. and the one superiorly 7 mm by breast specific gamma imaging.  The patient underwent ultrasound-guided biopsy of both right breast lesions on 11-04-07.  The final pathology showed:  1. An invasive lobular carcinoma (E-cadherin negative). 2. An invasive ductal carcinoma with some evidence of ductal carcinoma in situ.    Her subsequent history is as detailed below   PAST MEDICAL HISTORY: Past Medical History:  Diagnosis Date  . Arthritis   . Endometrial polyp   . Family history of brain cancer 02/05/2018  . Family history of breast cancer 02/05/2018  . Family history of melanoma 02/05/2018  . Heart murmur    10-06-2019  per pt no symptoms, found by pcp, has had echo 12-24-2007 in epic  . History of cancer chemotherapy    right breast completed 04-2008  . History of cellulitis    05/ 2014 and 01/ 2015  . History of cervical dysplasia    CIN III  s/p  cervical conization 1980s  . History of external beam radiation therapy    right breast  completed 06-2008  . History of pituitary adenoma      10-06-2019  per pt dx microadenoma 1990; MRI in 1995 adenoma had resolved  . Hypertension    followed by pcp---  (10-06-2019  pt had normal ETT 03-02-2014 in epic  . Lymphedema of right upper extremity    03-15-201 --- chronic due to hx breast mastectomy w/ node dissections 05/ 2009  . Malignant neoplasm of overlapping sites of right breast in female, estrogen receptor positive [facility]) oncologist--- dr Jana Hakim   dx 04/ 2009;  Stage IIB, multifocal/ multiple centric major component lobular w/ minor ductal component, Grade2 ,  ER/PR+, HER2 negative;   11-27-2007  s/p  right modified radial mastectomy with node dissections;  completed chemo 04-2008 and radiation 06-2008  . Ovarian cyst   . PONV (postoperative nausea and vomiting)   . Psoriasis    10-06-2019 per pt currently right palm  . Wears glasses   History of a possible mitral valve prolapse murmur (the patient currently does not take prophylactic antibiotics before dental procedures); history of Fen-Phen use; history of minimal urinary incontinence.   PAST SURGICAL HISTORY: Past Surgical History:  Procedure Laterality Date  . ANAL FISSURE REPAIR  1980s  . CERVICAL CONIZATION W/BX  1980s  . DILATATION & CURETTAGE/HYSTEROSCOPY WITH MYOSURE N/A 10/08/2019   Procedure: DILATATION & CURETTAGE/HYSTEROSCOPY WITH MYOSURE;  Surgeon: Princess Bruins, MD;  Location: [place];  Service: Gynecology;  Laterality: N/A;  . LAPAROSCOPIC BILATERAL SALPINGO OOPHERECTOMY Bilateral 10/08/2019   Procedure: LAPAROSCOPIC BILATERAL SALPINGO OOPHORECTOMY WITH PERITONEAL WASHINGS;  Surgeon: Princess Bruins, MD;  Location: [place];  Service: Gynecology;  Laterality: Bilateral;  request 9:00 OR time in [facility] Gynecology block time requests two hours  . LAPAROSCOPIC CHOLECYSTECTOMY  09-07-2008  _0   . MODIFIED RADICAL MASTECTOMY W/ AXILLARY LYMPH NODE DISSECTION Right 11-27-2007  dr Hassell Done _1    11-28-2007  post op evacuation hematoma right mastectomy site  . TONSILLECTOMY  child    FAMILY HISTORY Family History  Problem Relation Age of Onset  . Breast cancer Cousin        40's, passed away form disease  . Breast cancer Cousin 2  . Hypertension Mother   . Melanoma Mother        bottom of foot  . Diabetes Father   . Hypertension Father   . Heart disease Father   . Parkinsonism Father   . COPD Brother   . Brain cancer Maternal Aunt        rare type of tumor  . Cancer Maternal Aunt        type unk  . Cancer Paternal Aunt         type unk  The patient's father died at the age of 26 in the setting of Parkinson's disease and multiple other medical problems.  The patient's mother is alive at 20.  She has a history of foot melanoma.  The patient has one brother and one sister.  There is no breast cancer or ovarian cancer in the immediate family.  In the patient's mother's father's family, however, two daughters of one sister have been diagnosed with cancer, one at the age of 46 who unfortunately succumbed and one at the age of 81.  So this would be two first cousins out of a large number of cousins (recall that the patient's father was one of 12 siblings).   GYNECOLOGIC HISTORY:  (Updated 12/24/2013) She is GX P2, first pregnancy to term age 14. Last menstrual period prior to chemo in 2009.  She has occasional hot  flashes.     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  Her husband, Johnny, is a [employer] minister but he is partially disabled with MS, and he is currently doing some mediation.  They have a 24-year-old daughter, who got married in 04/2018, and a 21-year-old son, who is studying at [employer].   ADVANCED DIRECTIVES: not in place   HEALTH MAINTENANCE: Social History   Tobacco Use  . Smoking status: Former Smoker    Years: 4.00    Types: Cigarettes    Quit date: 10/06/1983    Years since quitting: 36.3  . Smokeless tobacco: Never Used  Vaping Use  . Vaping Use: Never used  Substance Use Topics  . Alcohol use: No  . Drug use: Never     Colonoscopy:    Not on file   PAP: April 2015   Bone density:  April 2013 at [facility], normal   Lipid panel: not on file/Dr. White   Allergies  Allergen Reactions  . Sulfa Antibiotics Hives    Current Outpatient Medications  Medication Sig Dispense Refill  . Calcium Carbonate-Vitamin D (CALCIUM + D PO) Take 1 tablet by mouth 2 (two) times daily.     .  escitalopram (LEXAPRO) 10 MG tablet Take 1 tablet (10 mg total) by mouth daily. (Patient taking differently: Take 10 mg by mouth at bedtime. ) 90 tablet 4  . glucosamine-chondroitin 500-400 MG tablet Take 1 tablet by mouth 2 (two) times daily.    . hydrochlorothiazide (HYDRODIURIL) 25 MG tablet Take 25 mg by mouth daily.     . lisinopril (PRINIVIL,ZESTRIL) 40 MG tablet Take 40 mg by mouth at bedtime.     . NON FORMULARY Tirzepatide 10 mg (clinical trial drug) 1 injection weekly.    . potassium chloride SA (K-DUR,KLOR-CON) 20 MEQ tablet Take 20 mEq by mouth daily.    . zolpidem (AMBIEN) 5 MG tablet Take 5 mg by mouth at bedtime as needed.    . prochlorperazine (COMPAZINE) 10 MG tablet Take 1 tablet (10 mg total) by mouth every 6 (six) hours as needed for nausea or vomiting. 30 tablet 1   No current facility-administered medications for this visit.    OBJECTIVE: Morbidly obese white woman in no acute distress Vitals:   02/04/20 1001  BP: (!) 122/52  Pulse: 78  Resp: 19  Temp: 98.2 F (36.8 C)  SpO2: 100%     Body mass index is 36.03 kg/m.    ECOG FS: 1 Filed Weights   02/04/20 1001  Weight: 223 lb 3.2 oz (101.2 kg)   GENERAL: Patient is a well appearing female in no acute distress HEENT:  Sclerae anicteric.  Mask in place. Neck is supple.  NODES:  No cervical, supraclavicular, or axillary lymphadenopathy palpated.  BREAST EXAM:  Right breast s/p mastectomy and radiation therapy, talengectasia noted, but no sign of local recurrence, left breast is benign. LUNGS:  Clear to auscultation bilaterally.  No wheezes or rhonchi. HEART:  Regular rate and rhythm. No murmur appreciated. ABDOMEN:  Soft, nontender.  Positive, normoactive bowel sounds. No organomegaly palpated. MSK:  No focal spinal tenderness to palpation. Full range of motion bilaterally in the upper extremities. EXTREMITIES:  No peripheral edema.   SKIN:  Clear with no obvious rashes or skin changes. No nail dyscrasia. NEURO:   Nonfocal. Well oriented.  Appropriate affect.     LAB RESULTS: Lab Results    Component Value Date   WBC 9.6 01/28/2020   NEUTROABS 5.7 01/28/2020   HGB 15.7 (H) 01/28/2020   HCT 45.2 01/28/2020   MCV 92.1 01/28/2020   PLT 316 01/28/2020      Chemistry      Component Value Date/Time   NA 142 01/28/2020 1057   NA 141 12/11/2016 1603   K 4.4 01/28/2020 1057   K 3.5 12/11/2016 1603   CL 102 01/28/2020 1057   CL 103 12/20/2012 1054   CO2 28 01/28/2020 1057   CO2 28 12/11/2016 1603   BUN 12 01/28/2020 1057   BUN 14.1 12/11/2016 1603   CREATININE 0.87 01/28/2020 1057   CREATININE 0.71 12/26/2018 1523   CREATININE 0.8 12/11/2016 1603      Component Value Date/Time   CALCIUM 9.8 01/28/2020 1057   CALCIUM 8.8 12/11/2016 1603   ALKPHOS 96 01/28/2020 1057   ALKPHOS 89 12/11/2016 1603   AST 22 01/28/2020 1057   AST 17 12/26/2018 1523   AST 16 12/11/2016 1603   ALT 37 01/28/2020 1057   ALT 25 12/26/2018 1523   ALT 16 12/11/2016 1603   BILITOT 0.4 01/28/2020 1057   BILITOT 0.3 12/26/2018 1523   BILITOT <0.22 12/11/2016 1603       Lab Results  Component Value Date   LABCA2 22 12/13/2015     STUDIES: No results found.  ASSESSMENT: 64 y.o.  [place] woman   (1) status post right modified radical mastectomy in May 2009 for a multifocal/multicentric breast carcinoma, the major component being lobular with a minor ductal component, pathologically T2 N1, Stage IIB, grade 2, estrogen and progesterone receptor positive, HER2 negative, with a low MIB-1,  (2) treated adjuvantly with dose-dense doxorubicin/ cyclophosphamide x4 followed by weekly paclitaxel x12, completed October 2009,   (3) radiation completed on December 2009   (4) started tamoxifen December 2009.  the goal is to continue on tamoxifen for a total of 10 years, until December 2019.  (4)  lymphedema, right upper extremity   (5) genetics testing 02/04/2018  (a) Results: Negative, No pathogenic variants  identified.  The date of this test report is 02/13/2018. The Multi-Cancer Panel offered by [facility] includes sequencing and/or deletion duplication testing of the following 79 genes:AIP, ALK, APC, ATM, AXIN2,BAP1,  BARD1, BLM, BMPR1A, BRCA1, BRCA2, BRIP1, CASR, CDC73, CDH1, CDK4, CDKN1B, CDKN1C, CDKN2A (p14ARF), CDKN2A (p16INK4a), CEBPA, CHEK2, CTNNA1, DICER1, DIS3L2, EGFR (c.2369C>T, p.Thr790Met variant only), EPCAM (Deletion/duplication testing only), FH, FLCN, GATA2, GPC3, GREM1 (Promoter region deletion/duplication testing only), HOXB13 (c.251G>A, p.Gly84Glu), HRAS, KIT, MAX, MEN1, MET, MITF (c.952G>A, p.Glu318Lys variant only), MLH1, MSH2, MSH3, MSH6, MUTYH, NBN, NF1, NF2, NTHL1, PALB2, PDGFRA, PHOX2B, PMS2, POLD1, POLE, POT1, PRKAR1A, PTCH1, PTEN, RAD50, RAD51C, RAD51D, RB1, RECQL4, RET, RUNX1, SDHAF2, SDHA (sequence changes only), SDHB, SDHC, SDHD, SMAD4, SMARCA4, SMARCB1, SMARCE1, STK11, SUFU, TERC, TERT, TMEM127, TP53, TSC1, TSC2, VHL, WRN and WT1.      PLAN:  David is doing well today.  She has no clinical or radiographic sign of breast cancer recurrence.  This is very favorable.  She had labs drawn on 01/28/2020 and those were normal.    Janelle has lost weight, and is feeling great.  I reviewed with her how proud we are of her weight loss, and encouraged her to continue on her journey.    Juliann Pulse and I reviewed our recommendations for healthy living to reduce her risk of cancer recurrence/and to monitor for long term complications/adverse effects from treatment.  She was recommended to continue  f/u with her PCP annually, and to continue with her cancer screenings for breast, GYN, colon, skin cancers.  She was recommended to continue healthy diet and exercise.    Her bone density is normal.  We reviewed bone health and she will continue f/u with Dr. Dellis Filbert for her bone density testing.  We will see reena in one year for labs and f/u in [place].  She knows to call for any questions that  may arise between now and her next appointment.  We are happy to see her sooner if needed.   Total encounter time: 20 minutes*   Wilber Bihari, NP 02/04/20 1:36 PM Medical Oncology and Hematology [facility] [place], [place] [address] Tel. [phone]    Fax. [phone]  *Total Encounter Time as defined by the Centers for Medicare and Medicaid Services includes, in addition to the face-to-face time of a patient visit (documented in the note above) non-face-to-face time: obtaining and reviewing outside history, ordering and reviewing medications, tests or procedures, care coordination (communications with other health care professionals or caregivers) and documentation in the medical record.

## 2020-02-04 ENCOUNTER — Encounter: Payer: Self-pay | Admitting: Adult Health

## 2020-02-04 ENCOUNTER — Other Ambulatory Visit: Payer: Self-pay

## 2020-02-04 ENCOUNTER — Inpatient Hospital Stay: Payer: 59 | Admitting: Adult Health

## 2020-02-04 ENCOUNTER — Telehealth: Payer: Self-pay

## 2020-02-04 ENCOUNTER — Other Ambulatory Visit: Payer: Self-pay | Admitting: Oncology

## 2020-02-04 VITALS — BP 122/52 | HR 78 | Temp 98.2°F | Resp 19 | Ht 66.0 in | Wt 223.2 lb

## 2020-02-04 DIAGNOSIS — Z923 Personal history of irradiation: Secondary | ICD-10-CM | POA: Diagnosis not present

## 2020-02-04 DIAGNOSIS — Z79899 Other long term (current) drug therapy: Secondary | ICD-10-CM | POA: Diagnosis not present

## 2020-02-04 DIAGNOSIS — Z882 Allergy status to sulfonamides status: Secondary | ICD-10-CM | POA: Diagnosis not present

## 2020-02-04 DIAGNOSIS — Z6836 Body mass index (BMI) 36.0-36.9, adult: Secondary | ICD-10-CM | POA: Diagnosis not present

## 2020-02-04 DIAGNOSIS — Z79810 Long term (current) use of selective estrogen receptor modulators (SERMs): Secondary | ICD-10-CM | POA: Diagnosis not present

## 2020-02-04 DIAGNOSIS — E559 Vitamin D deficiency, unspecified: Secondary | ICD-10-CM | POA: Diagnosis not present

## 2020-02-04 DIAGNOSIS — Z170 Estrogen receptor positive status [ER+]: Secondary | ICD-10-CM

## 2020-02-04 DIAGNOSIS — Z87891 Personal history of nicotine dependence: Secondary | ICD-10-CM | POA: Diagnosis not present

## 2020-02-04 DIAGNOSIS — C50911 Malignant neoplasm of unspecified site of right female breast: Secondary | ICD-10-CM | POA: Diagnosis not present

## 2020-02-04 DIAGNOSIS — M1990 Unspecified osteoarthritis, unspecified site: Secondary | ICD-10-CM | POA: Diagnosis not present

## 2020-02-04 DIAGNOSIS — I890 Lymphedema, not elsewhere classified: Secondary | ICD-10-CM | POA: Diagnosis not present

## 2020-02-04 DIAGNOSIS — Z853 Personal history of malignant neoplasm of breast: Secondary | ICD-10-CM | POA: Diagnosis not present

## 2020-02-04 MED ORDER — PROCHLORPERAZINE MALEATE 10 MG PO TABS: 10.0000 mg | ORAL_TABLET | Freq: Four times a day (QID) | ORAL | 1 refills | Status: DC | PRN

## 2020-02-04 MED FILL — PROCHLORPERAZINE 10 MG TAB: 10 | 8 days supply | Qty: 30 | Fill #0

## 2020-02-04 NOTE — Telephone Encounter (Signed)
This LPN called [facility] to inquire about compression device for pt that was faxed per 01/08/20 phone note. Kathlee Nations at [facility] states order was fulfilled 01/29/20 and shipped out to pt. Kathlee Nations states she will email manufacturer to retrieve tracking data and email pt with tracking. This LPN called pt to inform her of above. Pt verbalized thanks and understanding.

## 2020-02-06 DIAGNOSIS — I890 Lymphedema, not elsewhere classified: Secondary | ICD-10-CM | POA: Diagnosis not present

## 2020-02-06 DIAGNOSIS — C50911 Malignant neoplasm of unspecified site of right female breast: Secondary | ICD-10-CM | POA: Diagnosis not present

## 2020-02-06 DIAGNOSIS — Z9011 Acquired absence of right breast and nipple: Secondary | ICD-10-CM | POA: Diagnosis not present

## 2020-02-24 ENCOUNTER — Other Ambulatory Visit (HOSPITAL_COMMUNITY): Payer: Self-pay | Admitting: Family Medicine

## 2020-02-24 DIAGNOSIS — I10 Essential (primary) hypertension: Secondary | ICD-10-CM | POA: Diagnosis not present

## 2020-02-24 DIAGNOSIS — F419 Anxiety disorder, unspecified: Secondary | ICD-10-CM | POA: Diagnosis not present

## 2020-02-24 DIAGNOSIS — F325 Major depressive disorder, single episode, in full remission: Secondary | ICD-10-CM | POA: Diagnosis not present

## 2020-02-24 MED FILL — LISINOPRIL 10 MG TABS: 10 | 90 days supply | Qty: 90 | Fill #0

## 2020-04-13 MED FILL — ESCITALOPRAM 10 MG TABLET: 10 | 90 days supply | Qty: 90 | Fill #0

## 2020-04-23 DIAGNOSIS — C50911 Malignant neoplasm of unspecified site of right female breast: Secondary | ICD-10-CM | POA: Diagnosis not present

## 2020-04-26 MED FILL — ZOLPIDEM TARTRATE 10 MG TAB: 10 | 90 days supply | Qty: 90 | Fill #0

## 2020-04-26 MED FILL — POTASSIUM CHLORIDE CRYS ER: 20 | 90 days supply | Qty: 90 | Fill #0

## 2020-04-26 MED FILL — HYDROCHLOROTHIAZIDE 25 MG T: 25 | 90 days supply | Qty: 90 | Fill #0

## 2020-05-14 MED FILL — LISINOPRIL 10 MG TABS: 10 | 90 days supply | Qty: 90 | Fill #1

## 2020-07-12 MED FILL — ESCITALOPRAM 10 MG TABLET: 10 | 90 days supply | Qty: 90 | Fill #1

## 2020-07-27 MED FILL — ZOLPIDEM TARTRATE 10 MG TAB: 10 | 90 days supply | Qty: 90 | Fill #1

## 2020-08-26 ENCOUNTER — Other Ambulatory Visit (HOSPITAL_COMMUNITY): Payer: Self-pay | Admitting: Family Medicine

## 2020-08-26 DIAGNOSIS — F325 Major depressive disorder, single episode, in full remission: Secondary | ICD-10-CM | POA: Diagnosis not present

## 2020-08-26 DIAGNOSIS — E559 Vitamin D deficiency, unspecified: Secondary | ICD-10-CM | POA: Diagnosis not present

## 2020-08-26 DIAGNOSIS — Z23 Encounter for immunization: Secondary | ICD-10-CM | POA: Diagnosis not present

## 2020-08-26 DIAGNOSIS — I10 Essential (primary) hypertension: Secondary | ICD-10-CM | POA: Diagnosis not present

## 2020-08-26 MED FILL — LISINOPRIL 5 MG TABS: 5 | 90 days supply | Qty: 90 | Fill #0

## 2020-10-06 DIAGNOSIS — H5203 Hypermetropia, bilateral: Secondary | ICD-10-CM | POA: Diagnosis not present

## 2020-11-05 ENCOUNTER — Other Ambulatory Visit (HOSPITAL_COMMUNITY): Payer: Self-pay

## 2020-11-09 ENCOUNTER — Other Ambulatory Visit (HOSPITAL_COMMUNITY): Payer: Self-pay

## 2020-11-09 MED ORDER — ZOLPIDEM TARTRATE 10 MG PO TABS
5.0000 mg | ORAL_TABLET | Freq: Every evening | ORAL | 1 refills | Status: DC | PRN
  Filled 2020-11-09: qty 90, 90d supply, fill #0

## 2020-11-09 MED FILL — Zolpidem Tartrate Tab 10 MG: ORAL | 90 days supply | Qty: 90 | Fill #0 | Status: AC

## 2020-11-11 ENCOUNTER — Other Ambulatory Visit (HOSPITAL_COMMUNITY): Payer: Self-pay

## 2020-11-11 MED ORDER — CARESTART COVID-19 HOME TEST VI KIT
PACK | 0 refills | Status: DC
  Filled 2020-11-11: qty 4, 4d supply, fill #0

## 2020-11-13 MED FILL — Lisinopril Tab 5 MG: ORAL | 90 days supply | Qty: 90 | Fill #0 | Status: AC

## 2020-11-15 ENCOUNTER — Other Ambulatory Visit (HOSPITAL_COMMUNITY): Payer: Self-pay

## 2020-11-16 ENCOUNTER — Other Ambulatory Visit (HOSPITAL_COMMUNITY): Payer: Self-pay

## 2021-01-03 ENCOUNTER — Other Ambulatory Visit (HOSPITAL_COMMUNITY): Payer: Self-pay

## 2021-01-03 MED ORDER — AZITHROMYCIN 500 MG PO TABS
500.0000 mg | ORAL_TABLET | Freq: Every day | ORAL | 0 refills | Status: DC
  Filled 2021-01-03: qty 3, 3d supply, fill #0

## 2021-01-16 MED FILL — Escitalopram Oxalate Tab 10 MG (Base Equiv): ORAL | 90 days supply | Qty: 90 | Fill #0 | Status: AC

## 2021-01-17 ENCOUNTER — Other Ambulatory Visit (HOSPITAL_COMMUNITY): Payer: Self-pay

## 2021-01-27 ENCOUNTER — Other Ambulatory Visit: Payer: Self-pay

## 2021-01-27 ENCOUNTER — Inpatient Hospital Stay: Payer: 59 | Attending: Adult Health

## 2021-01-27 DIAGNOSIS — Z853 Personal history of malignant neoplasm of breast: Secondary | ICD-10-CM | POA: Insufficient documentation

## 2021-01-27 DIAGNOSIS — C50911 Malignant neoplasm of unspecified site of right female breast: Secondary | ICD-10-CM

## 2021-01-27 DIAGNOSIS — E559 Vitamin D deficiency, unspecified: Secondary | ICD-10-CM

## 2021-01-27 DIAGNOSIS — Z9221 Personal history of antineoplastic chemotherapy: Secondary | ICD-10-CM | POA: Insufficient documentation

## 2021-01-27 DIAGNOSIS — Z9011 Acquired absence of right breast and nipple: Secondary | ICD-10-CM | POA: Diagnosis not present

## 2021-01-27 DIAGNOSIS — Z79899 Other long term (current) drug therapy: Secondary | ICD-10-CM | POA: Diagnosis not present

## 2021-01-27 DIAGNOSIS — Z923 Personal history of irradiation: Secondary | ICD-10-CM | POA: Insufficient documentation

## 2021-01-27 LAB — CBC WITH DIFFERENTIAL (CANCER CENTER ONLY)
Abs Immature Granulocytes: 0.01 10*3/uL (ref 0.00–0.07)
Basophils Absolute: 0.1 10*3/uL (ref 0.0–0.1)
Basophils Relative: 1 %
Eosinophils Absolute: 0.2 10*3/uL (ref 0.0–0.5)
Eosinophils Relative: 3 %
HCT: 41.5 % (ref 36.0–46.0)
Hemoglobin: 14.6 g/dL (ref 12.0–15.0)
Immature Granulocytes: 0 %
Lymphocytes Relative: 34 %
Lymphs Abs: 2.9 10*3/uL (ref 0.7–4.0)
MCH: 32.7 pg (ref 26.0–34.0)
MCHC: 35.2 g/dL (ref 30.0–36.0)
MCV: 93 fL (ref 80.0–100.0)
Monocytes Absolute: 0.6 10*3/uL (ref 0.1–1.0)
Monocytes Relative: 7 %
Neutro Abs: 4.6 10*3/uL (ref 1.7–7.7)
Neutrophils Relative %: 55 %
Platelet Count: 304 10*3/uL (ref 150–400)
RBC: 4.46 MIL/uL (ref 3.87–5.11)
RDW: 12.2 % (ref 11.5–15.5)
WBC Count: 8.4 10*3/uL (ref 4.0–10.5)
nRBC: 0 % (ref 0.0–0.2)

## 2021-01-27 LAB — CMP (CANCER CENTER ONLY)
ALT: 23 U/L (ref 0–44)
AST: 20 U/L (ref 15–41)
Albumin: 3.6 g/dL (ref 3.5–5.0)
Alkaline Phosphatase: 107 U/L (ref 38–126)
Anion gap: 8 (ref 5–15)
BUN: 11 mg/dL (ref 8–23)
CO2: 30 mmol/L (ref 22–32)
Calcium: 9.6 mg/dL (ref 8.9–10.3)
Chloride: 105 mmol/L (ref 98–111)
Creatinine: 0.76 mg/dL (ref 0.44–1.00)
GFR, Estimated: >60 mL/min (ref >60)
Glucose, Bld: 87 mg/dL (ref 70–99)
Potassium: 4.5 mmol/L (ref 3.5–5.1)
Sodium: 143 mmol/L (ref 135–145)
Total Bilirubin: 0.4 mg/dL (ref 0.3–1.2)
Total Protein: 6.9 g/dL (ref 6.5–8.1)

## 2021-01-27 LAB — VITAMIN D 25 HYDROXY (VIT D DEFICIENCY, FRACTURES): Vit D, 25-Hydroxy: 35.99 ng/mL (ref 30–100)

## 2021-01-28 LAB — CANCER ANTIGEN 27.29: CA 27.29: 23.9 U/mL (ref 0.0–38.6)

## 2021-02-01 ENCOUNTER — Telehealth: Payer: Self-pay | Admitting: Adult Health

## 2021-02-01 NOTE — Telephone Encounter (Signed)
Rescheduled per provider called and spoke with pt confirmed 7/18 appt

## 2021-02-03 ENCOUNTER — Inpatient Hospital Stay: Payer: 59 | Admitting: Adult Health

## 2021-02-03 DIAGNOSIS — Z1231 Encounter for screening mammogram for malignant neoplasm of breast: Secondary | ICD-10-CM | POA: Diagnosis not present

## 2021-02-04 ENCOUNTER — Other Ambulatory Visit (HOSPITAL_COMMUNITY): Payer: Self-pay

## 2021-02-04 DIAGNOSIS — M545 Low back pain, unspecified: Secondary | ICD-10-CM | POA: Diagnosis not present

## 2021-02-04 DIAGNOSIS — F325 Major depressive disorder, single episode, in full remission: Secondary | ICD-10-CM | POA: Diagnosis not present

## 2021-02-04 DIAGNOSIS — I10 Essential (primary) hypertension: Secondary | ICD-10-CM | POA: Diagnosis not present

## 2021-02-04 DIAGNOSIS — G4700 Insomnia, unspecified: Secondary | ICD-10-CM | POA: Diagnosis not present

## 2021-02-04 DIAGNOSIS — Z23 Encounter for immunization: Secondary | ICD-10-CM | POA: Diagnosis not present

## 2021-02-04 MED ORDER — ZOLPIDEM TARTRATE 5 MG PO TABS
2.5000 mg | ORAL_TABLET | Freq: Every evening | ORAL | 1 refills | Status: DC | PRN
  Filled 2021-02-04: qty 90, 90d supply, fill #0

## 2021-02-04 MED ORDER — LISINOPRIL 5 MG PO TABS
5.0000 mg | ORAL_TABLET | Freq: Every day | ORAL | 1 refills | Status: DC
  Filled 2021-02-04: qty 90, 90d supply, fill #0

## 2021-02-04 MED ORDER — CYCLOBENZAPRINE HCL 10 MG PO TABS
10.0000 mg | ORAL_TABLET | Freq: Every evening | ORAL | 1 refills | Status: DC | PRN
  Filled 2021-02-04: qty 30, 30d supply, fill #0
  Filled 2022-01-03 – 2022-01-12 (×2): qty 30, 30d supply, fill #1

## 2021-02-07 ENCOUNTER — Inpatient Hospital Stay: Payer: 59 | Admitting: Adult Health

## 2021-02-13 NOTE — Progress Notes (Signed)
CLINIC:  Survivorship   REASON FOR VISIT:  Routine follow-up for history of breast cancer.   BRIEF ONCOLOGIC HISTORY:  Per Dr. Virgie Dad recent note:    (1) status post right modified radical mastectomy in May 2009 for a multifocal/multicentric breast carcinoma, the major component being lobular with a minor ductal component, pathologically T2 N1, Stage IIB, grade 2, estrogen and progesterone receptor positive, HER2 negative, with a low MIB-1,   (2) treated adjuvantly with dose-dense doxorubicin/ cyclophosphamide x4 followed by weekly paclitaxel x12, completed October 2009,   (3) radiation completed on December 2009   (4) started tamoxifen December 2009.  the goal is to continue on tamoxifen for a total of 10 years, until December 2019.   (4)  lymphedema, right upper extremity   (5) genetics testing 02/04/2018             (a) Results: Negative, No pathogenic variants identified.  The date of this test report is 02/13/2018. The Multi-Cancer Panel offered by [facility] includes sequencing and/or deletion duplication testing of the following 46 genes:AIP, ALK, APC, ATM, AXIN2,BAP1,  BARD1, BLM, BMPR1A, BRCA1, BRCA2, BRIP1, CASR, CDC73, CDH1, CDK4, CDKN1B, CDKN1C, CDKN2A (p14ARF), CDKN2A (p16INK4a), CEBPA, CHEK2, CTNNA1, DICER1, DIS3L2, EGFR (c.2369C>T, p.Thr790Met variant only), EPCAM (Deletion/duplication testing only), FH, FLCN, GATA2, GPC3, GREM1 (Promoter region deletion/duplication testing only), HOXB13 (c.251G>A, p.Gly84Glu), HRAS, KIT, MAX, MEN1, MET, MITF (c.952G>A, p.Glu318Lys variant only), MLH1, MSH2, MSH3, MSH6, MUTYH, NBN, NF1, NF2, NTHL1, PALB2, PDGFRA, PHOX2B, PMS2, POLD1, POLE, POT1, PRKAR1A, PTCH1, PTEN, RAD50, RAD51C, RAD51D, RB1, RECQL4, RET, RUNX1, SDHAF2, SDHA (sequence changes only), SDHB, SDHC, SDHD, SMAD4, SMARCA4, SMARCB1, SMARCE1, STK11, SUFU, TERC, TERT, TMEM127, TP53, TSC1, TSC2, VHL, WRN and WT1.  INTERVAL HISTORY:  [name] Gibson presents to the [facility]orship [facility]  today for routine follow-up for her history of breast cancer.  Overall, she reports feeling quite well.  She realized this morning she lost her purse and this has created increased stress for her.    She underwent a mammogram on 02/03/2021 that showed no evidence of malignancy and breast density category B.    She started Tirzepatide on a clinical trial for weight loss.  She has lost 70 pounds.  She started this trial in 10/2019.  She notes that initially she had some nausea, however, since then she has tolerated it quite well.  She denies any new issues.   She exercises by walking 30 minutes 5 days a week.  She notes that she has been going to the gym 3 times a week.     REVIEW OF SYSTEMS:  Review of Systems  Constitutional:  Negative for appetite change, chills, fatigue, fever and unexpected weight change.  HENT:   Negative for hearing loss, lump/mass and trouble swallowing.   Eyes:  Negative for eye problems and icterus.  Respiratory:  Negative for chest tightness, cough and shortness of breath.   Cardiovascular:  Negative for chest pain, leg swelling and palpitations.  Gastrointestinal:  Negative for abdominal distention, abdominal pain, constipation, diarrhea, nausea and vomiting.  Endocrine: Negative for hot flashes.  Genitourinary:  Negative for difficulty urinating.   Musculoskeletal:  Negative for arthralgias.  Skin:  Negative for itching and rash.  Neurological:  Negative for dizziness, extremity weakness, headaches and numbness.  Hematological:  Negative for adenopathy. Does not bruise/bleed easily.  Psychiatric/Behavioral:  Negative for depression. The patient is not nervous/anxious.   Breast: Denies any new nodularity, masses, tenderness, nipple changes, or nipple discharge.       PAST  MEDICAL/SURGICAL HISTORY:  Past Medical History:  Diagnosis Date   Arthritis    Endometrial polyp    Family history of brain cancer 02/05/2018   Family history of breast cancer 02/05/2018    Family history of melanoma 02/05/2018   Heart murmur    10-06-2019  per pt no symptoms, found by pcp, has had echo 12-24-2007 in epic   History of cancer chemotherapy    right breast completed 04-2008   History of cellulitis    05/ 2014 and 01/ 2015   History of cervical dysplasia    CIN III  s/p  cervical conization 1980s   History of external beam radiation therapy    right breast  completed 06-2008   History of pituitary adenoma    10-06-2019  per pt dx microadenoma 1990; MRI in 1995 adenoma had resolved   Hypertension    followed by pcp---  (10-06-2019  pt had normal ETT 03-02-2014 in epic   Lymphedema of right upper extremity    03-15-201 --- chronic due to hx breast mastectomy w/ node dissections 05/ 2009   Malignant neoplasm of overlapping sites of right breast in female, estrogen receptor positive [facility]) oncologist--- dr Jana Hakim   dx 04/ 2009;  Stage IIB, multifocal/ multiple centric major component lobular w/ minor ductal component, Grade2 , ER/PR+, HER2 negative;   11-27-2007  s/p  right modified radial mastectomy with node dissections;  completed chemo 04-2008 and radiation 06-2008   Ovarian cyst    PONV (postoperative nausea and vomiting)    Psoriasis    10-06-2019 per pt currently right palm   Wears glasses    Past Surgical History:  Procedure Laterality Date   ANAL FISSURE REPAIR  1980s   CERVICAL CONIZATION W/BX  [place] N/A 10/08/2019   Procedure: [place];  Surgeon: Princess Bruins, MD;  Location: [place];  Service: Gynecology;  Laterality: N/A;   LAPAROSCOPIC BILATERAL SALPINGO OOPHERECTOMY Bilateral 10/08/2019   Procedure: LAPAROSCOPIC BILATERAL SALPINGO OOPHORECTOMY WITH PERITONEAL WASHINGS;  Surgeon: Princess Bruins, MD;  Location: [place];  Service: Gynecology;  Laterality: Bilateral;  request 9:00 OR time in [facility]  block time requests two hours   LAPAROSCOPIC CHOLECYSTECTOMY  09-07-2008  _0    MODIFIED RADICAL MASTECTOMY W/ AXILLARY LYMPH NODE DISSECTION Right 11-27-2007  dr Hassell Done _1    11-28-2007  post op evacuation hematoma right mastectomy site   TONSILLECTOMY  child     ALLERGIES:  Allergies  Allergen Reactions   Sulfa Antibiotics Hives     CURRENT MEDICATIONS:  Outpatient Encounter Medications as of 02/14/2021  Medication Sig   Calcium Carbonate-Vitamin D (CALCIUM + D PO) Take 1 tablet by mouth 2 (two) times daily.    COVID-19 At Home Antigen Test [facility] COVID-19 HOME TEST) KIT Use as directed within package instructions.   cyclobenzaprine (FLEXERIL) 10 MG tablet Take 1 tablet (10 mg total) by mouth at bedtime as needed for spasms.   glucosamine-chondroitin 500-400 MG tablet Take 1 tablet by mouth 2 (two) times daily.   lisinopril (ZESTRIL) 5 MG tablet Take 1 tablet (5 mg total) by mouth daily.   NON FORMULARY Tirzepatide 10 mg (clinical trial drug) 1 injection weekly.   prochlorperazine (COMPAZINE) 10 MG tablet TAKE 1 TABLET (10 MG TOTAL) BY MOUTH EVERY 6 (SIX) HOURS AS NEEDED FOR NAUSEA OR VOMITING.   zolpidem (AMBIEN) 10 MG tablet TAKE 1/2 TO 1 TABLET BY MOUTH AT BEDTIME AS  NEEDED   [DISCONTINUED] azithromycin (ZITHROMAX) 500 MG tablet Take 1 tablet (500 mg total) by mouth daily start the day of surgery   [DISCONTINUED] escitalopram (LEXAPRO) 10 MG tablet Take 1 tablet (10 mg total) by mouth daily. (Patient taking differently: Take 10 mg by mouth at bedtime. )   [DISCONTINUED] escitalopram (LEXAPRO) 10 MG tablet TAKE 1 TABLET BY MOUTH ONCE A DAY   [DISCONTINUED] escitalopram (LEXAPRO) 10 MG tablet TAKE 1 TABLET BY MOUTH ONCE DAILY   [DISCONTINUED] hydrochlorothiazide (HYDRODIURIL) 25 MG tablet Take 25 mg by mouth daily.    [DISCONTINUED] lisinopril (PRINIVIL,ZESTRIL) 40 MG tablet Take 40 mg by mouth at bedtime.    [DISCONTINUED] lisinopril (ZESTRIL) 10 MG tablet TAKE 1 TABLET BY  MOUTH ONCE DAILY   [DISCONTINUED] lisinopril (ZESTRIL) 5 MG tablet Take 1 tablet (5 mg total) by mouth daily.   [DISCONTINUED] potassium chloride SA (K-DUR,KLOR-CON) 20 MEQ tablet Take 20 mEq by mouth daily.   [DISCONTINUED] prochlorperazine (COMPAZINE) 10 MG tablet TAKE 1 TABLET (10 MG TOTAL) BY MOUTH EVERY 6 (SIX) HOURS AS NEEDED FOR NAUSEA OR VOMITING.   [DISCONTINUED] zolpidem (AMBIEN) 10 MG tablet TAKE 1/2 TO 1 TABLET BY MOUTH AT BEDTIME AS NEEDED   [DISCONTINUED] zolpidem (AMBIEN) 10 MG tablet Take 0.5-1 tablets (5-10 mg total) by mouth at bedtime as needed.   [DISCONTINUED] zolpidem (AMBIEN) 5 MG tablet Take 5 mg by mouth at bedtime as needed.   [DISCONTINUED] zolpidem (AMBIEN) 5 MG tablet Take 0.5-1 tablets (2.5-5 mg total) by mouth at bedtime as needed.   No facility-administered encounter medications on file as of 02/14/2021.     ONCOLOGIC FAMILY HISTORY:  Family History  Problem Relation Age of Onset   Breast cancer Cousin        64's, passed away form disease   Breast cancer Cousin 67   Hypertension Mother    Melanoma Mother        bottom of foot   Diabetes Father    Hypertension Father    Heart disease Father    Parkinsonism Father    COPD Brother    Brain cancer Maternal Aunt        rare type of tumor   Cancer Maternal Aunt        type unk   Cancer Paternal Aunt        type unk     SOCIAL HISTORY:  Social History   Socioeconomic History   Marital status: Married    Spouse name: Not on file   Number of children: Not on file   Years of education: Not on file   Highest education level: Not on file  Occupational History   Not on file  Tobacco Use   Smoking status: Former    Years: 4.00    Types: Cigarettes    Quit date: 10/06/1983    Years since quitting: 37.3   Smokeless tobacco: Never  Vaping Use   Vaping Use: Never used  Substance and Sexual Activity   Alcohol use: No   Drug use: Never   Sexual activity: Not on file  Other Topics Concern   Not  on file  Social History Narrative   Not on file   Social Determinants of Health   Financial Resource Strain: Not on file  Food Insecurity: Not on file  Transportation Needs: Not on file  Physical Activity: Not on file  Stress: Not on file  Social Connections: Not on file  Intimate Partner Violence: Not on file  PHYSICAL EXAMINATION:  Vital Signs: Vitals:   02/14/21 0803  BP: 122/84  Pulse: 96  Resp: 18  Temp: 97.6 F (36.4 C)  SpO2: 100%   Filed Weights   02/14/21 0803  Weight: 181 lb 3 oz (82.2 kg)   General: Well-nourished, well-appearing female in no acute distress.   HEENT: Head is normocephalic.  Pupils equal and reactive to light. Conjunctivae clear without exudate.  Sclerae anicteric. Oral mucosa is pink, moist.  Oropharynx is pink without lesions or erythema.  Lymph: No cervical, supraclavicular, or infraclavicular lymphadenopathy noted on palpation.  Cardiovascular: Regular rate and rhythm.Marland Kitchen Respiratory: Clear to auscultation bilaterally. Chest expansion symmetric; breathing non-labored.  Breast Exam:  -Left breast: No appreciable masses on palpation. No skin redness, thickening, or peau d'orange appearance; no nipple retraction or nipple discharge; -Right breast is surgically absent, no sign of local recurrence -Axilla: No axillary adenopathy bilaterally.  GI: Abdomen soft and round; non-tender, non-distended. Bowel sounds normoactive. No hepatosplenomegaly.   GU: Deferred.  Neuro: No focal deficits. Steady gait.  Psych: Mood and affect normal and appropriate for situation.  MSK: No focal spinal tenderness to palpation, full range of motion in bilateral upper extremities Extremities: No edema. Skin: Warm and dry.  LABORATORY DATA:  None for this visit   DIAGNOSTIC IMAGING:  Most recent mammogram:     ASSESSMENT AND PLAN:  Ms.. Gibson is a pleasant 65 y.o. female with history of Stage IIB right breast invasive ductal carcinoma, ER+/PR+/HER2-,  diagnosed in 2009, treated with mastectomy, adjuvant chemotherapy, adjuvant radiation therapy, Tamoxifen x 10 years through 06/2018.  She presents to the Survivorship Clinic for surveillance and routine follow-up.   1. History of breast cancer:  [name] Gibson is currently clinically and radiographically without evidence of disease or recurrence of breast cancer. She will be due for mammogram in 01/2022; orders placed today. She will return in one year for continued surveillance and monitoring.  I encouraged her to call me with any questions or concerns before her next visit at the cancer center, and I would be happy to see her sooner, if needed.    2. Right arm lymphedema: She wears a sleeve regularly. She has lost about 70 pounds, and is considering returning to PT for wrapping to see if that will help her lymphedema.  She plans on doing this in the fall/winter, and then requesting for me to write for new sleeves.  She will also let me know when she would like for me to re refer her to PT.  3. Bone health:   She was given education on specific food and activities to promote bone health.  4. Cancer screening:  Due to [name] Gibson's history and her age, she should receive screening for skin cancers, colon cancer, and gynecologic cancers. She was encouraged to follow-up with her PCP for appropriate cancer screenings.   5. Health maintenance and wellness promotion: [name] Gibson was encouraged to consume 5-7 servings of fruits and vegetables per day. She was also encouraged to engage in moderate to vigorous exercise for 30 minutes per day most days of the week. She was instructed to limit her alcohol consumption and continue to abstain from tobacco use.  We are requesting her health maintenance records from her PCP so that we can update our records.      Dispo:  -Return to cancer center in one year for LTS f/u -Mammogram in 01/2022   Total encounter time: 30 minutes* in face to face visit time, chart review,  order entry, and documentation of the encounter.   Wilber Bihari, NP 02/14/21 8:25 AM Medical Oncology and Hematology [facility] [place], [place] [address] Tel. [phone]    Fax. [phone]  *Total Encounter Time as defined by the Centers for Medicare and Medicaid Services includes, in addition to the face-to-face time of a patient visit (documented in the note above) non-face-to-face time: obtaining and reviewing outside history, ordering and reviewing medications, tests or procedures, care coordination (communications with other health care professionals or caregivers) and documentation in the medical record.   Note: PRIMARY CARE PROVIDER Harlan Stains, [place] [phone]

## 2021-02-14 ENCOUNTER — Encounter: Payer: Self-pay | Admitting: Adult Health

## 2021-02-14 ENCOUNTER — Inpatient Hospital Stay: Payer: 59 | Admitting: Adult Health

## 2021-02-14 ENCOUNTER — Other Ambulatory Visit: Payer: Self-pay

## 2021-02-14 ENCOUNTER — Other Ambulatory Visit (HOSPITAL_COMMUNITY): Payer: Self-pay

## 2021-02-14 VITALS — BP 122/84 | HR 96 | Temp 97.6°F | Resp 18 | Ht 66.0 in | Wt 181.2 lb

## 2021-02-14 DIAGNOSIS — Z9011 Acquired absence of right breast and nipple: Secondary | ICD-10-CM | POA: Diagnosis not present

## 2021-02-14 DIAGNOSIS — Z170 Estrogen receptor positive status [ER+]: Secondary | ICD-10-CM

## 2021-02-14 DIAGNOSIS — Z79899 Other long term (current) drug therapy: Secondary | ICD-10-CM | POA: Diagnosis not present

## 2021-02-14 DIAGNOSIS — Z853 Personal history of malignant neoplasm of breast: Secondary | ICD-10-CM | POA: Diagnosis not present

## 2021-02-14 DIAGNOSIS — C50911 Malignant neoplasm of unspecified site of right female breast: Secondary | ICD-10-CM | POA: Diagnosis not present

## 2021-02-14 DIAGNOSIS — Z9221 Personal history of antineoplastic chemotherapy: Secondary | ICD-10-CM | POA: Diagnosis not present

## 2021-02-14 DIAGNOSIS — Z923 Personal history of irradiation: Secondary | ICD-10-CM | POA: Diagnosis not present

## 2021-02-14 MED ORDER — LISINOPRIL 5 MG PO TABS
5.0000 mg | ORAL_TABLET | Freq: Every day | ORAL | 1 refills | Status: DC
  Filled 2021-02-14 – 2021-05-24 (×2): qty 90, 90d supply, fill #0

## 2021-02-14 MED ORDER — PROCHLORPERAZINE MALEATE 10 MG PO TABS
10.0000 mg | ORAL_TABLET | Freq: Four times a day (QID) | ORAL | 1 refills | Status: DC | PRN
  Filled 2021-02-14: qty 30, 8d supply, fill #0

## 2021-02-18 DIAGNOSIS — C50911 Malignant neoplasm of unspecified site of right female breast: Secondary | ICD-10-CM | POA: Diagnosis not present

## 2021-02-24 DIAGNOSIS — C50911 Malignant neoplasm of unspecified site of right female breast: Secondary | ICD-10-CM | POA: Diagnosis not present

## 2021-03-11 ENCOUNTER — Other Ambulatory Visit (HOSPITAL_COMMUNITY): Payer: Self-pay

## 2021-03-11 MED ORDER — ESCITALOPRAM OXALATE 10 MG PO TABS
10.0000 mg | ORAL_TABLET | Freq: Every day | ORAL | 1 refills | Status: DC
  Filled 2021-03-11 – 2021-05-09 (×2): qty 90, 90d supply, fill #0

## 2021-03-14 ENCOUNTER — Other Ambulatory Visit (HOSPITAL_COMMUNITY): Payer: Self-pay

## 2021-05-06 ENCOUNTER — Other Ambulatory Visit (HOSPITAL_COMMUNITY): Payer: Self-pay

## 2021-05-06 MED ORDER — CARESTART COVID-19 HOME TEST VI KIT
PACK | 0 refills | Status: DC
  Filled 2021-05-06: qty 4, 4d supply, fill #0

## 2021-05-10 ENCOUNTER — Other Ambulatory Visit (HOSPITAL_COMMUNITY): Payer: Self-pay

## 2021-05-11 ENCOUNTER — Other Ambulatory Visit (HOSPITAL_COMMUNITY): Payer: Self-pay

## 2021-05-11 MED ORDER — ZOLPIDEM TARTRATE 5 MG PO TABS
2.5000 mg | ORAL_TABLET | Freq: Every evening | ORAL | 1 refills | Status: DC | PRN
  Filled 2021-05-11: qty 90, 90d supply, fill #0
  Filled 2021-08-10: qty 90, 90d supply, fill #1

## 2021-05-24 ENCOUNTER — Other Ambulatory Visit (HOSPITAL_COMMUNITY): Payer: Self-pay

## 2021-08-09 ENCOUNTER — Other Ambulatory Visit (HOSPITAL_COMMUNITY): Payer: Self-pay

## 2021-08-09 DIAGNOSIS — F325 Major depressive disorder, single episode, in full remission: Secondary | ICD-10-CM | POA: Diagnosis not present

## 2021-08-09 DIAGNOSIS — I10 Essential (primary) hypertension: Secondary | ICD-10-CM | POA: Diagnosis not present

## 2021-08-09 DIAGNOSIS — G4700 Insomnia, unspecified: Secondary | ICD-10-CM | POA: Diagnosis not present

## 2021-08-09 DIAGNOSIS — Z23 Encounter for immunization: Secondary | ICD-10-CM | POA: Diagnosis not present

## 2021-08-09 DIAGNOSIS — F419 Anxiety disorder, unspecified: Secondary | ICD-10-CM | POA: Diagnosis not present

## 2021-08-09 DIAGNOSIS — E559 Vitamin D deficiency, unspecified: Secondary | ICD-10-CM | POA: Diagnosis not present

## 2021-08-09 MED ORDER — ESCITALOPRAM OXALATE 10 MG PO TABS
10.0000 mg | ORAL_TABLET | Freq: Every day | ORAL | 1 refills | Status: DC
  Filled 2021-08-09: qty 90, 90d supply, fill #0
  Filled 2021-11-06: qty 90, 90d supply, fill #1

## 2021-08-09 MED ORDER — INSULIN PEN NEEDLE 31G X 5 MM MISC
3 refills | Status: DC
  Filled 2021-08-09 – 2021-08-11 (×2): qty 100, 90d supply, fill #0

## 2021-08-09 MED ORDER — LISINOPRIL 10 MG PO TABS
10.0000 mg | ORAL_TABLET | Freq: Every day | ORAL | 1 refills | Status: DC
  Filled 2021-08-09: qty 90, 90d supply, fill #0
  Filled 2021-11-06: qty 90, 90d supply, fill #1

## 2021-08-09 MED ORDER — ZOLPIDEM TARTRATE 5 MG PO TABS
2.5000 mg | ORAL_TABLET | Freq: Every evening | ORAL | 1 refills | Status: DC | PRN
  Filled 2021-08-09: qty 90, 90d supply, fill #0
  Filled 2021-11-06: qty 90, 90d supply, fill #1

## 2021-08-09 MED ORDER — SAXENDA 18 MG/3ML ~~LOC~~ SOPN
PEN_INJECTOR | SUBCUTANEOUS | 2 refills | Status: AC
  Filled 2021-08-09: qty 15, 30d supply, fill #0
  Filled 2021-08-10: qty 15, 35d supply, fill #0
  Filled 2021-08-11: qty 15, 30d supply, fill #0

## 2021-08-10 ENCOUNTER — Other Ambulatory Visit (HOSPITAL_COMMUNITY): Payer: Self-pay

## 2021-08-11 ENCOUNTER — Other Ambulatory Visit (HOSPITAL_COMMUNITY): Payer: Self-pay

## 2021-10-12 DIAGNOSIS — H5203 Hypermetropia, bilateral: Secondary | ICD-10-CM | POA: Diagnosis not present

## 2021-11-07 ENCOUNTER — Other Ambulatory Visit (HOSPITAL_COMMUNITY): Payer: Self-pay

## 2021-12-20 ENCOUNTER — Other Ambulatory Visit (HOSPITAL_COMMUNITY): Payer: Self-pay

## 2021-12-20 DIAGNOSIS — I890 Lymphedema, not elsewhere classified: Secondary | ICD-10-CM | POA: Diagnosis not present

## 2021-12-20 DIAGNOSIS — G4700 Insomnia, unspecified: Secondary | ICD-10-CM | POA: Diagnosis not present

## 2021-12-20 DIAGNOSIS — F325 Major depressive disorder, single episode, in full remission: Secondary | ICD-10-CM | POA: Diagnosis not present

## 2021-12-20 DIAGNOSIS — I10 Essential (primary) hypertension: Secondary | ICD-10-CM | POA: Diagnosis not present

## 2021-12-20 DIAGNOSIS — F419 Anxiety disorder, unspecified: Secondary | ICD-10-CM | POA: Diagnosis not present

## 2021-12-20 MED ORDER — ESCITALOPRAM OXALATE 10 MG PO TABS
10.0000 mg | ORAL_TABLET | Freq: Every day | ORAL | 1 refills | Status: DC
  Filled 2021-12-20 – 2022-02-16 (×2): qty 90, 90d supply, fill #0
  Filled 2022-05-17: qty 90, 90d supply, fill #1

## 2021-12-20 MED ORDER — LISINOPRIL 10 MG PO TABS
10.0000 mg | ORAL_TABLET | Freq: Every day | ORAL | 1 refills | Status: DC
  Filled 2021-12-20 – 2022-02-08 (×2): qty 90, 90d supply, fill #0
  Filled 2022-05-10: qty 90, 90d supply, fill #1

## 2021-12-20 MED ORDER — ZOLPIDEM TARTRATE 5 MG PO TABS
2.5000 mg | ORAL_TABLET | Freq: Every evening | ORAL | 1 refills | Status: DC | PRN
  Filled 2021-12-20 – 2022-02-08 (×2): qty 90, 90d supply, fill #0
  Filled 2022-05-10: qty 90, 90d supply, fill #1

## 2021-12-26 ENCOUNTER — Telehealth: Payer: Self-pay | Admitting: Adult Health

## 2021-12-26 NOTE — Telephone Encounter (Signed)
Rescheduled appointment per provider template. Patient is aware. During the phone call, patient asked about labs/tumor markers. I will reach out to the nurse.

## 2022-01-04 ENCOUNTER — Other Ambulatory Visit (HOSPITAL_COMMUNITY): Payer: Self-pay

## 2022-01-05 ENCOUNTER — Other Ambulatory Visit: Payer: Self-pay

## 2022-01-12 ENCOUNTER — Other Ambulatory Visit (HOSPITAL_COMMUNITY): Payer: Self-pay

## 2022-02-06 ENCOUNTER — Other Ambulatory Visit: Payer: Self-pay

## 2022-02-06 DIAGNOSIS — Z170 Estrogen receptor positive status [ER+]: Secondary | ICD-10-CM

## 2022-02-06 DIAGNOSIS — C50911 Malignant neoplasm of unspecified site of right female breast: Secondary | ICD-10-CM

## 2022-02-06 DIAGNOSIS — I890 Lymphedema, not elsewhere classified: Secondary | ICD-10-CM

## 2022-02-06 DIAGNOSIS — D352 Benign neoplasm of pituitary gland: Secondary | ICD-10-CM

## 2022-02-07 ENCOUNTER — Telehealth: Payer: Self-pay | Admitting: Adult Health

## 2022-02-07 ENCOUNTER — Inpatient Hospital Stay: Payer: 59 | Attending: Adult Health

## 2022-02-07 ENCOUNTER — Other Ambulatory Visit: Payer: Self-pay

## 2022-02-07 ENCOUNTER — Telehealth: Payer: Self-pay

## 2022-02-07 DIAGNOSIS — Z9221 Personal history of antineoplastic chemotherapy: Secondary | ICD-10-CM | POA: Insufficient documentation

## 2022-02-07 DIAGNOSIS — Z923 Personal history of irradiation: Secondary | ICD-10-CM | POA: Diagnosis not present

## 2022-02-07 DIAGNOSIS — C50911 Malignant neoplasm of unspecified site of right female breast: Secondary | ICD-10-CM

## 2022-02-07 DIAGNOSIS — Z170 Estrogen receptor positive status [ER+]: Secondary | ICD-10-CM

## 2022-02-07 DIAGNOSIS — D352 Benign neoplasm of pituitary gland: Secondary | ICD-10-CM

## 2022-02-07 DIAGNOSIS — Z79899 Other long term (current) drug therapy: Secondary | ICD-10-CM | POA: Insufficient documentation

## 2022-02-07 DIAGNOSIS — Z853 Personal history of malignant neoplasm of breast: Secondary | ICD-10-CM | POA: Diagnosis not present

## 2022-02-07 DIAGNOSIS — Z9011 Acquired absence of right breast and nipple: Secondary | ICD-10-CM | POA: Insufficient documentation

## 2022-02-07 DIAGNOSIS — I890 Lymphedema, not elsewhere classified: Secondary | ICD-10-CM

## 2022-02-07 LAB — CMP (CANCER CENTER ONLY)
ALT: 16 U/L (ref 0–44)
AST: 16 U/L (ref 15–41)
Albumin: 4 g/dL (ref 3.5–5.0)
Alkaline Phosphatase: 98 U/L (ref 38–126)
Anion gap: 6 (ref 5–15)
BUN: 16 mg/dL (ref 8–23)
CO2: 29 mmol/L (ref 22–32)
Calcium: 9.3 mg/dL (ref 8.9–10.3)
Chloride: 105 mmol/L (ref 98–111)
Creatinine: 0.73 mg/dL (ref 0.44–1.00)
GFR, Estimated: >60 mL/min (ref >60)
Glucose, Bld: 96 mg/dL (ref 70–99)
Potassium: 4.1 mmol/L (ref 3.5–5.1)
Sodium: 140 mmol/L (ref 135–145)
Total Bilirubin: 0.4 mg/dL (ref 0.3–1.2)
Total Protein: 6.9 g/dL (ref 6.5–8.1)

## 2022-02-07 LAB — CBC WITH DIFFERENTIAL (CANCER CENTER ONLY)
Abs Immature Granulocytes: 0.02 10*3/uL (ref 0.00–0.07)
Basophils Absolute: 0.1 10*3/uL (ref 0.0–0.1)
Basophils Relative: 1 %
Eosinophils Absolute: 0.3 10*3/uL (ref 0.0–0.5)
Eosinophils Relative: 3 %
HCT: 42.7 % (ref 36.0–46.0)
Hemoglobin: 15.1 g/dL — ABNORMAL HIGH (ref 12.0–15.0)
Immature Granulocytes: 0 %
Lymphocytes Relative: 33 %
Lymphs Abs: 2.6 10*3/uL (ref 0.7–4.0)
MCH: 31.9 pg (ref 26.0–34.0)
MCHC: 35.4 g/dL (ref 30.0–36.0)
MCV: 90.1 fL (ref 80.0–100.0)
Monocytes Absolute: 0.5 10*3/uL (ref 0.1–1.0)
Monocytes Relative: 7 %
Neutro Abs: 4.4 10*3/uL (ref 1.7–7.7)
Neutrophils Relative %: 56 %
Platelet Count: 284 10*3/uL (ref 150–400)
RBC: 4.74 MIL/uL (ref 3.87–5.11)
RDW: 12.3 % (ref 11.5–15.5)
WBC Count: 7.9 10*3/uL (ref 4.0–10.5)
nRBC: 0 % (ref 0.0–0.2)

## 2022-02-07 LAB — VITAMIN D 25 HYDROXY (VIT D DEFICIENCY, FRACTURES): Vit D, 25-Hydroxy: 30.99 ng/mL (ref 30–100)

## 2022-02-07 NOTE — Telephone Encounter (Signed)
-----   Message from Gardenia Phlegm, NP sent at 02/07/2022  3:27 PM EDT ----- Please let patient know we will discuss labs at her 7/31 visit ----- Message ----- From: [place]: 02/07/2022   8:21 AM EDT To: Gardenia Phlegm, NP

## 2022-02-07 NOTE — Telephone Encounter (Signed)
Per 7/18 phone line pt called to r/s appointment.  Pt picked the date and time.  Appointment was r/s per pt request

## 2022-02-07 NOTE — Telephone Encounter (Signed)
Attempted to call pt per NP's note. Pt did not answer; LVM for call back.

## 2022-02-08 ENCOUNTER — Telehealth: Payer: Self-pay | Admitting: *Deleted

## 2022-02-08 LAB — CANCER ANTIGEN 27.29: CA 27.29: 24.5 U/mL (ref 0.0–38.6)

## 2022-02-08 NOTE — Telephone Encounter (Signed)
Pt left message x 2 stating she is returning a call from nurse " she left a message stating to return call regarding my labs "  This RN noted per [facility] request " inform pt labs will be reviewed with her at visit on 7/31"  Noted elevated heme - with prior levels noted - otherwise normal labs.  This RN called and obtained identified VM- detailed message left per above.

## 2022-02-09 ENCOUNTER — Other Ambulatory Visit (HOSPITAL_COMMUNITY): Payer: Self-pay

## 2022-02-09 DIAGNOSIS — Z1231 Encounter for screening mammogram for malignant neoplasm of breast: Secondary | ICD-10-CM | POA: Diagnosis not present

## 2022-02-14 ENCOUNTER — Encounter: Payer: 59 | Admitting: Adult Health

## 2022-02-16 ENCOUNTER — Other Ambulatory Visit (HOSPITAL_COMMUNITY): Payer: Self-pay

## 2022-02-17 ENCOUNTER — Other Ambulatory Visit (HOSPITAL_COMMUNITY): Payer: Self-pay

## 2022-02-20 ENCOUNTER — Other Ambulatory Visit: Payer: Self-pay

## 2022-02-20 ENCOUNTER — Inpatient Hospital Stay: Payer: 59 | Admitting: Adult Health

## 2022-02-20 VITALS — BP 152/91 | HR 89 | Temp 97.9°F | Resp 16 | Ht 66.0 in | Wt 219.1 lb

## 2022-02-20 DIAGNOSIS — Z853 Personal history of malignant neoplasm of breast: Secondary | ICD-10-CM

## 2022-02-20 DIAGNOSIS — Z923 Personal history of irradiation: Secondary | ICD-10-CM | POA: Diagnosis not present

## 2022-02-20 DIAGNOSIS — Z79899 Other long term (current) drug therapy: Secondary | ICD-10-CM | POA: Diagnosis not present

## 2022-02-20 DIAGNOSIS — Z9221 Personal history of antineoplastic chemotherapy: Secondary | ICD-10-CM | POA: Diagnosis not present

## 2022-02-20 DIAGNOSIS — Z9011 Acquired absence of right breast and nipple: Secondary | ICD-10-CM | POA: Diagnosis not present

## 2022-02-20 NOTE — Progress Notes (Unsigned)
[facility] Follow up:    Harlan Stains, [place] [address]   DIAGNOSIS: Cancer Staging  Breast cancer, right breast [facility]) Staging form: Breast, AJCC 7th Edition - Clinical: Stage IIB (T2, N1, M0) - Signed by Chauncey Cruel, MD on 12/14/2014   SUMMARY OF ONCOLOGIC HISTORY: (1) status post right modified radical mastectomy in May 2009 for a multifocal/multicentric breast carcinoma, the major component being lobular with a minor ductal component, pathologically T2 N1, Stage IIB, grade 2, estrogen and progesterone receptor positive, HER2 negative, with a low MIB-1,   (2) treated adjuvantly with dose-dense doxorubicin/ cyclophosphamide x4 followed by weekly paclitaxel x12, completed October 2009,   (3) radiation completed on December 2009   (4) started tamoxifen December 2009.  the goal is to continue on tamoxifen for a total of 10 years, until December 2019.   (4)  lymphedema, right upper extremity   (5) genetics testing 02/04/2018             (a) Results: Negative, No pathogenic variants identified.  The date of this test report is 02/13/2018. The Multi-Cancer Panel offered by [facility] includes sequencing and/or deletion duplication testing of the following 33 genes:AIP, ALK, APC, ATM, AXIN2,BAP1,  BARD1, BLM, BMPR1A, BRCA1, BRCA2, BRIP1, CASR, CDC73, CDH1, CDK4, CDKN1B, CDKN1C, CDKN2A (p14ARF), CDKN2A (p16INK4a), CEBPA, CHEK2, CTNNA1, DICER1, DIS3L2, EGFR (c.2369C>T, p.Thr790Met variant only), EPCAM (Deletion/duplication testing only), FH, FLCN, GATA2, GPC3, GREM1 (Promoter region deletion/duplication testing only), HOXB13 (c.251G>A, p.Gly84Glu), HRAS, KIT, MAX, MEN1, MET, MITF (c.952G>A, p.Glu318Lys variant only), MLH1, MSH2, MSH3, MSH6, MUTYH, NBN, NF1, NF2, NTHL1, PALB2, PDGFRA, PHOX2B, PMS2, POLD1, POLE, POT1, PRKAR1A, PTCH1, PTEN, RAD50, RAD51C, RAD51D, RB1, RECQL4, RET, RUNX1, SDHAF2, SDHA (sequence changes only), SDHB, SDHC,  SDHD, SMAD4, SMARCA4, SMARCB1, SMARCE1, STK11, SUFU, TERC, TERT, TMEM127, TP53, TSC1, TSC2, VHL, WRN and WT1.  CURRENT THERAPY: Observation  INTERVAL HISTORY: [name] Gibson 66 y.o. female returns for follow-up of her history of stage IIb estrogen positive breast cancer that was diagnosed in 2009.  She continues on observation alone.  She is status post right mastectomy however has kept her left breast.  Her most recent left breast screening mammogram occurred on February 09, 2022 and showed no mammographic evidence of malignancy and breast density category B.  She is doing quite well today.  She has no new concerns.  She continues to see her primary care provider regularly.  She knows that she is overdue for her Pap smear and does plan on getting this scheduled.   Patient Active Problem List   Diagnosis Date Noted  . Genetic testing 02/25/2018  . Family history of breast cancer 02/05/2018  . Family history of brain cancer 02/05/2018  . Family history of melanoma 02/05/2018  . Malignant neoplasm of overlapping sites of right breast in female, estrogen receptor positive ([place]) 12/28/2017  . Psoriasis 08/23/2013  . Chronic acquired lymphedema 08/21/2013  . Breast cancer, right breast ([place]) 12/26/2011  . Hypertension   . Carcinoma in situ of cervix   . Ovarian cyst   . Cancer ([place])   . Pituitary microadenoma ([place])     is allergic to sulfa antibiotics.  MEDICAL HISTORY: Past Medical History:  Diagnosis Date  . Arthritis   . Endometrial polyp   . Family history of brain cancer 02/05/2018  . Family history of breast cancer 02/05/2018  . Family history of melanoma 02/05/2018  . Heart murmur    10-06-2019  per pt  no symptoms, found by pcp, has had echo 12-24-2007 in epic  . History of cancer chemotherapy    right breast completed 04-2008  . History of cellulitis    05/ 2014 and 01/ 2015  . History of cervical dysplasia    CIN III  s/p  cervical conization 1980s  . History of external  beam radiation therapy    right breast  completed 06-2008  . History of pituitary adenoma    10-06-2019  per pt dx microadenoma 1990; MRI in 1995 adenoma had resolved  . Hypertension    followed by pcp---  (10-06-2019  pt had normal ETT 03-02-2014 in epic  . Lymphedema of right upper extremity    03-15-201 --- chronic due to hx breast mastectomy w/ node dissections 05/ 2009  . Malignant neoplasm of overlapping sites of right breast in female, estrogen receptor positive [facility] oncologist--- dr Jana Hakim   dx 04/ 2009;  Stage IIB, multifocal/ multiple centric major component lobular w/ minor ductal component, Grade2 , ER/PR+, HER2 negative;   11-27-2007  s/p  right modified radial mastectomy with node dissections;  completed chemo 04-2008 and radiation 06-2008  . Ovarian cyst   . PONV (postoperative nausea and vomiting)   . Psoriasis    10-06-2019 per pt currently right palm  . Wears glasses     SURGICAL HISTORY: Past Surgical History:  Procedure Laterality Date  . ANAL FISSURE REPAIR  1980s  . CERVICAL CONIZATION W/BX  1980s  . DILATATION & CURETTAGE/HYSTEROSCOPY WITH MYOSURE N/A 10/08/2019   Procedure: DILATATION & CURETTAGE/HYSTEROSCOPY WITH MYOSURE;  Surgeon: Princess Bruins, MD;  Location: [place];  Service: Gynecology;  Laterality: N/A;  . LAPAROSCOPIC BILATERAL SALPINGO OOPHERECTOMY Bilateral 10/08/2019   Procedure: LAPAROSCOPIC BILATERAL SALPINGO OOPHORECTOMY WITH PERITONEAL WASHINGS;  Surgeon: Princess Bruins, MD;  Location: [place];  Service: Gynecology;  Laterality: Bilateral;  request 9:00 OR time in [facility] Gynecology block time requests two hours  . LAPAROSCOPIC CHOLECYSTECTOMY  09-07-2008  '@[facility]'   . MODIFIED RADICAL MASTECTOMY W/ AXILLARY LYMPH NODE DISSECTION Right 11-27-2007  dr Hassell Done '@[facility]'    11-28-2007  post op evacuation hematoma right mastectomy site  . TONSILLECTOMY  child    SOCIAL HISTORY: Social History   Socioeconomic  History  . Marital status: Married    Spouse name: Not on file  . Number of children: Not on file  . Years of education: Not on file  . Highest education level: Not on file  Occupational History  . Not on file  Tobacco Use  . Smoking status: Former    Years: 4.00    Types: Cigarettes    Quit date: 10/06/1983    Years since quitting: 38.4  . Smokeless tobacco: Never  Vaping Use  . Vaping Use: Never used  Substance and Sexual Activity  . Alcohol use: No  . Drug use: Never  . Sexual activity: Not on file  Other Topics Concern  . Not on file  Social History Narrative  . Not on file   Social Determinants of Health   Financial Resource Strain: Not on file  Food Insecurity: Not on file  Transportation Needs: Not on file  Physical Activity: Not on file  Stress: Not on file  Social Connections: Not on file  Intimate Partner Violence: Not on file    FAMILY HISTORY: Family History  Problem Relation Age of Onset  . Breast cancer Cousin        40's, passed away form disease  . Breast  cancer Cousin 41  . Hypertension Mother   . Melanoma Mother        bottom of foot  . Diabetes Father   . Hypertension Father   . Heart disease Father   . Parkinsonism Father   . COPD Brother   . Brain cancer Maternal Aunt        rare type of tumor  . Cancer Maternal Aunt        type unk  . Cancer Paternal Aunt        type unk    Review of Systems  Constitutional:  Negative for appetite change, chills, fatigue, fever and unexpected weight change.  HENT:   Negative for hearing loss, lump/mass and trouble swallowing.   Eyes:  Negative for eye problems and icterus.  Respiratory:  Negative for chest tightness, cough and shortness of breath.   Cardiovascular:  Negative for chest pain, leg swelling and palpitations.  Gastrointestinal:  Negative for abdominal distention, abdominal pain, constipation, diarrhea, nausea and vomiting.  Endocrine: Negative for hot flashes.  Genitourinary:   Negative for difficulty urinating.   Musculoskeletal:  Negative for arthralgias.  Skin:  Negative for itching and rash.  Neurological:  Negative for dizziness, extremity weakness, headaches and numbness.  Hematological:  Negative for adenopathy. Does not bruise/bleed easily.  Psychiatric/Behavioral:  Negative for depression. The patient is not nervous/anxious.       PHYSICAL EXAMINATION  ECOG PERFORMANCE STATUS: 0 - Asymptomatic  Vitals:   02/20/22 1350  BP: (!) 152/91  Pulse: 89  Resp: 16  Temp: 97.9 F (36.6 C)  SpO2: 98%    Physical Exam Constitutional:      General: She is not in acute distress.    Appearance: Normal appearance. She is not toxic-appearing.  HENT:     Head: Normocephalic and atraumatic.  Eyes:     General: No scleral icterus. Cardiovascular:     Rate and Rhythm: Normal rate and regular rhythm.     Pulses: Normal pulses.     Heart sounds: Normal heart sounds.  Pulmonary:     Effort: Pulmonary effort is normal.     Breath sounds: Normal breath sounds.  Chest:     Comments: Right breast status postmastectomy and radiation no sign of local recurrence, left breast is benign Abdominal:     General: Abdomen is flat. Bowel sounds are normal. There is no distension.     Palpations: Abdomen is soft.     Tenderness: There is no abdominal tenderness.  Musculoskeletal:        General: Swelling (Right arm lymphedema present) present.     Cervical back: Neck supple.  Lymphadenopathy:     Cervical: No cervical adenopathy.  Skin:    General: Skin is warm and dry.     Findings: No rash.  Neurological:     General: No focal deficit present.     Mental Status: She is alert.  Psychiatric:        Mood and Affect: Mood normal.        Behavior: Behavior normal.    LABORATORY DATA:  None for this visit   ASSESSMENT and THERAPY PLAN:   No problem-specific Assessment & Plan notes found for this encounter.   All questions were answered. The patient knows  to call the clinic with any problems, questions or concerns. We can certainly see the patient much sooner if necessary.  Total encounter time:*** minutes*in face-to-face visit time, chart review, lab review, care coordination, order entry, and documentation  of the encounter time.    Wilber Bihari, NP 02/22/22 5:12 PM Medical Oncology and Hematology [place], [place] [address] Tel. [phone]    Fax. [phone]  *Total Encounter Time as defined by the Centers for Medicare and Medicaid Services includes, in addition to the face-to-face time of a patient visit (documented in the note above) non-face-to-face time: obtaining and reviewing outside history, ordering and reviewing medications, tests or procedures, care coordination (communications with other health care professionals or caregivers) and documentation in the medical record.

## 2022-02-20 NOTE — Telephone Encounter (Signed)
No entry 

## 2022-02-22 NOTE — Assessment & Plan Note (Signed)
[name] Gibson is a 66 year old woman with history of stage IIb ER/PR positive breast cancer diagnosed in 2009 status post right mastectomy, adjuvant chemotherapy, adjuvant radiation and tamoxifen for 10 years completing in December 2019.  [name] Gibson has no clinical or radiographic sign of breast cancer recurrence.  She continues with annual left breast mammograms which have remained normal.  She is due for repeat mammogram in July 2024.  Right arm lymphedema she continues to wear her sleeve and follows up with physical therapy as needed.  Health maintenance/wellness promotion: We discussed

## 2022-02-23 ENCOUNTER — Encounter: Payer: Self-pay | Admitting: Adult Health

## 2022-03-20 DIAGNOSIS — D225 Melanocytic nevi of trunk: Secondary | ICD-10-CM | POA: Diagnosis not present

## 2022-03-20 DIAGNOSIS — Z1283 Encounter for screening for malignant neoplasm of skin: Secondary | ICD-10-CM | POA: Diagnosis not present

## 2022-03-20 DIAGNOSIS — L280 Lichen simplex chronicus: Secondary | ICD-10-CM | POA: Diagnosis not present

## 2022-04-04 DIAGNOSIS — E559 Vitamin D deficiency, unspecified: Secondary | ICD-10-CM | POA: Diagnosis not present

## 2022-04-04 DIAGNOSIS — R202 Paresthesia of skin: Secondary | ICD-10-CM | POA: Diagnosis not present

## 2022-04-04 DIAGNOSIS — M791 Myalgia, unspecified site: Secondary | ICD-10-CM | POA: Diagnosis not present

## 2022-04-06 ENCOUNTER — Other Ambulatory Visit (HOSPITAL_COMMUNITY): Payer: Self-pay

## 2022-04-06 MED ORDER — BETAMETHASONE DIPROPIONATE 0.05 % EX CREA
TOPICAL_CREAM | Freq: Two times a day (BID) | CUTANEOUS | 3 refills | Status: AC | PRN
  Filled 2022-04-06: qty 45, 30d supply, fill #0

## 2022-05-11 ENCOUNTER — Other Ambulatory Visit (HOSPITAL_COMMUNITY): Payer: Self-pay

## 2022-05-18 ENCOUNTER — Other Ambulatory Visit (HOSPITAL_COMMUNITY): Payer: Self-pay

## 2022-07-03 ENCOUNTER — Encounter: Payer: Self-pay | Admitting: Adult Health

## 2022-07-03 ENCOUNTER — Inpatient Hospital Stay: Payer: 59 | Attending: Adult Health | Admitting: Adult Health

## 2022-07-03 ENCOUNTER — Other Ambulatory Visit (HOSPITAL_COMMUNITY): Payer: Self-pay

## 2022-07-03 ENCOUNTER — Other Ambulatory Visit: Payer: Self-pay

## 2022-07-03 VITALS — BP 142/82 | HR 86 | Temp 97.7°F | Resp 18 | Ht 66.0 in | Wt 210.2 lb

## 2022-07-03 DIAGNOSIS — Z803 Family history of malignant neoplasm of breast: Secondary | ICD-10-CM | POA: Insufficient documentation

## 2022-07-03 DIAGNOSIS — N63 Unspecified lump in unspecified breast: Secondary | ICD-10-CM | POA: Diagnosis not present

## 2022-07-03 DIAGNOSIS — Z87891 Personal history of nicotine dependence: Secondary | ICD-10-CM | POA: Insufficient documentation

## 2022-07-03 DIAGNOSIS — Z9221 Personal history of antineoplastic chemotherapy: Secondary | ICD-10-CM | POA: Insufficient documentation

## 2022-07-03 DIAGNOSIS — Z9011 Acquired absence of right breast and nipple: Secondary | ICD-10-CM | POA: Diagnosis not present

## 2022-07-03 DIAGNOSIS — Z923 Personal history of irradiation: Secondary | ICD-10-CM | POA: Insufficient documentation

## 2022-07-03 DIAGNOSIS — Z79899 Other long term (current) drug therapy: Secondary | ICD-10-CM | POA: Diagnosis not present

## 2022-07-03 DIAGNOSIS — Z853 Personal history of malignant neoplasm of breast: Secondary | ICD-10-CM | POA: Diagnosis not present

## 2022-07-03 DIAGNOSIS — Z808 Family history of malignant neoplasm of other organs or systems: Secondary | ICD-10-CM | POA: Diagnosis not present

## 2022-07-03 DIAGNOSIS — I890 Lymphedema, not elsewhere classified: Secondary | ICD-10-CM | POA: Diagnosis not present

## 2022-07-03 MED ORDER — PROCHLORPERAZINE MALEATE 10 MG PO TABS
10.0000 mg | ORAL_TABLET | Freq: Four times a day (QID) | ORAL | 0 refills | Status: AC | PRN
  Filled 2022-07-03: qty 30, 8d supply, fill #0

## 2022-07-03 NOTE — Progress Notes (Signed)
[facility] Follow up:    [name] Stains, MD [place] [address]   DIAGNOSIS: History of right breast cancer  SUMMARY OF ONCOLOGIC HISTORY: (1) status post right modified radical mastectomy in May 2009 for a multifocal/multicentric breast carcinoma, the major component being lobular with a minor ductal component, pathologically T2 N1, Stage IIB, grade 2, estrogen and progesterone receptor positive, HER2 negative, with a low MIB-1,   (2) treated adjuvantly with dose-dense doxorubicin/ cyclophosphamide x4 followed by weekly paclitaxel x12, completed October 2009,   (3) radiation completed on December 2009   (4) started tamoxifen December 2009.  the goal is to continue on tamoxifen for a total of 10 years, until December 2019.   (4)  lymphedema, right upper extremity   (5) genetics testing 02/04/2018             (a) Results: Negative, No pathogenic variants identified.  The date of this test report is 02/13/2018. The Multi-Cancer Panel offered by [facility] includes sequencing and/or deletion duplication testing of the following 16 genes:AIP, ALK, APC, ATM, AXIN2,BAP1,  BARD1, BLM, BMPR1A, BRCA1, BRCA2, BRIP1, CASR, CDC73, CDH1, CDK4, CDKN1B, CDKN1C, CDKN2A (p14ARF), CDKN2A (p16INK4a), CEBPA, CHEK2, CTNNA1, DICER1, DIS3L2, EGFR (c.2369C>T, p.Thr790Met variant only), EPCAM (Deletion/duplication testing only), FH, FLCN, GATA2, GPC3, GREM1 (Promoter region deletion/duplication testing only), HOXB13 (c.251G>A, p.Gly84Glu), HRAS, KIT, MAX, MEN1, MET, MITF (c.952G>A, p.Glu318Lys variant only), MLH1, MSH2, MSH3, MSH6, MUTYH, NBN, NF1, NF2, NTHL1, PALB2, PDGFRA, PHOX2B, PMS2, POLD1, POLE, POT1, PRKAR1A, PTCH1, PTEN, RAD50, RAD51C, RAD51D, RB1, RECQL4, RET, RUNX1, SDHAF2, SDHA (sequence changes only), SDHB, SDHC, SDHD, SMAD4, SMARCA4, SMARCB1, SMARCE1, STK11, SUFU, TERC, TERT, TMEM127, TP53, TSC1, TSC2, VHL, WRN and WT1.  CURRENT THERAPY:  Observation  INTERVAL HISTORY: [name] Gibson 66 y.o. female returns for evaluation of left axillary dimpling and swelling.  She noticed this about a week ago.  She underwent left breast screening mammogram last on 02/09/2022 of her left breast that was negative, and demonstrated breast density category B.    She denies any redness, warmth, tenderness or nodules in the left breast or axilla.   She has noted some fullness in her right upper abdomen and axilla with her h/o lymphedema in that arm.    She is requesting a refill of her compazine in case she needs it.    Patient Active Problem List   Diagnosis Date Noted   Genetic testing 02/25/2018   Family history of breast cancer 02/05/2018   Family history of brain cancer 02/05/2018   Family history of melanoma 02/05/2018   History of right breast cancer 12/28/2017   Psoriasis 08/23/2013   Chronic acquired lymphedema 08/21/2013   Hypertension    Carcinoma in situ of cervix    Ovarian cyst    Pituitary microadenoma ([place])     is allergic to sulfa antibiotics.  MEDICAL HISTORY: Past Medical History:  Diagnosis Date   Arthritis    Endometrial polyp    Family history of brain cancer 02/05/2018   Family history of breast cancer 02/05/2018   Family history of melanoma 02/05/2018   Heart murmur    10-06-2019  per pt no symptoms, found by pcp, has had echo 12-24-2007 in epic   History of cancer chemotherapy    right breast completed 04-2008   History of cellulitis    05/ 2014 and 01/ 2015   History of cervical dysplasia    CIN III  s/p  cervical conization 1980s  History of external beam radiation therapy    right breast  completed 06-2008   History of pituitary adenoma    10-06-2019  per pt dx microadenoma 1990; MRI in 1993-09-04 adenoma had resolved   Hypertension    followed by pcp---  (10-06-2019  pt had normal ETT 03-02-2014 in epic   Lymphedema of right upper extremity    03-15-201 --- chronic due to hx breast mastectomy w/ node  dissections 05/ 2009   Malignant neoplasm of overlapping sites of right breast in female, estrogen receptor positive [facility]) oncologist--- dr Jana Hakim   dx 04/ 2009;  Stage IIB, multifocal/ multiple centric major component lobular w/ minor ductal component, Grade2 , ER/PR+, HER2 negative;   11-27-2007  s/p  right modified radial mastectomy with node dissections;  completed chemo 04-2008 and radiation 06-2008   Ovarian cyst    PONV (postoperative nausea and vomiting)    Psoriasis    10-06-2019 per pt currently right palm   Wears glasses     SURGICAL HISTORY: Past Surgical History:  Procedure Laterality Date   ANAL FISSURE REPAIR  1980s   CERVICAL CONIZATION W/BX  [place] N/A 10/08/2019   Procedure: [place];  Surgeon: Princess Bruins, MD;  Location: [place];  Service: Gynecology;  Laterality: N/A;   LAPAROSCOPIC BILATERAL SALPINGO OOPHERECTOMY Bilateral 10/08/2019   Procedure: LAPAROSCOPIC BILATERAL SALPINGO OOPHORECTOMY WITH PERITONEAL WASHINGS;  Surgeon: Princess Bruins, MD;  Location: [place];  Service: Gynecology;  Laterality: Bilateral;  request 9:00 OR time in [facility] Gynecology block time requests two hours   LAPAROSCOPIC CHOLECYSTECTOMY  09-07-2008  _0    MODIFIED RADICAL MASTECTOMY W/ AXILLARY LYMPH NODE DISSECTION Right 11-27-2007  dr Hassell Done _1    11-28-2007  post op evacuation hematoma right mastectomy site   TONSILLECTOMY  child    SOCIAL HISTORY: Social History   Socioeconomic History   Marital status: Married    Spouse name: Not on file   Number of children: Not on file   Years of education: Not on file   Highest education level: Not on file  Occupational History   Not on file  Tobacco Use   Smoking status: Former    Years: 4.00    Types: Cigarettes    Quit date: 10/06/1983    Years since quitting: 38.7   Smokeless tobacco: Never   Vaping Use   Vaping Use: Never used  Substance and Sexual Activity   Alcohol use: No   Drug use: Never   Sexual activity: Not on file  Other Topics Concern   Not on file  Social History Narrative   Not on file   Social Determinants of Health   Financial Resource Strain: Not on file  Food Insecurity: Not on file  Transportation Needs: Not on file  Physical Activity: Not on file  Stress: Not on file  Social Connections: Not on file  Intimate Partner Violence: Not on file    FAMILY HISTORY: Family History  Problem Relation Age of Onset   Breast cancer Cousin        09/04/22, passed away form disease   Breast cancer Cousin 83   Hypertension Mother    Melanoma Mother        bottom of foot   Diabetes Father    Hypertension Father    Heart disease Father    Parkinsonism Father    COPD Brother    Brain cancer Maternal Aunt  rare type of tumor   Cancer Maternal Aunt        type unk   Cancer Paternal Aunt        type unk    Review of Systems  Constitutional:  Negative for appetite change, chills, fatigue, fever and unexpected weight change.  HENT:   Negative for hearing loss, lump/mass and trouble swallowing.   Eyes:  Negative for eye problems and icterus.  Respiratory:  Negative for chest tightness, cough and shortness of breath.   Cardiovascular:  Negative for chest pain, leg swelling and palpitations.  Gastrointestinal:  Negative for abdominal distention, abdominal pain, constipation, diarrhea, nausea and vomiting.  Endocrine: Negative for hot flashes.  Genitourinary:  Negative for difficulty urinating.   Musculoskeletal:  Negative for arthralgias.  Skin:  Negative for itching and rash.  Neurological:  Negative for dizziness, extremity weakness, headaches and numbness.  Hematological:  Negative for adenopathy. Does not bruise/bleed easily.  Psychiatric/Behavioral:  Negative for depression. The patient is not nervous/anxious.       PHYSICAL EXAMINATION  ECOG  PERFORMANCE STATUS: 1 - Symptomatic but completely ambulatory  Vitals:   07/03/22 0840  BP: (!) 142/82  Pulse: 86  Resp: 18  Temp: 97.7 F (36.5 C)  SpO2: 99%    Physical Exam Constitutional:      General: She is not in acute distress.    Appearance: Normal appearance. She is not toxic-appearing.  HENT:     Head: Normocephalic and atraumatic.  Eyes:     General: No scleral icterus. Cardiovascular:     Rate and Rhythm: Normal rate and regular rhythm.     Pulses: Normal pulses.     Heart sounds: Normal heart sounds.  Pulmonary:     Effort: Pulmonary effort is normal.     Breath sounds: Normal breath sounds.  Chest:     Comments: Right breast s/p mastectomy, no sign of local recurrence, left breast without nodules or masses, left axillary tail is slightly swollen and dimple is noted in axilla, no nodularity or lymphadenopathy noted in the left axilla Abdominal:     General: Abdomen is flat. Bowel sounds are normal. There is no distension.     Palpations: Abdomen is soft.     Tenderness: There is no abdominal tenderness.  Musculoskeletal:        General: Swelling (right arm lymphedema) present.     Cervical back: Neck supple.  Lymphadenopathy:     Cervical: No cervical adenopathy.  Skin:    General: Skin is warm and dry.     Findings: No rash.  Neurological:     General: No focal deficit present.     Mental Status: She is alert.  Psychiatric:        Mood and Affect: Mood normal.        Behavior: Behavior normal.     LABORATORY DATA:  CBC    Component Value Date/Time   WBC 7.9 02/07/2022 0805   WBC 9.6 01/28/2020 1057   RBC 4.74 02/07/2022 0805   HGB 15.1 (H) 02/07/2022 0805   HGB 13.4 12/11/2016 1603   HCT 42.7 02/07/2022 0805   HCT 39.2 12/11/2016 1603   PLT 284 02/07/2022 0805   PLT 265 12/11/2016 1603   MCV 90.1 02/07/2022 0805   MCV 92.5 12/11/2016 1603   MCH 31.9 02/07/2022 0805   MCHC 35.4 02/07/2022 0805   RDW 12.3 02/07/2022 0805   RDW 12.7  12/11/2016 1603   LYMPHSABS 2.6 02/07/2022 0805  LYMPHSABS 3.1 12/11/2016 1603   MONOABS 0.5 02/07/2022 0805   MONOABS 0.8 12/11/2016 1603   EOSABS 0.3 02/07/2022 0805   EOSABS 0.3 12/11/2016 1603   BASOSABS 0.1 02/07/2022 0805   BASOSABS 0.1 12/11/2016 1603    CMP     Component Value Date/Time   NA 140 02/07/2022 0805   NA 141 12/11/2016 1603   K 4.1 02/07/2022 0805   K 3.5 12/11/2016 1603   CL 105 02/07/2022 0805   CL 103 12/20/2012 1054   CO2 29 02/07/2022 0805   CO2 28 12/11/2016 1603   GLUCOSE 96 02/07/2022 0805   GLUCOSE 106 12/11/2016 1603   GLUCOSE 93 12/20/2012 1054   BUN 16 02/07/2022 0805   BUN 14.1 12/11/2016 1603   CREATININE 0.73 02/07/2022 0805   CREATININE 0.8 12/11/2016 1603   CALCIUM 9.3 02/07/2022 0805   CALCIUM 8.8 12/11/2016 1603   PROT 6.9 02/07/2022 0805   PROT 6.6 12/11/2016 1603   ALBUMIN 4.0 02/07/2022 0805   ALBUMIN 3.5 12/11/2016 1603   AST 16 02/07/2022 0805   AST 16 12/11/2016 1603   ALT 16 02/07/2022 0805   ALT 16 12/11/2016 1603   ALKPHOS 98 02/07/2022 0805   ALKPHOS 89 12/11/2016 1603   BILITOT 0.4 02/07/2022 0805   BILITOT <0.22 12/11/2016 1603   GFRNONAA >60 02/07/2022 0805   GFRAA >60 01/28/2020 1057   GFRAA >60 12/26/2018 1523      ASSESSMENT and THERAPY PLAN:   History of right breast cancer [name] Gibson is a 66 year old woman with history of right-sided stage IIb ER and PR positive breast cancer breast cancer diagnosed in 2009 status post right mastectomy, adjuvant chemotherapy, adjuvant radiation, and tamoxifen for 10 years until December 2019.  [name] Gibson is here for urgent evaluation of left axillary dimpling.  Her exam does not reveal any nodules or masses but I do see the swelling in the left axillary tail and dimpling that she is concerned about.  Because this is a new change we will get a left breast mammogram and ultrasound to evaluate the axilla and breast.  I refilled her Compazine at her request.  She does different  weight loss clinical trials and sometimes gets nauseated and this helps her.  She will return in July 2024 for labs and continued long-term follow-up and surveillance.     All questions were answered. The patient knows to call the clinic with any problems, questions or concerns. We can certainly see the patient much sooner if necessary.  Total encounter time:20 minutes*in face-to-face visit time, chart review, lab review, care coordination, order entry, and documentation of the encounter time.    Wilber Bihari, NP 07/03/22 9:28 AM Medical Oncology and Hematology [facility] [place], [place] [address] Tel. [phone]    Fax. [phone]  *Total Encounter Time as defined by the Centers for Medicare and Medicaid Services includes, in addition to the face-to-face time of a patient visit (documented in the note above) non-face-to-face time: obtaining and reviewing outside history, ordering and reviewing medications, tests or procedures, care coordination (communications with other health care professionals or caregivers) and documentation in the medical record.

## 2022-07-03 NOTE — Assessment & Plan Note (Signed)
[name] Gibson is a 66 year old woman with history of right-sided stage IIb ER and PR positive breast cancer breast cancer diagnosed in 2009 status post right mastectomy, adjuvant chemotherapy, adjuvant radiation, and tamoxifen for 10 years until December 2019.  [name] Gibson is here for urgent evaluation of left axillary dimpling.  Her exam does not reveal any nodules or masses but I do see the swelling in the left axillary tail and dimpling that she is concerned about.  Because this is a new change we will get a left breast mammogram and ultrasound to evaluate the axilla and breast.  I refilled her Compazine at her request.  She does different weight loss clinical trials and sometimes gets nauseated and this helps her.  She will return in July 2024 for labs and continued long-term follow-up and surveillance.

## 2022-07-04 ENCOUNTER — Telehealth: Payer: Self-pay | Admitting: Adult Health

## 2022-07-04 NOTE — Telephone Encounter (Signed)
Scheduled appointment per 12/11. Patient is aware.

## 2022-07-11 DIAGNOSIS — R928 Other abnormal and inconclusive findings on diagnostic imaging of breast: Secondary | ICD-10-CM | POA: Diagnosis not present

## 2022-07-11 DIAGNOSIS — Z853 Personal history of malignant neoplasm of breast: Secondary | ICD-10-CM | POA: Diagnosis not present

## 2022-07-11 DIAGNOSIS — N6332 Unspecified lump in axillary tail of the left breast: Secondary | ICD-10-CM | POA: Diagnosis not present

## 2022-08-01 ENCOUNTER — Encounter: Payer: Self-pay | Admitting: Adult Health

## 2022-08-08 ENCOUNTER — Other Ambulatory Visit (HOSPITAL_COMMUNITY): Payer: Self-pay

## 2022-08-09 ENCOUNTER — Other Ambulatory Visit (HOSPITAL_COMMUNITY): Payer: Self-pay

## 2022-08-09 MED ORDER — ZOLPIDEM TARTRATE 5 MG PO TABS
2.5000 mg | ORAL_TABLET | Freq: Every evening | ORAL | 0 refills | Status: DC | PRN
  Filled 2022-08-09: qty 90, 90d supply, fill #0

## 2022-08-11 ENCOUNTER — Other Ambulatory Visit (HOSPITAL_COMMUNITY): Payer: Self-pay

## 2022-08-11 MED ORDER — LISINOPRIL 10 MG PO TABS
10.0000 mg | ORAL_TABLET | Freq: Every day | ORAL | 0 refills | Status: DC
  Filled 2022-08-11: qty 90, 90d supply, fill #0

## 2022-08-16 ENCOUNTER — Other Ambulatory Visit (HOSPITAL_COMMUNITY): Payer: Self-pay

## 2022-08-16 MED ORDER — ESCITALOPRAM OXALATE 10 MG PO TABS
10.0000 mg | ORAL_TABLET | Freq: Every day | ORAL | 0 refills | Status: DC
  Filled 2022-08-16: qty 90, 90d supply, fill #0

## 2022-10-09 DIAGNOSIS — H5203 Hypermetropia, bilateral: Secondary | ICD-10-CM | POA: Diagnosis not present

## 2022-10-09 DIAGNOSIS — H524 Presbyopia: Secondary | ICD-10-CM | POA: Diagnosis not present

## 2022-10-12 ENCOUNTER — Other Ambulatory Visit (HOSPITAL_COMMUNITY): Payer: Self-pay

## 2022-10-12 DIAGNOSIS — E669 Obesity, unspecified: Secondary | ICD-10-CM | POA: Diagnosis not present

## 2022-10-12 DIAGNOSIS — I890 Lymphedema, not elsewhere classified: Secondary | ICD-10-CM | POA: Diagnosis not present

## 2022-10-12 DIAGNOSIS — F419 Anxiety disorder, unspecified: Secondary | ICD-10-CM | POA: Diagnosis not present

## 2022-10-12 DIAGNOSIS — G4700 Insomnia, unspecified: Secondary | ICD-10-CM | POA: Diagnosis not present

## 2022-10-12 DIAGNOSIS — R051 Acute cough: Secondary | ICD-10-CM | POA: Diagnosis not present

## 2022-10-12 DIAGNOSIS — F325 Major depressive disorder, single episode, in full remission: Secondary | ICD-10-CM | POA: Diagnosis not present

## 2022-10-12 DIAGNOSIS — I10 Essential (primary) hypertension: Secondary | ICD-10-CM | POA: Diagnosis not present

## 2022-10-12 MED ORDER — LISINOPRIL 20 MG PO TABS
20.0000 mg | ORAL_TABLET | Freq: Every day | ORAL | 1 refills | Status: DC
  Filled 2022-10-12 – 2023-01-23 (×2): qty 90, 90d supply, fill #0

## 2022-10-12 MED ORDER — ZOLPIDEM TARTRATE 5 MG PO TABS
2.5000 mg | ORAL_TABLET | Freq: Every evening | ORAL | 1 refills | Status: DC | PRN
  Filled 2022-10-12 – 2022-12-07 (×2): qty 90, 90d supply, fill #0
  Filled 2023-03-12: qty 90, 90d supply, fill #1

## 2022-10-12 MED ORDER — FLUTICASONE PROPIONATE 50 MCG/ACT NA SUSP
2.0000 | Freq: Every day | NASAL | 3 refills | Status: DC
  Filled 2022-10-12: qty 48, 90d supply, fill #0

## 2022-10-12 MED ORDER — ESCITALOPRAM OXALATE 10 MG PO TABS
10.0000 mg | ORAL_TABLET | Freq: Every day | ORAL | 1 refills | Status: DC
  Filled 2022-10-12 – 2022-11-15 (×2): qty 90, 90d supply, fill #0
  Filled 2023-02-13: qty 90, 90d supply, fill #1

## 2022-11-13 ENCOUNTER — Other Ambulatory Visit: Payer: Self-pay

## 2022-11-16 ENCOUNTER — Other Ambulatory Visit: Payer: Self-pay

## 2022-12-07 ENCOUNTER — Other Ambulatory Visit: Payer: Self-pay

## 2023-01-23 ENCOUNTER — Other Ambulatory Visit: Payer: Self-pay

## 2023-02-26 ENCOUNTER — Inpatient Hospital Stay: Payer: Commercial Managed Care - PPO | Attending: Adult Health

## 2023-02-26 ENCOUNTER — Other Ambulatory Visit: Payer: Self-pay

## 2023-02-26 DIAGNOSIS — Z9011 Acquired absence of right breast and nipple: Secondary | ICD-10-CM | POA: Diagnosis not present

## 2023-02-26 DIAGNOSIS — Z923 Personal history of irradiation: Secondary | ICD-10-CM | POA: Diagnosis not present

## 2023-02-26 DIAGNOSIS — Z853 Personal history of malignant neoplasm of breast: Secondary | ICD-10-CM | POA: Diagnosis not present

## 2023-02-26 LAB — CMP (CANCER CENTER ONLY)
ALT: 15 U/L (ref 0–44)
AST: 15 U/L (ref 15–41)
Albumin: 4.1 g/dL (ref 3.5–5.0)
Alkaline Phosphatase: 95 U/L (ref 38–126)
Anion gap: 5 (ref 5–15)
BUN: 13 mg/dL (ref 8–23)
CO2: 31 mmol/L (ref 22–32)
Calcium: 9.4 mg/dL (ref 8.9–10.3)
Chloride: 105 mmol/L (ref 98–111)
Creatinine: 0.74 mg/dL (ref 0.44–1.00)
GFR, Estimated: >60 mL/min (ref >60)
Glucose, Bld: 91 mg/dL (ref 70–99)
Potassium: 3.9 mmol/L (ref 3.5–5.1)
Sodium: 141 mmol/L (ref 135–145)
Total Bilirubin: 0.6 mg/dL (ref 0.3–1.2)
Total Protein: 6.9 g/dL (ref 6.5–8.1)

## 2023-02-26 LAB — CBC WITH DIFFERENTIAL (CANCER CENTER ONLY)
Abs Immature Granulocytes: 0.01 10*3/uL (ref 0.00–0.07)
Basophils Absolute: 0.1 10*3/uL (ref 0.0–0.1)
Basophils Relative: 1 %
Eosinophils Absolute: 0.2 10*3/uL (ref 0.0–0.5)
Eosinophils Relative: 2 %
HCT: 44.8 % (ref 36.0–46.0)
Hemoglobin: 15.6 g/dL — ABNORMAL HIGH (ref 12.0–15.0)
Immature Granulocytes: 0 %
Lymphocytes Relative: 31 %
Lymphs Abs: 2.6 10*3/uL (ref 0.7–4.0)
MCH: 31.4 pg (ref 26.0–34.0)
MCHC: 34.8 g/dL (ref 30.0–36.0)
MCV: 90.1 fL (ref 80.0–100.0)
Monocytes Absolute: 0.6 10*3/uL (ref 0.1–1.0)
Monocytes Relative: 7 %
Neutro Abs: 5.1 10*3/uL (ref 1.7–7.7)
Neutrophils Relative %: 59 %
Platelet Count: 311 10*3/uL (ref 150–400)
RBC: 4.97 MIL/uL (ref 3.87–5.11)
RDW: 12.5 % (ref 11.5–15.5)
WBC Count: 8.6 10*3/uL (ref 4.0–10.5)
nRBC: 0 % (ref 0.0–0.2)

## 2023-03-05 ENCOUNTER — Encounter: Payer: Self-pay | Admitting: Adult Health

## 2023-03-05 ENCOUNTER — Inpatient Hospital Stay (HOSPITAL_BASED_OUTPATIENT_CLINIC_OR_DEPARTMENT_OTHER): Payer: Commercial Managed Care - PPO | Admitting: Adult Health

## 2023-03-05 ENCOUNTER — Other Ambulatory Visit: Payer: Self-pay

## 2023-03-05 VITALS — BP 130/66 | HR 91 | Temp 97.3°F | Resp 16 | Wt 197.0 lb

## 2023-03-05 DIAGNOSIS — Z923 Personal history of irradiation: Secondary | ICD-10-CM | POA: Diagnosis not present

## 2023-03-05 DIAGNOSIS — Z853 Personal history of malignant neoplasm of breast: Secondary | ICD-10-CM | POA: Diagnosis not present

## 2023-03-05 DIAGNOSIS — Z9011 Acquired absence of right breast and nipple: Secondary | ICD-10-CM | POA: Diagnosis not present

## 2023-03-05 NOTE — Assessment & Plan Note (Signed)
[name] Gibson is a 67 year old woman with history of right-sided stage IIb ER and PR positive breast cancer breast cancer diagnosed in 2009 status post right mastectomy, adjuvant chemotherapy, adjuvant radiation, and tamoxifen for 10 years until December 2019.  [name] has no clinical or radiographic sign of breast cancer recurrence.  Her most recent labs are stable.  She will continue to undergo annual left breast mammogram next due in December 2024.  We reviewed healthy diet, exercise, water intake and cancer screening recommendations.  [name] is done well is staying up-to-date with these.  She will continue to see her primary care provider annually.  [name] will return in 1 year for continued long-term follow-up.  We will obtain labs a week before her next visit.  She knows to call for any questions or concerns that may arise between now and that visit.

## 2023-03-05 NOTE — Progress Notes (Signed)
[facility] Cancer Follow up:    [name] Montana, MD [phone] [place] [address]   DIAGNOSIS:   SUMMARY OF ONCOLOGIC HISTORY: (1) status post right modified radical mastectomy in May 2009 for a multifocal/multicentric breast carcinoma, the major component being lobular with a minor ductal component, pathologically T2 N1, Stage IIB, grade 2, estrogen and progesterone receptor positive, HER2 negative, with a low MIB-1,   (2) treated adjuvantly with dose-dense doxorubicin/ cyclophosphamide x4 followed by weekly paclitaxel x12, completed October 2009,   (3) radiation completed on December 2009   (4) started tamoxifen December 2009.  the goal is to continue on tamoxifen for a total of 10 years, until December 2019.   (4)  lymphedema, right upper extremity   (5) genetics testing 02/04/2018             (a) Results: Negative, No pathogenic variants identified.  The date of this test report is 02/13/2018. The Multi-Cancer Panel offered by [facility] includes sequencing and/or deletion duplication testing of the following 84 genes:AIP, ALK, APC, ATM, AXIN2,BAP1,  BARD1, BLM, BMPR1A, BRCA1, BRCA2, BRIP1, CASR, CDC73, CDH1, CDK4, CDKN1B, CDKN1C, CDKN2A (p14ARF), CDKN2A (p16INK4a), CEBPA, CHEK2, CTNNA1, DICER1, DIS3L2, EGFR (c.2369C>T, p.Thr790Met variant only), EPCAM (Deletion/duplication testing only), FH, FLCN, GATA2, GPC3, GREM1 (Promoter region deletion/duplication testing only), HOXB13 (c.251G>A, p.Gly84Glu), HRAS, KIT, MAX, MEN1, MET, MITF (c.952G>A, p.Glu318Lys variant only), MLH1, MSH2, MSH3, MSH6, MUTYH, NBN, NF1, NF2, NTHL1, PALB2, PDGFRA, PHOX2B, PMS2, POLD1, POLE, POT1, PRKAR1A, PTCH1, PTEN, RAD50, RAD51C, RAD51D, RB1, RECQL4, RET, RUNX1, SDHAF2, SDHA (sequence changes only), SDHB, SDHC, SDHD, SMAD4, SMARCA4, SMARCB1, SMARCE1, STK11, SUFU, TERC, TERT, TMEM127, TP53, TSC1, TSC2, VHL, WRN and WT1.  CURRENT THERAPY: observation  INTERVAL HISTORY: [name] Gibson  67 y.o. female returns for follow-up and evaluation of her history of breast cancer.  Her most recent mammogram occurred on July 11, 2022 demonstrating no mammographic evidence of malignancy and breast density category B.  She is doing well today.  She continues to walk 5 times a week for 30 minutes each time.  She is also eating 2 cups of fruits and vegetables a day.  She notes that she does need to improve her water intake.  [name] Gibson retired earlier this year and has been enjoying retirement.  She is up-to-date with her cancer screenings and sees her primary care provider regularly.  She has right arm lymphedema that she continues to wear sleeve for.  She tells me that this is stable.   Patient Active Problem List   Diagnosis Date Noted   Genetic testing 02/25/2018   Family history of breast cancer 02/05/2018   Family history of brain cancer 02/05/2018   Family history of melanoma 02/05/2018   History of right breast cancer 12/28/2017   Psoriasis 08/23/2013   Chronic acquired lymphedema 08/21/2013   Hypertension    Carcinoma in situ of cervix    Ovarian cyst    Pituitary microadenoma (HCC)     is allergic to sulfa antibiotics.  MEDICAL HISTORY: Past Medical History:  Diagnosis Date   Arthritis    Endometrial polyp    Family history of brain cancer 02/05/2018   Family history of breast cancer 02/05/2018   Family history of melanoma 02/05/2018   Heart murmur    10-06-2019  per pt no symptoms, found by pcp, has had echo 12-24-2007 in epic   History of cancer chemotherapy    right breast completed 04-2008   History of cellulitis  05/ 04/09/13 and 01/ 2015   History of cervical dysplasia    CIN III  s/p  cervical conization 1980s   History of external beam radiation therapy    right breast  completed 06-2008   History of pituitary adenoma    10-06-2019  per pt dx microadenoma 1990; MRI in 1994/04/09 adenoma had resolved   Hypertension    followed by pcp---  (10-06-2019  pt had normal  ETT 03-02-2014 in epic   Lymphedema of right upper extremity    03-15-201 --- chronic due to hx breast mastectomy w/ node dissections 05/ 04-09-2008   Malignant neoplasm of overlapping sites of right breast in female, estrogen receptor positive [facility]) oncologist--- dr Darnelle Catalan   dx 04/ 2009;  Stage IIB, multifocal/ multiple centric major component lobular w/ minor ductal component, Grade2 , ER/PR+, HER2 negative;   11-27-2007  s/p  right modified radial mastectomy with node dissections;  completed chemo 04-2008 and radiation 06-2008   Ovarian cyst    PONV (postoperative nausea and vomiting)    Psoriasis    10-06-2019 per pt currently right palm   Wears glasses     SURGICAL HISTORY: Past Surgical History:  Procedure Laterality Date   ANAL FISSURE REPAIR  1980s   CERVICAL CONIZATION W/BX  1980s   DILATATION & CURETTAGE/HYSTEROSCOPY WITH MYOSURE N/A 10/08/2019   Procedure: DILATATION & CURETTAGE/HYSTEROSCOPY WITH MYOSURE;  Surgeon: Genia Del, MD;  Location: [facility];  Service: Gynecology;  Laterality: N/A;   LAPAROSCOPIC BILATERAL SALPINGO OOPHERECTOMY Bilateral 10/08/2019   Procedure: LAPAROSCOPIC BILATERAL SALPINGO OOPHORECTOMY WITH PERITONEAL WASHINGS;  Surgeon: Genia Del, MD;  Location: [facility];  Service: Gynecology;  Laterality: Bilateral;  request 9:00 OR time in [facility] Gynecology block time requests two hours   LAPAROSCOPIC CHOLECYSTECTOMY  09-07-2008  @[facility]    MODIFIED RADICAL MASTECTOMY W/ AXILLARY LYMPH NODE DISSECTION Right 11-27-2007  dr Daphine Deutscher @[facility]    11-28-2007  post op evacuation hematoma right mastectomy site   TONSILLECTOMY  child    SOCIAL HISTORY: Social History   Socioeconomic History   Marital status: Married    Spouse name: Not on file   Number of children: Not on file   Years of education: Not on file   Highest education level: Not on file  Occupational History   Not on file  Tobacco Use   Smoking status: Former     Current packs/day: 0.00    Types: Cigarettes    Start date: 10/06/1979    Quit date: 10/06/1983    Years since quitting: 39.4   Smokeless tobacco: Never  Vaping Use   Vaping status: Never Used  Substance and Sexual Activity   Alcohol use: No   Drug use: Never   Sexual activity: Not on file  Other Topics Concern   Not on file  Social History Narrative   Not on file   Social Determinants of Health   Financial Resource Strain: Not on file  Food Insecurity: Not on file  Transportation Needs: Not on file  Physical Activity: Not on file  Stress: Not on file  Social Connections: Not on file  Intimate Partner Violence: Not on file    FAMILY HISTORY: Family History  Problem Relation Age of Onset   Breast cancer Cousin        2023/04/10, passed away form disease   Breast cancer Cousin 54   Hypertension Mother    Melanoma Mother        bottom of foot   Diabetes  Father    Hypertension Father    Heart disease Father    Parkinsonism Father    COPD Brother    Brain cancer Maternal Aunt        rare type of tumor   Cancer Maternal Aunt        type unk   Cancer Paternal Aunt        type unk    Review of Systems  Constitutional:  Negative for appetite change, chills, fatigue, fever and unexpected weight change.  HENT:   Negative for hearing loss, lump/mass and trouble swallowing.   Eyes:  Negative for eye problems and icterus.  Respiratory:  Negative for chest tightness, cough and shortness of breath.   Cardiovascular:  Negative for chest pain, leg swelling and palpitations.  Gastrointestinal:  Negative for abdominal distention, abdominal pain, constipation, diarrhea, nausea and vomiting.  Endocrine: Negative for hot flashes.  Genitourinary:  Negative for difficulty urinating.   Musculoskeletal:  Negative for arthralgias.  Skin:  Negative for itching and rash.  Neurological:  Negative for dizziness, extremity weakness, headaches and numbness.  Hematological:  Negative for  adenopathy. Does not bruise/bleed easily.  Psychiatric/Behavioral:  Negative for depression. The patient is not nervous/anxious.       PHYSICAL EXAMINATION    Vitals:   03/05/23 0842  BP: 130/66  Pulse: 91  Resp: 16  Temp: (!) 97.3 F (36.3 C)  SpO2: 98%    Physical Exam Constitutional:      General: She is not in acute distress.    Appearance: Normal appearance. She is not toxic-appearing.  HENT:     Head: Normocephalic and atraumatic.     Mouth/Throat:     Mouth: Mucous membranes are moist.     Pharynx: Oropharynx is clear. No oropharyngeal exudate or posterior oropharyngeal erythema.  Eyes:     General: No scleral icterus. Cardiovascular:     Rate and Rhythm: Normal rate and regular rhythm.     Pulses: Normal pulses.     Heart sounds: Normal heart sounds.  Pulmonary:     Effort: Pulmonary effort is normal.     Breath sounds: Normal breath sounds.  Chest:     Comments: Right breast status postmastectomy no sign of local recurrence left breast is benign. Abdominal:     General: Abdomen is flat. Bowel sounds are normal. There is no distension.     Palpations: Abdomen is soft.     Tenderness: There is no abdominal tenderness.  Musculoskeletal:        General: Swelling (Right arm lymphedema) present.     Cervical back: Neck supple.  Lymphadenopathy:     Cervical: No cervical adenopathy.  Skin:    General: Skin is warm and dry.     Findings: No rash.  Neurological:     General: No focal deficit present.     Mental Status: She is alert.  Psychiatric:        Mood and Affect: Mood normal.        Behavior: Behavior normal.     LABORATORY DATA:  CBC    Component Value Date/Time   WBC 8.6 02/26/2023 0834   WBC 9.6 01/28/2020 1057   RBC 4.97 02/26/2023 0834   HGB 15.6 (H) 02/26/2023 0834   HGB 13.4 12/11/2016 1603   HCT 44.8 02/26/2023 0834   HCT 39.2 12/11/2016 1603   PLT 311 02/26/2023 0834   PLT 265 12/11/2016 1603   MCV 90.1 02/26/2023 0834   MCV  92.5  12/11/2016 1603   MCH 31.4 02/26/2023 0834   MCHC 34.8 02/26/2023 0834   RDW 12.5 02/26/2023 0834   RDW 12.7 12/11/2016 1603   LYMPHSABS 2.6 02/26/2023 0834   LYMPHSABS 3.1 12/11/2016 1603   MONOABS 0.6 02/26/2023 0834   MONOABS 0.8 12/11/2016 1603   EOSABS 0.2 02/26/2023 0834   EOSABS 0.3 12/11/2016 1603   BASOSABS 0.1 02/26/2023 0834   BASOSABS 0.1 12/11/2016 1603    CMP     Component Value Date/Time   NA 141 02/26/2023 0834   NA 141 12/11/2016 1603   K 3.9 02/26/2023 0834   K 3.5 12/11/2016 1603   CL 105 02/26/2023 0834   CL 103 12/20/2012 1054   CO2 31 02/26/2023 0834   CO2 28 12/11/2016 1603   GLUCOSE 91 02/26/2023 0834   GLUCOSE 106 12/11/2016 1603   GLUCOSE 93 12/20/2012 1054   BUN 13 02/26/2023 0834   BUN 14.1 12/11/2016 1603   CREATININE 0.74 02/26/2023 0834   CREATININE 0.8 12/11/2016 1603   CALCIUM 9.4 02/26/2023 0834   CALCIUM 8.8 12/11/2016 1603   PROT 6.9 02/26/2023 0834   PROT 6.6 12/11/2016 1603   ALBUMIN 4.1 02/26/2023 0834   ALBUMIN 3.5 12/11/2016 1603   AST 15 02/26/2023 0834   AST 16 12/11/2016 1603   ALT 15 02/26/2023 0834   ALT 16 12/11/2016 1603   ALKPHOS 95 02/26/2023 0834   ALKPHOS 89 12/11/2016 1603   BILITOT 0.6 02/26/2023 0834   BILITOT <0.22 12/11/2016 1603   GFRNONAA >60 02/26/2023 0834   GFRAA >60 01/28/2020 1057   GFRAA >60 12/26/2018 1523        ASSESSMENT and THERAPY PLAN:   History of right breast cancer [name] Gibson is a 67 year old woman with history of right-sided stage IIb ER and PR positive breast cancer breast cancer diagnosed in 2009 status post right mastectomy, adjuvant chemotherapy, adjuvant radiation, and tamoxifen for 10 years until December 2019.  [name] Gibson has no clinical or radiographic sign of breast cancer recurrence.  Her most recent labs are stable.  She will continue to undergo annual left breast mammogram next due in December 2024.  We reviewed healthy diet, exercise, water intake and cancer screening  recommendations.  [name] Gibson is done well is staying up-to-date with these.  She will continue to see her primary care provider annually.  [name] Gibson will return in 1 year for continued long-term follow-up.  We will obtain labs a week before her next visit.  She knows to call for any questions or concerns that may arise between now and that visit.  All questions were answered. The patient knows to call the clinic with any problems, questions or concerns. We can certainly see the patient much sooner if necessary.  Total encounter time:30 minutes*in face-to-face visit time, chart review, lab review, care coordination, order entry, and documentation of the encounter time.    Lillard Anes, NP 03/05/23 9:42 AM Medical Oncology and Hematology [facility] [address] [place], [place] [address] Tel. [phone]    Fax. [phone]  *Total Encounter Time as defined by the Centers for Medicare and Medicaid Services includes, in addition to the face-to-face time of a patient visit (documented in the note above) non-face-to-face time: obtaining and reviewing outside history, ordering and reviewing medications, tests or procedures, care coordination (communications with other health care professionals or caregivers) and documentation in the medical record.

## 2023-03-12 ENCOUNTER — Other Ambulatory Visit: Payer: Self-pay

## 2023-04-23 ENCOUNTER — Other Ambulatory Visit: Payer: Self-pay

## 2023-04-24 ENCOUNTER — Other Ambulatory Visit: Payer: Self-pay

## 2023-04-24 MED ORDER — LISINOPRIL 20 MG PO TABS
20.0000 mg | ORAL_TABLET | Freq: Every day | ORAL | 0 refills | Status: DC
  Filled 2023-04-24: qty 90, 90d supply, fill #0

## 2023-05-08 ENCOUNTER — Other Ambulatory Visit: Payer: Self-pay

## 2023-05-11 DIAGNOSIS — Z853 Personal history of malignant neoplasm of breast: Secondary | ICD-10-CM | POA: Diagnosis not present

## 2023-05-11 DIAGNOSIS — F419 Anxiety disorder, unspecified: Secondary | ICD-10-CM | POA: Diagnosis not present

## 2023-05-11 DIAGNOSIS — Z008 Encounter for other general examination: Secondary | ICD-10-CM | POA: Diagnosis not present

## 2023-05-11 DIAGNOSIS — Z87891 Personal history of nicotine dependence: Secondary | ICD-10-CM | POA: Diagnosis not present

## 2023-05-11 DIAGNOSIS — Z809 Family history of malignant neoplasm, unspecified: Secondary | ICD-10-CM | POA: Diagnosis not present

## 2023-05-11 DIAGNOSIS — Z6830 Body mass index (BMI) 30.0-30.9, adult: Secondary | ICD-10-CM | POA: Diagnosis not present

## 2023-05-11 DIAGNOSIS — E669 Obesity, unspecified: Secondary | ICD-10-CM | POA: Diagnosis not present

## 2023-05-11 DIAGNOSIS — L309 Dermatitis, unspecified: Secondary | ICD-10-CM | POA: Diagnosis not present

## 2023-05-11 DIAGNOSIS — I10 Essential (primary) hypertension: Secondary | ICD-10-CM | POA: Diagnosis not present

## 2023-05-11 DIAGNOSIS — Z8249 Family history of ischemic heart disease and other diseases of the circulatory system: Secondary | ICD-10-CM | POA: Diagnosis not present

## 2023-05-11 DIAGNOSIS — G4700 Insomnia, unspecified: Secondary | ICD-10-CM | POA: Diagnosis not present

## 2023-05-11 DIAGNOSIS — R600 Localized edema: Secondary | ICD-10-CM | POA: Diagnosis not present

## 2023-05-11 DIAGNOSIS — H269 Unspecified cataract: Secondary | ICD-10-CM | POA: Diagnosis not present

## 2023-05-14 ENCOUNTER — Other Ambulatory Visit: Payer: Self-pay

## 2023-05-14 DIAGNOSIS — I10 Essential (primary) hypertension: Secondary | ICD-10-CM | POA: Diagnosis not present

## 2023-05-14 DIAGNOSIS — E785 Hyperlipidemia, unspecified: Secondary | ICD-10-CM | POA: Diagnosis not present

## 2023-05-14 DIAGNOSIS — E559 Vitamin D deficiency, unspecified: Secondary | ICD-10-CM | POA: Diagnosis not present

## 2023-05-14 MED ORDER — ZOLPIDEM TARTRATE 5 MG PO TABS
2.5000 mg | ORAL_TABLET | Freq: Every evening | ORAL | 1 refills | Status: DC | PRN
  Filled 2023-06-08: qty 90, 90d supply, fill #0
  Filled 2023-09-05: qty 90, 90d supply, fill #1

## 2023-05-14 MED ORDER — ESCITALOPRAM OXALATE 10 MG PO TABS
10.0000 mg | ORAL_TABLET | Freq: Every day | ORAL | 1 refills | Status: DC
  Filled 2023-05-14: qty 90, 90d supply, fill #0
  Filled 2024-02-15: qty 90, 90d supply, fill #1

## 2023-05-14 MED ORDER — LISINOPRIL 20 MG PO TABS
20.0000 mg | ORAL_TABLET | Freq: Every day | ORAL | 1 refills | Status: DC
  Filled 2023-07-19: qty 90, 90d supply, fill #0
  Filled 2023-10-17: qty 90, 90d supply, fill #1

## 2023-06-01 DIAGNOSIS — H2513 Age-related nuclear cataract, bilateral: Secondary | ICD-10-CM | POA: Diagnosis not present

## 2023-06-07 ENCOUNTER — Other Ambulatory Visit: Payer: Self-pay

## 2023-06-08 ENCOUNTER — Other Ambulatory Visit: Payer: Self-pay

## 2023-06-25 ENCOUNTER — Other Ambulatory Visit: Payer: Self-pay

## 2023-06-25 MED ORDER — TRAMADOL HCL 50 MG PO TABS
50.0000 mg | ORAL_TABLET | ORAL | 0 refills | Status: DC | PRN
  Filled 2023-06-25: qty 5, 1d supply, fill #0

## 2023-06-25 MED ORDER — AMOXICILLIN 500 MG PO CAPS
500.0000 mg | ORAL_CAPSULE | Freq: Three times a day (TID) | ORAL | 0 refills | Status: DC
  Filled 2023-06-25: qty 15, 5d supply, fill #0

## 2023-07-13 DIAGNOSIS — Z1231 Encounter for screening mammogram for malignant neoplasm of breast: Secondary | ICD-10-CM | POA: Diagnosis not present

## 2023-07-16 ENCOUNTER — Encounter: Payer: Self-pay | Admitting: Adult Health

## 2023-07-20 ENCOUNTER — Other Ambulatory Visit: Payer: Self-pay

## 2023-08-07 DIAGNOSIS — H25811 Combined forms of age-related cataract, right eye: Secondary | ICD-10-CM | POA: Diagnosis not present

## 2023-08-07 DIAGNOSIS — H2511 Age-related nuclear cataract, right eye: Secondary | ICD-10-CM | POA: Diagnosis not present

## 2023-08-07 DIAGNOSIS — Z961 Presence of intraocular lens: Secondary | ICD-10-CM | POA: Diagnosis not present

## 2023-08-15 ENCOUNTER — Other Ambulatory Visit: Payer: Self-pay

## 2023-08-15 DIAGNOSIS — F419 Anxiety disorder, unspecified: Secondary | ICD-10-CM | POA: Diagnosis not present

## 2023-08-15 DIAGNOSIS — E669 Obesity, unspecified: Secondary | ICD-10-CM | POA: Diagnosis not present

## 2023-08-15 DIAGNOSIS — I890 Lymphedema, not elsewhere classified: Secondary | ICD-10-CM | POA: Diagnosis not present

## 2023-08-15 DIAGNOSIS — F325 Major depressive disorder, single episode, in full remission: Secondary | ICD-10-CM | POA: Diagnosis not present

## 2023-08-15 DIAGNOSIS — G4700 Insomnia, unspecified: Secondary | ICD-10-CM | POA: Diagnosis not present

## 2023-08-15 DIAGNOSIS — I10 Essential (primary) hypertension: Secondary | ICD-10-CM | POA: Diagnosis not present

## 2023-08-15 MED ORDER — "NEEDLE (DISP) 27G X 1/2"" MISC": 3 refills | Status: AC

## 2023-08-16 ENCOUNTER — Other Ambulatory Visit: Payer: Self-pay

## 2023-08-16 MED ORDER — ZOLPIDEM TARTRATE 5 MG PO TABS: 2.5000 mg | ORAL_TABLET | Freq: Every evening | ORAL | 1 refills | Status: DC | PRN

## 2023-08-16 MED ORDER — ESCITALOPRAM OXALATE 10 MG PO TABS
10.0000 mg | ORAL_TABLET | Freq: Every day | ORAL | 1 refills | Status: DC
  Filled 2023-08-16: qty 90, 90d supply, fill #0
  Filled 2023-11-09: qty 90, 90d supply, fill #1

## 2023-08-16 MED ORDER — LISINOPRIL 20 MG PO TABS
20.0000 mg | ORAL_TABLET | Freq: Every day | ORAL | 1 refills | Status: DC
  Filled 2023-08-16: qty 90, 90d supply, fill #0

## 2023-08-21 DIAGNOSIS — Z961 Presence of intraocular lens: Secondary | ICD-10-CM | POA: Diagnosis not present

## 2023-08-21 DIAGNOSIS — H2512 Age-related nuclear cataract, left eye: Secondary | ICD-10-CM | POA: Diagnosis not present

## 2023-08-21 DIAGNOSIS — H25812 Combined forms of age-related cataract, left eye: Secondary | ICD-10-CM | POA: Diagnosis not present

## 2023-09-06 ENCOUNTER — Other Ambulatory Visit: Payer: Self-pay

## 2023-10-15 ENCOUNTER — Other Ambulatory Visit: Payer: Self-pay

## 2023-10-15 MED ORDER — FLUOROMETHOLONE 0.1 % OP SUSP
1.0000 [drp] | Freq: Two times a day (BID) | OPHTHALMIC | 1 refills | Status: DC
  Filled 2023-10-15: qty 5, 30d supply, fill #0
  Filled 2023-11-09: qty 5, 30d supply, fill #1

## 2023-11-06 ENCOUNTER — Other Ambulatory Visit: Payer: Self-pay

## 2023-11-09 ENCOUNTER — Other Ambulatory Visit: Payer: Self-pay

## 2023-11-14 ENCOUNTER — Other Ambulatory Visit: Payer: Self-pay

## 2023-11-14 DIAGNOSIS — F419 Anxiety disorder, unspecified: Secondary | ICD-10-CM | POA: Diagnosis not present

## 2023-11-14 DIAGNOSIS — I10 Essential (primary) hypertension: Secondary | ICD-10-CM | POA: Diagnosis not present

## 2023-11-14 DIAGNOSIS — F325 Major depressive disorder, single episode, in full remission: Secondary | ICD-10-CM | POA: Diagnosis not present

## 2023-11-14 DIAGNOSIS — Z23 Encounter for immunization: Secondary | ICD-10-CM | POA: Diagnosis not present

## 2023-11-14 DIAGNOSIS — E669 Obesity, unspecified: Secondary | ICD-10-CM | POA: Diagnosis not present

## 2023-11-14 MED ORDER — ESCITALOPRAM OXALATE 10 MG PO TABS
10.0000 mg | ORAL_TABLET | Freq: Every day | ORAL | 1 refills | Status: AC
  Filled 2023-11-14 – 2024-05-15 (×2): qty 90, 90d supply, fill #0
  Filled 2024-08-11: qty 90, 90d supply, fill #1

## 2023-11-14 MED ORDER — ZOLPIDEM TARTRATE 5 MG PO TABS
5.0000 mg | ORAL_TABLET | Freq: Every evening | ORAL | 1 refills | Status: DC | PRN
  Filled 2023-11-14 – 2023-12-12 (×2): qty 90, 90d supply, fill #0
  Filled 2024-03-09: qty 90, 90d supply, fill #1

## 2023-11-14 MED ORDER — LISINOPRIL 20 MG PO TABS
20.0000 mg | ORAL_TABLET | Freq: Every day | ORAL | 1 refills | Status: DC
  Filled 2023-11-14 – 2024-01-15 (×2): qty 90, 90d supply, fill #0
  Filled 2024-04-13: qty 90, 90d supply, fill #1

## 2023-12-02 ENCOUNTER — Other Ambulatory Visit: Payer: Self-pay

## 2023-12-03 ENCOUNTER — Other Ambulatory Visit: Payer: Self-pay

## 2023-12-12 ENCOUNTER — Other Ambulatory Visit: Payer: Self-pay

## 2024-01-14 ENCOUNTER — Other Ambulatory Visit: Payer: Self-pay

## 2024-01-15 ENCOUNTER — Other Ambulatory Visit: Payer: Self-pay

## 2024-02-11 ENCOUNTER — Other Ambulatory Visit: Payer: Self-pay

## 2024-02-12 ENCOUNTER — Other Ambulatory Visit: Payer: Self-pay

## 2024-02-14 DIAGNOSIS — I10 Essential (primary) hypertension: Secondary | ICD-10-CM | POA: Diagnosis not present

## 2024-02-14 DIAGNOSIS — E669 Obesity, unspecified: Secondary | ICD-10-CM | POA: Diagnosis not present

## 2024-02-14 DIAGNOSIS — Z1211 Encounter for screening for malignant neoplasm of colon: Secondary | ICD-10-CM | POA: Diagnosis not present

## 2024-02-14 DIAGNOSIS — F325 Major depressive disorder, single episode, in full remission: Secondary | ICD-10-CM | POA: Diagnosis not present

## 2024-02-14 DIAGNOSIS — F419 Anxiety disorder, unspecified: Secondary | ICD-10-CM | POA: Diagnosis not present

## 2024-02-14 DIAGNOSIS — G4700 Insomnia, unspecified: Secondary | ICD-10-CM | POA: Diagnosis not present

## 2024-02-14 DIAGNOSIS — I890 Lymphedema, not elsewhere classified: Secondary | ICD-10-CM | POA: Diagnosis not present

## 2024-02-15 ENCOUNTER — Other Ambulatory Visit: Payer: Self-pay

## 2024-02-25 ENCOUNTER — Other Ambulatory Visit: Payer: Self-pay

## 2024-02-25 DIAGNOSIS — Z853 Personal history of malignant neoplasm of breast: Secondary | ICD-10-CM

## 2024-02-26 ENCOUNTER — Inpatient Hospital Stay: Payer: Commercial Managed Care - PPO | Attending: Adult Health

## 2024-02-26 DIAGNOSIS — Z9011 Acquired absence of right breast and nipple: Secondary | ICD-10-CM | POA: Insufficient documentation

## 2024-02-26 DIAGNOSIS — E669 Obesity, unspecified: Secondary | ICD-10-CM | POA: Diagnosis not present

## 2024-02-26 DIAGNOSIS — Z840 Family history of diseases of the skin and subcutaneous tissue: Secondary | ICD-10-CM | POA: Diagnosis not present

## 2024-02-26 DIAGNOSIS — I890 Lymphedema, not elsewhere classified: Secondary | ICD-10-CM | POA: Diagnosis not present

## 2024-02-26 DIAGNOSIS — Z853 Personal history of malignant neoplasm of breast: Secondary | ICD-10-CM | POA: Insufficient documentation

## 2024-02-26 DIAGNOSIS — Z808 Family history of malignant neoplasm of other organs or systems: Secondary | ICD-10-CM | POA: Insufficient documentation

## 2024-02-26 DIAGNOSIS — Z87891 Personal history of nicotine dependence: Secondary | ICD-10-CM | POA: Insufficient documentation

## 2024-02-26 DIAGNOSIS — R718 Other abnormality of red blood cells: Secondary | ICD-10-CM | POA: Diagnosis not present

## 2024-02-26 DIAGNOSIS — Z9221 Personal history of antineoplastic chemotherapy: Secondary | ICD-10-CM | POA: Insufficient documentation

## 2024-02-26 DIAGNOSIS — Z923 Personal history of irradiation: Secondary | ICD-10-CM | POA: Insufficient documentation

## 2024-02-26 DIAGNOSIS — Z803 Family history of malignant neoplasm of breast: Secondary | ICD-10-CM | POA: Diagnosis not present

## 2024-02-26 LAB — CMP (CANCER CENTER ONLY)
ALT: 18 U/L (ref 0–44)
AST: 15 U/L (ref 15–41)
Albumin: 4.1 g/dL (ref 3.5–5.0)
Alkaline Phosphatase: 110 U/L (ref 38–126)
Anion gap: 7 (ref 5–15)
BUN: 10 mg/dL (ref 8–23)
CO2: 31 mmol/L (ref 22–32)
Calcium: 9 mg/dL (ref 8.9–10.3)
Chloride: 106 mmol/L (ref 98–111)
Creatinine: 0.72 mg/dL (ref 0.44–1.00)
GFR, Estimated: >60 mL/min (ref >60)
Glucose, Bld: 91 mg/dL (ref 70–99)
Potassium: 3.9 mmol/L (ref 3.5–5.1)
Sodium: 144 mmol/L (ref 135–145)
Total Bilirubin: 0.4 mg/dL (ref 0.0–1.2)
Total Protein: 7 g/dL (ref 6.5–8.1)

## 2024-02-26 LAB — CBC WITH DIFFERENTIAL (CANCER CENTER ONLY)
Abs Immature Granulocytes: 0.04 K/uL (ref 0.00–0.07)
Basophils Absolute: 0.1 K/uL (ref 0.0–0.1)
Basophils Relative: 1 %
Eosinophils Absolute: 0.2 K/uL (ref 0.0–0.5)
Eosinophils Relative: 2 %
HCT: 42.8 % (ref 36.0–46.0)
Hemoglobin: 15.1 g/dL — ABNORMAL HIGH (ref 12.0–15.0)
Immature Granulocytes: 0 %
Lymphocytes Relative: 25 %
Lymphs Abs: 2.5 K/uL (ref 0.7–4.0)
MCH: 31.1 pg (ref 26.0–34.0)
MCHC: 35.3 g/dL (ref 30.0–36.0)
MCV: 88.2 fL (ref 80.0–100.0)
Monocytes Absolute: 0.7 K/uL (ref 0.1–1.0)
Monocytes Relative: 7 %
Neutro Abs: 6.7 K/uL (ref 1.7–7.7)
Neutrophils Relative %: 65 %
Platelet Count: 344 K/uL (ref 150–400)
RBC: 4.85 MIL/uL (ref 3.87–5.11)
RDW: 12.7 % (ref 11.5–15.5)
WBC Count: 10.2 K/uL (ref 4.0–10.5)
nRBC: 0 % (ref 0.0–0.2)

## 2024-02-26 LAB — VITAMIN D 25 HYDROXY (VIT D DEFICIENCY, FRACTURES): Vit D, 25-Hydroxy: 33.03 ng/mL (ref 30–100)

## 2024-02-27 LAB — CANCER ANTIGEN 27.29: CA 27.29: 28.9 U/mL (ref 0.0–38.6)

## 2024-03-03 NOTE — Progress Notes (Signed)
 [facility] Cancer Follow up:    [name] Channel, MD [phone] [address] [place] [address]   DIAGNOSIS: History of breast cancer   SUMMARY OF ONCOLOGIC HISTORY: (1) status post right modified radical mastectomy in May 2009 for a multifocal/multicentric breast carcinoma, the major component being lobular with a minor ductal component, pathologically T2 N1, Stage IIB, grade 2, estrogen and progesterone receptor positive, HER2 negative, with a low MIB-1,   (2) treated adjuvantly with dose-dense doxorubicin/ cyclophosphamide x4 followed by weekly paclitaxel x12, completed October 2009,   (3) radiation completed on December 2009   (4) started tamoxifen  December 2009.  the goal is to continue on tamoxifen  for a total of 10 years, until December 2019.   (4)  lymphedema, right upper extremity   (5) genetics testing 02/04/2018             (a) Results: Negative, No pathogenic variants identified.  The date of this test report is 02/13/2018. The Multi-Cancer Panel offered by [facility] includes sequencing and/or deletion duplication testing of the following 84 genes:AIP, ALK, APC, ATM, AXIN2,BAP1,  BARD1, BLM, BMPR1A, BRCA1, BRCA2, BRIP1, CASR, CDC73, CDH1, CDK4, CDKN1B, CDKN1C, CDKN2A (p14ARF), CDKN2A (p16INK4a), CEBPA, CHEK2, CTNNA1, DICER1, DIS3L2, EGFR (c.2369C>T, p.Thr790Met variant only), EPCAM (Deletion/duplication testing only), FH, FLCN, GATA2, GPC3, GREM1 (Promoter region deletion/duplication testing only), HOXB13 (c.251G>A, p.Gly84Glu), HRAS, KIT, MAX, MEN1, MET, MITF (c.952G>A, p.Glu318Lys variant only), MLH1, MSH2, MSH3, MSH6, MUTYH, NBN, NF1, NF2, NTHL1, PALB2, PDGFRA, PHOX2B, PMS2, POLD1, POLE, POT1, PRKAR1A, PTCH1, PTEN, RAD50, RAD51C, RAD51D, RB1, RECQL4, RET, RUNX1, SDHAF2, SDHA (sequence changes only), SDHB, SDHC, SDHD, SMAD4, SMARCA4, SMARCB1, SMARCE1, STK11, SUFU, TERC, TERT, TMEM127, TP53, TSC1, TSC2, VHL, WRN and WT1.  CURRENT THERAPY: observation  INTERVAL  HISTORY:  Discussed the use of AI scribe software for clinical note transcription with the patient, who gave verbal consent to proceed.  History of Present Illness [name] Gibson is a 68 year old female with a history of stage 2B right breast invasive ductal carcinoma who presents for follow-up.  She was diagnosed with stage 2B right breast invasive ductal carcinoma, ER, PR positive, in 2009. She underwent a mastectomy, adjuvant chemotherapy, adjuvant radiation, and completed 10 years of tamoxifen  therapy in 2019. She continues annual left breast screening mammograms, with the latest in December 2024.  She manages lymphedema with a 30 to 40 mmHg compression sleeve, alternating them as recommended, and plans to reorder them through her previous supplier.  Her hemoglobin level is slightly elevated at 15.1. She acknowledges fluctuating water intake and aims to improve it.  She has received vaccinations for pneumonia, flu, COVID, shingles, and DTAP. She reports no new pain, cough, or shortness of breath.    Patient Active Problem List   Diagnosis Date Noted   Genetic testing 02/25/2018   Family history of breast cancer 02/05/2018   Family history of brain cancer 02/05/2018   Family history of melanoma 02/05/2018   History of right breast cancer 12/28/2017   Psoriasis 08/23/2013   Chronic acquired lymphedema 08/21/2013   Hypertension    Carcinoma in situ of cervix    Ovarian cyst    Pituitary microadenoma (HCC)     is allergic to sulfa antibiotics.  MEDICAL HISTORY: Past Medical History:  Diagnosis Date   Arthritis    Endometrial polyp    Family history of brain cancer 02/05/2018   Family history of breast cancer 02/05/2018   Family history of melanoma 02/05/2018   Heart murmur  10-06-2019  per pt no symptoms, found by pcp, has had echo 12-24-2007 in epic   History of cancer chemotherapy    right breast completed 04-2008   History of cellulitis    05/ 2014 and 01/ 2015    History of cervical dysplasia    CIN III  s/p  cervical conization 1980s   History of external beam radiation therapy    right breast  completed 06-2008   History of pituitary adenoma    10-06-2019  per pt dx microadenoma 1990; MRI in 1995 adenoma had resolved   Hypertension    followed by pcp---  (10-06-2019  pt had normal ETT 03-02-2014 in epic   Lymphedema of right upper extremity    03-15-201 --- chronic due to hx breast mastectomy w/ node dissections 05/ 2009   Malignant neoplasm of overlapping sites of right breast in female, estrogen receptor positive [facility]) oncologist--- dr layla   dx 04/ 2009;  Stage IIB, multifocal/ multiple centric major component lobular w/ minor ductal component, Grade2 , ER/PR+, HER2 negative;   11-27-2007  s/p  right modified radial mastectomy with node dissections;  completed chemo 04-2008 and radiation 06-2008   Ovarian cyst    PONV (postoperative nausea and vomiting)    Psoriasis    10-06-2019 per pt currently right palm   Wears glasses     SURGICAL HISTORY: Past Surgical History:  Procedure Laterality Date   ANAL FISSURE REPAIR  1980s   CERVICAL CONIZATION W/BX  1980s   DILATATION & CURETTAGE/HYSTEROSCOPY WITH MYOSURE N/A 10/08/2019   Procedure: DILATATION & CURETTAGE/HYSTEROSCOPY WITH MYOSURE;  Surgeon: Lavoie, Marie-Lyne, MD;  Location: [facility];  Service: Gynecology;  Laterality: N/A;   LAPAROSCOPIC BILATERAL SALPINGO OOPHERECTOMY Bilateral 10/08/2019   Procedure: LAPAROSCOPIC BILATERAL SALPINGO OOPHORECTOMY WITH PERITONEAL WASHINGS;  Surgeon: Lavoie, Marie-Lyne, MD;  Location: [facility];  Service: Gynecology;  Laterality: Bilateral;  request 9:00 OR time in [facility] Gynecology block time requests two hours   LAPAROSCOPIC CHOLECYSTECTOMY  09-07-2008  @[facility]    MODIFIED RADICAL MASTECTOMY W/ AXILLARY LYMPH NODE DISSECTION Right 11-27-2007  dr gladis @[facility]    11-28-2007  post op evacuation hematoma right mastectomy site    TONSILLECTOMY  child    SOCIAL HISTORY: Social History   Socioeconomic History   Marital status: Married    Spouse name: Not on file   Number of children: Not on file   Years of education: Not on file   Highest education level: Not on file  Occupational History   Not on file  Tobacco Use   Smoking status: Former    Current packs/day: 0.00    Types: Cigarettes    Start date: 10/06/1979    Quit date: 10/06/1983    Years since quitting: 40.4   Smokeless tobacco: Never  Vaping Use   Vaping status: Never Used  Substance and Sexual Activity   Alcohol use: No   Drug use: Never   Sexual activity: Not on file  Other Topics Concern   Not on file  Social History Narrative   Not on file   Social Drivers of Health   Financial Resource Strain: Not on file  Food Insecurity: Not on file  Transportation Needs: Not on file  Physical Activity: Not on file  Stress: Not on file  Social Connections: Not on file  Intimate Partner Violence: Not on file    FAMILY HISTORY: Family History  Problem Relation Age of Onset   Breast cancer Cousin  40's, passed away form disease   Breast cancer Cousin 45   Hypertension Mother    Melanoma Mother        bottom of foot   Diabetes Father    Hypertension Father    Heart disease Father    Parkinsonism Father    COPD Brother    Brain cancer Maternal Aunt        rare type of tumor   Cancer Maternal Aunt        type unk   Cancer Paternal Aunt        type unk    Review of Systems  Constitutional:  Negative for appetite change, chills, fatigue, fever and unexpected weight change.  HENT:   Negative for hearing loss, lump/mass and trouble swallowing.   Eyes:  Negative for eye problems and icterus.  Respiratory:  Negative for chest tightness, cough and shortness of breath.   Cardiovascular:  Negative for chest pain, leg swelling and palpitations.  Gastrointestinal:  Negative for abdominal distention, abdominal pain, constipation,  diarrhea, nausea and vomiting.  Endocrine: Negative for hot flashes.  Genitourinary:  Negative for difficulty urinating.   Musculoskeletal:  Negative for arthralgias.  Skin:  Negative for itching and rash.  Neurological:  Negative for dizziness, extremity weakness, headaches and numbness.  Hematological:  Negative for adenopathy. Does not bruise/bleed easily.  Psychiatric/Behavioral:  Negative for depression. The patient is not nervous/anxious.       PHYSICAL EXAMINATION   Onc Performance Status - 03/04/24 1003       ECOG Perf Status   ECOG Perf Status Restricted in physically strenuous activity but ambulatory and able to carry out work of a light or sedentary nature, e.g., light house work, office work      KPS SCALE   KPS % SCORE Able to carry on normal activity, minor s/s of disease          Vitals:   03/04/24 0956  BP: 132/83  Pulse: 75  Resp: 16  Temp: 97.7 F (36.5 C)  SpO2: 98%    Physical Exam Constitutional:      General: She is not in acute distress.    Appearance: Normal appearance. She is not toxic-appearing.  HENT:     Head: Normocephalic and atraumatic.     Mouth/Throat:     Mouth: Mucous membranes are moist.     Pharynx: Oropharynx is clear. No oropharyngeal exudate or posterior oropharyngeal erythema.  Eyes:     General: No scleral icterus. Cardiovascular:     Rate and Rhythm: Normal rate and regular rhythm.     Pulses: Normal pulses.     Heart sounds: Normal heart sounds.  Pulmonary:     Effort: Pulmonary effort is normal.     Breath sounds: Normal breath sounds.  Chest:     Comments: Right breast s/p mastectomy, no sign of local recurrence, left breast benign Abdominal:     General: Abdomen is flat. Bowel sounds are normal. There is no distension.     Palpations: Abdomen is soft.     Tenderness: There is no abdominal tenderness.  Musculoskeletal:        General: No swelling.     Cervical back: Neck supple.  Lymphadenopathy:      Cervical: No cervical adenopathy.     Upper Body:     Right upper body: No supraclavicular or axillary adenopathy.     Left upper body: No supraclavicular or axillary adenopathy.  Skin:    General: Skin is  warm and dry.     Findings: No rash.  Neurological:     General: No focal deficit present.     Mental Status: She is alert.  Psychiatric:        Mood and Affect: Mood normal.        Behavior: Behavior normal.     LABORATORY DATA:  CBC    Component Value Date/Time   WBC 10.2 02/26/2024 0854   WBC 9.6 01/28/2020 1057   RBC 4.85 02/26/2024 0854   HGB 15.1 (H) 02/26/2024 0854   HGB 13.4 12/11/2016 1603   HCT 42.8 02/26/2024 0854   HCT 39.2 12/11/2016 1603   PLT 344 02/26/2024 0854   PLT 265 12/11/2016 1603   MCV 88.2 02/26/2024 0854   MCV 92.5 12/11/2016 1603   MCH 31.1 02/26/2024 0854   MCHC 35.3 02/26/2024 0854   RDW 12.7 02/26/2024 0854   RDW 12.7 12/11/2016 1603   LYMPHSABS 2.5 02/26/2024 0854   LYMPHSABS 3.1 12/11/2016 1603   MONOABS 0.7 02/26/2024 0854   MONOABS 0.8 12/11/2016 1603   EOSABS 0.2 02/26/2024 0854   EOSABS 0.3 12/11/2016 1603   BASOSABS 0.1 02/26/2024 0854   BASOSABS 0.1 12/11/2016 1603    CMP     Component Value Date/Time   NA 144 02/26/2024 0854   NA 141 12/11/2016 1603   K 3.9 02/26/2024 0854   K 3.5 12/11/2016 1603   CL 106 02/26/2024 0854   CL 103 12/20/2012 1054   CO2 31 02/26/2024 0854   CO2 28 12/11/2016 1603   GLUCOSE 91 02/26/2024 0854   GLUCOSE 106 12/11/2016 1603   GLUCOSE 93 12/20/2012 1054   BUN 10 02/26/2024 0854   BUN 14.1 12/11/2016 1603   CREATININE 0.72 02/26/2024 0854   CREATININE 0.8 12/11/2016 1603   CALCIUM  9.0 02/26/2024 0854   CALCIUM  8.8 12/11/2016 1603   PROT 7.0 02/26/2024 0854   PROT 6.6 12/11/2016 1603   ALBUMIN 4.1 02/26/2024 0854   ALBUMIN 3.5 12/11/2016 1603   AST 15 02/26/2024 0854   AST 16 12/11/2016 1603   ALT 18 02/26/2024 0854   ALT 16 12/11/2016 1603   ALKPHOS 110 02/26/2024 0854    ALKPHOS 89 12/11/2016 1603   BILITOT 0.4 02/26/2024 0854   BILITOT <0.22 12/11/2016 1603   GFRNONAA >60 02/26/2024 0854   GFRAA >60 01/28/2020 1057   GFRAA >60 12/26/2018 1523     ASSESSMENT and THERAPY PLAN:   Assessment and Plan Assessment & Plan History of right breast invasive ductal carcinoma, stage 2B, ER/PR+, status post mastectomy, adjuvant chemotherapy, radiation, and antiestrogen therapy Stage 2B right breast invasive ductal carcinoma, ER/PR positive, diagnosed in 2009. Completed 10 years of tamoxifen  in 2019. Recent left breast mammogram showed no malignancy. No signs of recurrence. - Continue annual left breast screening mammogram in December. - Encourage a diet rich in fruits and vegetables.  Lymphedema of right upper extremity Lymphedema managed with compression sleeves. Current sleeves need replacement. - Provide order for 30-40 mmHg compression sleeves. - Instruct to obtain new sleeves from previous supplier.  Obesity Current weight 212 pounds. On tirzepatide. Engaged in physical activity. Discussed muscle mass maintenance and dietary considerations. - Continue semaglutide therapy. - Encourage regular physical activity including strength training and aerobic exercises. - Advise on dietary modifications to increase fruit and vegetable intake, possibly through smoothies.  Elevated hemoglobin Hemoglobin 15.1, slightly above normal. Possible dehydration. Discussed individual normal ranges. - Monitor hemoglobin levels. - Encourage adequate hydration.   RTC in  1 year for continued long term follow-up   All questions were answered. The patient knows to call the clinic with any problems, questions or concerns. We can certainly see the patient much sooner if necessary.  Total encounter time:20 minutes*in face-to-face visit time, chart review, lab review, care coordination, order entry, and documentation of the encounter time.    Morna Kendall, NP 03/04/24 3:28  PM Medical Oncology and Hematology [facility] [address] [place], [place] [address] Tel. [phone]    Fax. [phone]  *Total Encounter Time as defined by the Centers for Medicare and Medicaid Services includes, in addition to the face-to-face time of a patient visit (documented in the note above) non-face-to-face time: obtaining and reviewing outside history, ordering and reviewing medications, tests or procedures, care coordination (communications with other health care professionals or caregivers) and documentation in the medical record.

## 2024-03-04 ENCOUNTER — Encounter: Payer: Self-pay | Admitting: Adult Health

## 2024-03-04 ENCOUNTER — Inpatient Hospital Stay: Payer: Commercial Managed Care - PPO | Admitting: Adult Health

## 2024-03-04 VITALS — BP 132/83 | HR 75 | Temp 97.7°F | Resp 16 | Ht 66.0 in | Wt 212.7 lb

## 2024-03-04 DIAGNOSIS — Z853 Personal history of malignant neoplasm of breast: Secondary | ICD-10-CM | POA: Diagnosis not present

## 2024-03-10 ENCOUNTER — Other Ambulatory Visit: Payer: Self-pay

## 2024-04-24 DIAGNOSIS — Z1211 Encounter for screening for malignant neoplasm of colon: Secondary | ICD-10-CM | POA: Diagnosis not present

## 2024-04-24 DIAGNOSIS — Z1212 Encounter for screening for malignant neoplasm of rectum: Secondary | ICD-10-CM | POA: Diagnosis not present

## 2024-05-06 LAB — COLOGUARD: COLOGUARD: NEGATIVE

## 2024-05-15 ENCOUNTER — Other Ambulatory Visit: Payer: Self-pay

## 2024-05-23 ENCOUNTER — Other Ambulatory Visit: Payer: Self-pay

## 2024-05-23 DIAGNOSIS — Z124 Encounter for screening for malignant neoplasm of cervix: Secondary | ICD-10-CM | POA: Diagnosis not present

## 2024-05-23 DIAGNOSIS — R87610 Atypical squamous cells of undetermined significance on cytologic smear of cervix (ASC-US): Secondary | ICD-10-CM | POA: Diagnosis not present

## 2024-05-23 DIAGNOSIS — I10 Essential (primary) hypertension: Secondary | ICD-10-CM | POA: Diagnosis not present

## 2024-05-23 MED ORDER — LISINOPRIL-HYDROCHLOROTHIAZIDE 20-12.5 MG PO TABS
1.0000 | ORAL_TABLET | Freq: Every day | ORAL | 1 refills | Status: DC
  Filled 2024-05-23: qty 90, 90d supply, fill #0
  Filled 2024-08-19: qty 90, 90d supply, fill #1

## 2024-05-23 MED ORDER — ESCITALOPRAM OXALATE 10 MG PO TABS: 10.0000 mg | ORAL_TABLET | Freq: Every day | ORAL | 1 refills | Status: AC

## 2024-05-23 MED ORDER — ZOLPIDEM TARTRATE 5 MG PO TABS
2.5000 mg | ORAL_TABLET | Freq: Every evening | ORAL | 1 refills | Status: AC | PRN
  Filled 2024-05-31 – 2024-06-11 (×2): qty 90, 90d supply, fill #0

## 2024-06-01 ENCOUNTER — Other Ambulatory Visit: Payer: Self-pay

## 2024-06-04 ENCOUNTER — Other Ambulatory Visit: Payer: Self-pay

## 2024-06-11 ENCOUNTER — Other Ambulatory Visit: Payer: Self-pay

## 2024-07-07 ENCOUNTER — Other Ambulatory Visit: Payer: Self-pay | Admitting: *Deleted

## 2024-07-07 ENCOUNTER — Encounter: Payer: Self-pay | Admitting: Physical Therapy

## 2024-07-07 DIAGNOSIS — Z853 Personal history of malignant neoplasm of breast: Secondary | ICD-10-CM

## 2024-07-10 NOTE — Therapy (Signed)
 OUTPATIENT PHYSICAL THERAPY  UPPER EXTREMITY ONCOLOGY EVALUATION  Patient Name: [name] Gibson MRN: [id number] DOB:[birth year], 68 y.o., female Today's Date: 07/10/2024  END OF SESSION:   Past Medical History:  Diagnosis Date   Arthritis    Endometrial polyp    Family history of brain cancer 02/05/2018   Family history of breast cancer 02/05/2018   Family history of melanoma 02/05/2018   Heart murmur    10-06-2019  per pt no symptoms, found by pcp, has had echo 12-24-2007 in epic   History of cancer chemotherapy    right breast completed 04-2008   History of cellulitis    05/ 2014 and 01/ 2015   History of cervical dysplasia    CIN III  s/p  cervical conization 1980s   History of external beam radiation therapy    right breast  completed 06-2008   History of pituitary adenoma    10-06-2019  per pt dx microadenoma 1990; MRI in 1995 adenoma had resolved   Hypertension    followed by pcp---  (10-06-2019  pt had normal ETT 03-02-2014 in epic   Lymphedema of right upper extremity    03-15-201 --- chronic due to hx breast mastectomy w/ node dissections 05/ 2009   Malignant neoplasm of overlapping sites of right breast in female, estrogen receptor positive [facility]) oncologist--- dr layla   dx 04/ 2009;  Stage IIB, multifocal/ multiple centric major component lobular w/ minor ductal component, Grade2 , ER/PR+, HER2 negative;   11-27-2007  s/p  right modified radial mastectomy with node dissections;  completed chemo 04-2008 and radiation 06-2008   Ovarian cyst    PONV (postoperative nausea and vomiting)    Psoriasis    10-06-2019 per pt currently right palm   Wears glasses    Past Surgical History:  Procedure Laterality Date   ANAL FISSURE REPAIR  1980s   CERVICAL CONIZATION W/BX  1980s   DILATATION & CURETTAGE/HYSTEROSCOPY WITH MYOSURE N/A 10/08/2019   Procedure: DILATATION & CURETTAGE/HYSTEROSCOPY WITH MYOSURE;  Surgeon: Lavoie, Marie-Lyne, MD;  Location: [facility];  Service: Gynecology;  Laterality: N/A;   LAPAROSCOPIC BILATERAL SALPINGO OOPHERECTOMY Bilateral 10/08/2019   Procedure: LAPAROSCOPIC BILATERAL SALPINGO OOPHORECTOMY WITH PERITONEAL WASHINGS;  Surgeon: Lavoie, Marie-Lyne, MD;  Location: [facility] [place];  Service: Gynecology;  Laterality: Bilateral;  request 9:00 OR time in [facility] Gynecology block time requests two hours   LAPAROSCOPIC CHOLECYSTECTOMY  09-07-2008  @[facility]    MODIFIED RADICAL MASTECTOMY W/ AXILLARY LYMPH NODE DISSECTION Right 11-27-2007  dr gladis @[facility]    11-28-2007  post op evacuation hematoma right mastectomy site   TONSILLECTOMY  child   Patient Active Problem List   Diagnosis Date Noted   Genetic testing 02/25/2018   Family history of breast cancer 02/05/2018   Family history of brain cancer 02/05/2018   Family history of melanoma 02/05/2018   History of right breast cancer 12/28/2017   Psoriasis 08/23/2013   Chronic acquired lymphedema 08/21/2013   Hypertension    Carcinoma in situ of cervix    Ovarian cyst    Pituitary microadenoma (HCC)     PCP: Montie Pizza, MD  REFERRING PROVIDER: Morna Kendall, NP  REFERRING DIAG: I89.0  THERAPY DIAG:  No diagnosis found.  ONSET DATE: 2009  Rationale for Evaluation and Treatment: Rehabilitation  SUBJECTIVE:  SUBJECTIVE STATEMENT: ***  PERTINENT HISTORY: Right breast cancer diagnosed in May 2009. Had mastectomy, chemo, and radiation; is on tamoxifen  for 9 years. HTN controlled with meds. Lymphedema started about one year out from treatment; she has been treated here on more than one occasion in the past.   PAIN:  Are you having pain? {yes/no:20286} NPRS scale: ***/10 Pain location: *** Pain orientation: {Pain Orientation:25161}  PAIN TYPE: {type:313116} Pain  description: {PAIN DESCRIPTION:21022940}  Aggravating factors: *** Relieving factors: ***  PRECAUTIONS: Rt UE lymphedema   RED FLAGS: None   WEIGHT BEARING RESTRICTIONS: No  FALLS:  Has patient fallen in last 6 months? {fallsyesno:27318}  LIVING ENVIRONMENT: Lives with: {[facility] lives with:25569::lives with their family} Lives in: {Lives in:25570} Stairs: {yes/no:20286}; {Stairs:24000} Has following equipment at home: {Assistive devices:23999}  OCCUPATION: ***  LEISURE: ***  HAND DOMINANCE: {RIGHT/LEFT:21944}   PRIOR LEVEL OF FUNCTION: {PLOF:24004}  PATIENT GOALS: ***   OBJECTIVE: Note: Objective measures were completed at Evaluation unless otherwise noted.  COGNITION: Overall cognitive status: Within functional limits for tasks assessed   PALPATION: ***  OBSERVATIONS / OTHER ASSESSMENTS: ***  LYMPHEDEMA ASSESSMENTS:   LANDMARK RIGHT  Prior visits 2019  At axilla    15 cm proximal to the proximal aspect of the olecranon process 51.5  10 cm proximal to the proximal aspect of the olecranon process 51.1  Olecranon process 41.1  15 cm proximal to the proximal aspect of the ulnar styloid process 45.2  10 cm proximal to the proximal aspect of the ulnar styloid process 42  Just distal to the ulnar styloid process 28.5  Across hand at thumb web space 25  At base of 2nd digit 7.7  (Blank rows = not tested)  LANDMARK LEFT  2019  At axilla    15 cm proximal to the proximal aspect of the olecranon process 40.3  10 cm proximal to the proximal aspect of the olecranon process 37.7  Olecranon process 29.3  15 cm proximal to the proximal aspect of the ulnar styloid process 29.2  10 cm proximal to  the proximal aspect of the ulnar styloid process 27  Just distal to the ulnar styloid process 19.3  Across hand at thumb web space 20.8  At base of 2nd digit 6.6  (Blank rows = not tested)                                                                                                                             TREATMENT DATE:  07/11/24 Eval performed    PATIENT EDUCATION:  Education details: engineer, building services Person educated: Patient Education method: Explanation Education comprehension: verbalized understanding   ASSESSMENT:  CLINICAL IMPRESSION: Patient is a 68 y.o. female who was seen today for physical therapy evaluation and treatment for lymphedema garment prescription and ordering.  She is known to this clinic had has had previous ongoing success with custom JOB[employer] elvarex garments in class ?SABRA  Pt would continue to benefit from  a custom flat knit garment with .    OBJECTIVE IMPAIRMENTS: decreased knowledge of use of DME and increased edema.   ACTIVITY LIMITATIONS: none  PARTICIPATION LIMITATIONS: none  PERSONAL FACTORS: Time since onset of injury/illness/exacerbation are also affecting patient's functional outcome.   REHAB POTENTIAL: Excellent  CLINICAL DECISION MAKING: Stable/uncomplicated  EVALUATION COMPLEXITY: Low  GOALS: Goals reviewed with patient? Yes  SHORT TERM GOALS: Target date: 07/11/24  Pt will be assessed for garment effectiveness and size changes since past visits with new garment recommendation Baseline: Goal status: MET   PLAN:  PT FREQUENCY: one time visit  PT DURATION: 1 week  PLANNED INTERVENTIONS: {rehab planned interventions:25118::Patient/Family education,Balance training,Joint mobilization,Therapeutic exercises,Therapeutic activity,Neuromuscular re-education,Gait training,Self Care}  PLAN FOR NEXT SESSION: PIERRETTE Larue Saddie JONELLE, PT 07/10/2024, 9:18 PM

## 2024-07-11 ENCOUNTER — Ambulatory Visit: Attending: Adult Health | Admitting: Rehabilitation

## 2024-07-11 ENCOUNTER — Encounter: Payer: Self-pay | Admitting: Rehabilitation

## 2024-07-11 DIAGNOSIS — I890 Lymphedema, not elsewhere classified: Secondary | ICD-10-CM | POA: Insufficient documentation

## 2024-07-11 DIAGNOSIS — Z853 Personal history of malignant neoplasm of breast: Secondary | ICD-10-CM | POA: Diagnosis present

## 2024-07-15 DIAGNOSIS — Z0100 Encounter for examination of eyes and vision without abnormal findings: Secondary | ICD-10-CM | POA: Diagnosis not present

## 2024-07-18 DIAGNOSIS — Z1231 Encounter for screening mammogram for malignant neoplasm of breast: Secondary | ICD-10-CM | POA: Diagnosis not present

## 2024-07-18 DIAGNOSIS — Z780 Asymptomatic menopausal state: Secondary | ICD-10-CM | POA: Diagnosis not present

## 2024-08-22 ENCOUNTER — Other Ambulatory Visit: Payer: Self-pay

## 2024-08-22 MED ORDER — ESCITALOPRAM OXALATE 10 MG PO TABS
10.0000 mg | ORAL_TABLET | Freq: Every day | ORAL | 1 refills | Status: AC
  Filled 2024-08-22: qty 90, 90d supply, fill #0

## 2024-08-22 MED ORDER — LISINOPRIL-HYDROCHLOROTHIAZIDE 20-12.5 MG PO TABS
1.0000 | ORAL_TABLET | Freq: Every day | ORAL | 1 refills | Status: AC
  Filled 2024-08-22: qty 90, 90d supply, fill #0

## 2024-08-22 MED ORDER — ZOLPIDEM TARTRATE 5 MG PO TABS
ORAL_TABLET | ORAL | 1 refills | Status: AC
  Filled 2024-08-22: qty 90, 90d supply, fill #0

## 2025-02-25 ENCOUNTER — Other Ambulatory Visit

## 2025-03-04 ENCOUNTER — Encounter: Admitting: Adult Health
